# Patient Record
Sex: Male | Born: 1950 | Race: White | Hispanic: No | Marital: Married | Smoking: Never smoker
Health system: Southern US, Community
[De-identification: ages and names within clinical notes are randomized; demographics above are authoritative.]

## PROBLEM LIST (undated history)

## (undated) ENCOUNTER — Ambulatory Visit: Admission: EM | Source: Home / Self Care

## (undated) DIAGNOSIS — Z860100 Personal history of colon polyps, unspecified: Secondary | ICD-10-CM

## (undated) DIAGNOSIS — Z8619 Personal history of other infectious and parasitic diseases: Secondary | ICD-10-CM

## (undated) DIAGNOSIS — Z87442 Personal history of urinary calculi: Secondary | ICD-10-CM

## (undated) DIAGNOSIS — E119 Type 2 diabetes mellitus without complications: Secondary | ICD-10-CM

## (undated) DIAGNOSIS — Q5310 Unspecified undescended testicle, unilateral: Secondary | ICD-10-CM

## (undated) DIAGNOSIS — R892 Abnormal level of other drugs, medicaments and biological substances in specimens from other organs, systems and tissues: Secondary | ICD-10-CM

## (undated) DIAGNOSIS — G629 Polyneuropathy, unspecified: Secondary | ICD-10-CM

## (undated) DIAGNOSIS — M1711 Unilateral primary osteoarthritis, right knee: Secondary | ICD-10-CM

## (undated) DIAGNOSIS — N529 Male erectile dysfunction, unspecified: Secondary | ICD-10-CM

## (undated) DIAGNOSIS — E785 Hyperlipidemia, unspecified: Secondary | ICD-10-CM

## (undated) DIAGNOSIS — E039 Hypothyroidism, unspecified: Secondary | ICD-10-CM

## (undated) DIAGNOSIS — I444 Left anterior fascicular block: Secondary | ICD-10-CM

## (undated) DIAGNOSIS — K219 Gastro-esophageal reflux disease without esophagitis: Secondary | ICD-10-CM

## (undated) HISTORY — DX: Personal history of other infectious and parasitic diseases: Z86.19

## (undated) HISTORY — DX: Abnormal level of other drugs, medicaments and biological substances in specimens from other organs, systems and tissues: R89.2

## (undated) HISTORY — DX: Hyperlipidemia, unspecified: E78.5

## (undated) HISTORY — PX: MENISCUS REPAIR: SHX5179

## (undated) HISTORY — DX: Type 2 diabetes mellitus without complications: E11.9

## (undated) HISTORY — DX: Hypothyroidism, unspecified: E03.9

## (undated) HISTORY — DX: Personal history of colon polyps, unspecified: Z86.0100

## (undated) HISTORY — DX: Polyneuropathy, unspecified: G62.9

## (undated) HISTORY — DX: Unspecified undescended testicle, unilateral: Q53.10

## (undated) HISTORY — DX: Personal history of colonic polyps: Z86.010

## (undated) HISTORY — PX: COLONOSCOPY: SHX174

---
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
## 2000-09-15 HISTORY — PX: MENISCUS REPAIR: SHX5179

## 2005-05-02 ENCOUNTER — Ambulatory Visit: Payer: Self-pay | Admitting: Orthopaedic Surgery

## 2005-06-20 ENCOUNTER — Other Ambulatory Visit: Payer: Self-pay

## 2005-06-27 ENCOUNTER — Ambulatory Visit: Payer: Self-pay | Admitting: Orthopaedic Surgery

## 2006-05-06 ENCOUNTER — Emergency Department: Payer: Self-pay | Admitting: Emergency Medicine

## 2006-05-10 ENCOUNTER — Emergency Department: Payer: Self-pay | Admitting: Emergency Medicine

## 2006-09-15 HISTORY — PX: MENISCUS REPAIR: SHX5179

## 2008-07-10 ENCOUNTER — Ambulatory Visit: Payer: Self-pay | Admitting: Gastroenterology

## 2008-12-29 ENCOUNTER — Ambulatory Visit: Payer: Self-pay | Admitting: Specialist

## 2009-01-04 ENCOUNTER — Ambulatory Visit: Payer: Self-pay | Admitting: Specialist

## 2013-10-11 LAB — FOLATE

## 2013-10-11 LAB — VITAMIN B12: VITAMIN B12 DEFICIENCY PANEL: 572

## 2013-10-16 LAB — HM DIABETES EYE EXAM

## 2013-10-26 ENCOUNTER — Ambulatory Visit (INDEPENDENT_AMBULATORY_CARE_PROVIDER_SITE_OTHER): Payer: Medicare HMO | Admitting: Endocrinology

## 2013-10-26 ENCOUNTER — Encounter: Payer: Self-pay | Admitting: Endocrinology

## 2013-10-26 VITALS — BP 116/70 | HR 48 | Temp 98.3°F | Ht 64.5 in | Wt 178.0 lb

## 2013-10-26 DIAGNOSIS — E1169 Type 2 diabetes mellitus with other specified complication: Secondary | ICD-10-CM | POA: Insufficient documentation

## 2013-10-26 DIAGNOSIS — M25569 Pain in unspecified knee: Secondary | ICD-10-CM

## 2013-10-26 DIAGNOSIS — E1165 Type 2 diabetes mellitus with hyperglycemia: Secondary | ICD-10-CM

## 2013-10-26 DIAGNOSIS — E119 Type 2 diabetes mellitus without complications: Secondary | ICD-10-CM

## 2013-10-26 DIAGNOSIS — E039 Hypothyroidism, unspecified: Secondary | ICD-10-CM

## 2013-10-26 LAB — BASIC METABOLIC PANEL
BUN: 17 mg/dL (ref 6–23)
CO2: 29 mEq/L (ref 19–32)
CREATININE: 0.76 mg/dL (ref 0.50–1.35)
Calcium: 9.8 mg/dL (ref 8.4–10.5)
Chloride: 104 mEq/L (ref 96–112)
GLUCOSE: 124 mg/dL — AB (ref 70–99)
POTASSIUM: 4.7 meq/L (ref 3.5–5.3)
Sodium: 140 mEq/L (ref 135–145)

## 2013-10-26 LAB — HEMOGLOBIN A1C
Hgb A1c MFr Bld: 6.5 % — ABNORMAL HIGH (ref <5.7)
Mean Plasma Glucose: 140 mg/dL — ABNORMAL HIGH (ref <117)

## 2013-10-26 NOTE — Patient Instructions (Addendum)
good diet and exercise habits significanly improve the control of your diabetes.  please let me know if you wish to be referred to a dietician.  high blood sugar is very risky to your health.  you should see an eye doctor every year.  You are at higher than average risk for pneumonia and hepatitis-B.  You should be vaccinated against both.   controlling your blood pressure and cholesterol drastically reduces the damage diabetes does to your body.  this also applies to quitting smoking.  please discuss these with your doctor.  check your blood sugar twice a day.  vary the time of day when you check, between before the 3 meals, and at bedtime.  also check if you have symptoms of your blood sugar being too high or too low.  please keep a record of the readings and bring it to your next appointment here.  You can write it on any piece of paper.  please call us sooner if your blood sugar goes below 70, or if you have a lot of readings over 200. blood tests are being requested for you today.  We'll contact you with results.  Please come back for a follow-up appointment in 3 months.

## 2013-10-26 NOTE — Progress Notes (Signed)
   Subjective:    Patient ID: Kevin Ottaviano., male    DOB: September 06, 1951, 63 y.o.   MRN: [id number]  HPI pt states DM was dx'ed in 2001; he has moderate neuropathy of the lower extremities; he is unaware of any associated chronic complications.  he has never been on insulin.  pt says his diet and exercise are good.  He took metformin for a few years, but he stopped it a few years ago, due to improved a1c.  he brings a record of his cbg's which i have reviewed today.  It varies from 100-150. He was noted a few weeks ago to have elevated TSH, and was rx'ed synthroid.   No past medical history on file.  No past surgical history on file.  History   Social History  . Marital Status: Married    Spouse Name: N/A    Number of Children: N/A  . Years of Education: N/A   Occupational History  . Not on file.   Social History Main Topics  . Smoking status: Never Smoker   . Smokeless tobacco: Not on file  . Alcohol Use: Yes  . Drug Use: Not on file  . Sexual Activity: Not on file   Other Topics Concern  . Not on file   Social History Narrative  . No narrative on file    No current outpatient prescriptions on file prior to visit.   No current facility-administered medications on file prior to visit.    No Known Allergies  No family history on file. DM: both parents and sister Hypothyroidism: sister BP 116/70  Pulse 48  Temp(Src) 98.3 F (36.8 C) (Oral)  Ht 5' 4.5" (1.638 m)  Wt 178 lb (80.74 kg)  BMI 30.09 kg/m2  SpO2 98%  Review of Systems He has pain at the lower legs, but no numbness.  denies weight loss, blurry vision, headache, chest pain, sob, n/v, urinary frequency, excessive diaphoresis, memory loss, depression, rhinorrhea, and easy bruising.  He has cold intolerance.      Objective:   Physical Exam VS: see vs page GEN: no distress HEAD: head: no deformity eyes: no periorbital swelling, no proptosis external nose and ears are normal mouth: no lesion seen NECK:  supple, thyroid is not enlarged CHEST WALL: no deformity LUNGS: clear to auscultation BREASTS:  No gynecomastia CV: reg rate and rhythm, no murmur ABD: abdomen is soft, nontender.  no hepatosplenomegaly.  not distended.  no hernia. MUSCULOSKELETAL: muscle bulk and strength are grossly normal.  no obvious joint swelling.  gait is normal and steady PULSES: no carotid bruit NEURO:  cn 2-12 grossly intact.   readily moves all 4's.  SKIN:  Normal texture and temperature.  No rash or suspicious lesion is visible.   NODES:  None palpable at the neck PSYCH: alert, well-oriented.  Does not appear anxious nor depressed.  Lab Results  Component Value Date   HGBA1C 6.5* 10/26/2013   Lab Results  Component Value Date   TSH 1.874 10/26/2013      Assessment & Plan:  Type 2 DM: well-controlled. Hypothyroidism, on rx.  He'll need TSH rechecked at next ov, as he has only been on synthroid for a few weeks. Leg pain, ? Neuropathic.  i ordered PNCV's

## 2013-10-27 LAB — TSH: TSH: 1.874 u[IU]/mL (ref 0.350–4.500)

## 2013-12-07 ENCOUNTER — Ambulatory Visit (INDEPENDENT_AMBULATORY_CARE_PROVIDER_SITE_OTHER): Payer: Medicare HMO | Admitting: Neurology

## 2013-12-07 ENCOUNTER — Encounter: Payer: Self-pay | Admitting: Neurology

## 2013-12-07 VITALS — BP 112/78 | HR 56 | Ht 64.57 in | Wt 178.2 lb

## 2013-12-07 DIAGNOSIS — E1142 Type 2 diabetes mellitus with diabetic polyneuropathy: Secondary | ICD-10-CM

## 2013-12-07 DIAGNOSIS — G629 Polyneuropathy, unspecified: Secondary | ICD-10-CM | POA: Insufficient documentation

## 2013-12-07 DIAGNOSIS — G609 Hereditary and idiopathic neuropathy, unspecified: Secondary | ICD-10-CM

## 2013-12-07 DIAGNOSIS — E1149 Type 2 diabetes mellitus with other diabetic neurological complication: Secondary | ICD-10-CM

## 2013-12-07 DIAGNOSIS — E1140 Type 2 diabetes mellitus with diabetic neuropathy, unspecified: Secondary | ICD-10-CM

## 2013-12-07 HISTORY — DX: Polyneuropathy, unspecified: G62.9

## 2013-12-07 MED ORDER — GABAPENTIN 300 MG PO CAPS: 300.0000 mg | ORAL_CAPSULE | Freq: Every day | ORAL | Status: DC

## 2013-12-07 NOTE — Progress Notes (Signed)
[facility] Clinic Note - Initial Visit   Date: 12/07/2013    Kevin Sheppard. MRN: [id number] DOB: 03/29/1951   Dear Dr Rutherford Nail:  Thank you for your kind referral of Kevin Sheppard. for consultation of paresthesias of his feet. Although his history is well known to you, please allow [place] to reiterate it for the purpose of our medical record. The patient was accompanied to the clinic by self.    History of Present Illness: Kevin Pantaleo. is a 63 y.o. right-handed Hispanic male with history of diabetes mellitus (diagnosed 2001, HbA1c 6.5) and hypothyroidism (diagnosed Jan 2015) presenting for evaluation of abnormal sensation of the skin.    Starting in December 2014, he developed prickly sensation over the lower leg and went to his primary care provider in January who started him on Lyrica 50mg  BID for possible diabetic neuropathy.  He tried it for 2-3 weeks, but developed a rash over his chest so stopped it.  The prickly sensation is intermittent and worse when he is resting and improved by activity such as running.  He is physically very active and enjoys swimming, running, and hiking.  Denies back pain, dry mouth, dry eyes, problems with sweating, or erectile dysfunction.  He was also diagnosed with hypothyroidism this year and feels that after starting synthroid the discomfort has improved.    Out-side paper records, electronic medical record, and images have been reviewed where available and summarized as:  Lab Results  Component Value Date   HGBA1C 6.5* 10/26/2013   Lab Results  Component Value Date   TSH 1.874 10/26/2013     Past Medical History  Diagnosis Date  . Diabetes   . Hypothyroid   . Diabetic neuropathy     Past Surgical History  Procedure Laterality Date  . Meniscus repair       Medications:  Current Outpatient Prescriptions on File Prior to Visit  Medication Sig Dispense Refill  . ibuprofen (ADVIL,MOTRIN) 800 MG tablet         . levothyroxine (SYNTHROID, LEVOTHROID) 100 MCG tablet       . traMADol (ULTRAM) 50 MG tablet       . zolpidem (AMBIEN CR) 12.5 MG CR tablet        No current facility-administered medications on file prior to visit.    Allergies: No Known Allergies  Family History: Family History  Problem Relation Age of Onset  . Stroke Father   . Other Mother     blood disease    Social History: History   Social History  . Marital Status: Married    Spouse Name: N/A    Number of Children: 4  . Years of Education: N/A   Occupational History  . line adjuster    Social History Main Topics  . Smoking status: Never Smoker   . Smokeless tobacco: Not on file  . Alcohol Use: Yes  . Drug Use: Not on file  . Sexual Activity: Not on file   Other Topics Concern  . Not on file   Social History Narrative  . No narrative on file    Review of Systems:  CONSTITUTIONAL: No fevers, chills, night sweats, or weight loss.   EYES: No visual changes or eye pain ENT: No hearing changes.  No history of nose bleeds.   RESPIRATORY: No cough, wheezing and shortness of breath.   CARDIOVASCULAR: Negative for chest pain, and palpitations.   GI: Negative for abdominal discomfort, blood in stools or  black stools.  No recent change in bowel habits.   GU:  No history of incontinence.   MUSCLOSKELETAL: No history of joint pain or swelling.  No myalgias.   SKIN: Negative for lesions, rash, and itching.   HEMATOLOGY/ONCOLOGY: Negative for prolonged bleeding, bruising easily, and swollen nodes.    ENDOCRINE: Negative for cold or heat intolerance, polydipsia or goiter.   PSYCH: No  depression or anxiety symptoms.   NEURO: As Above.   Vital Signs:  BP 112/78  Pulse 56  Ht 5' 4.57" (1.64 m)  Wt 178 lb 3 oz (80.825 kg)  BMI 30.05 kg/m2  SpO2 96% Pain Scale: 3 on a scale of 0-10   General Medical Exam:   General:  Well appearing, comfortable.   Eyes/ENT: see cranial nerve examination.   Neck: No masses  appreciated.  Full range of motion without tenderness.  No carotid bruits. Respiratory:  Clear to auscultation, good air entry bilaterally.   Cardiac:  Regular rate and rhythm, no murmur.   Back:  No pain to palpation of spinous processes.   Extremities:  No deformities, edema, or skin discoloration. Good capillary refill.   Skin:  Few areas of erythematous skin lesions on the chest wall  Neurological Exam: MENTAL STATUS including orientation to time, place, person, recent and remote memory, attention span and concentration, language, and fund of knowledge is normal.  Speech is not dysarthric.  CRANIAL NERVES: II:  No visual field defects.  Unremarkable fundi.   III-IV-VI: Pupils equal round and reactive to light.  Normal conjugate, extra-ocular eye movements in all directions of gaze.  No nystagmus.  No ptosis.   V:  Normal facial sensation.   VII:  Normal facial symmetry and movements.   VIII:  Normal hearing and vestibular function.   IX-X:  Normal palatal movement.   XI:  Normal shoulder shrug and head rotation.   XII:  Normal tongue strength and range of motion, no deviation or fasciculation.  MOTOR:  No atrophy, fasciculations or abnormal movements.  No pronator drift.  Tone is normal.    Right Upper Extremity:    Left Upper Extremity:    Deltoid  5/5   Deltoid  5/5   Biceps  5/5   Biceps  5/5   Triceps  5/5   Triceps  5/5   Wrist extensors  5/5   Wrist extensors  5/5   Wrist flexors  5/5   Wrist flexors  5/5   Finger extensors  5/5   Finger extensors  5/5   Finger flexors  5/5   Finger flexors  5/5   Dorsal interossei  5/5   Dorsal interossei  5/5   Abductor pollicis  5/5   Abductor pollicis  5/5   Tone (Ashworth scale)  0  Tone (Ashworth scale)  0   Right Lower Extremity:    Left Lower Extremity:    Hip flexors  5/5   Hip flexors  5/5   Hip extensors  5/5   Hip extensors  5/5   Knee flexors  5/5   Knee flexors  5/5   Knee extensors  5/5   Knee extensors  5/5     Dorsiflexors  5/5   Dorsiflexors  5/5   Plantarflexors  5/5   Plantarflexors  5/5   Toe extensors  5/5   Toe extensors  5/5   Toe flexors  5/5   Toe flexors  5/5   Tone (Ashworth scale)  0  Tone (Ashworth scale)  0   MSRs:  Right                                                                 Left brachioradialis 2+  brachioradialis 2+  biceps 2+  biceps 2+  triceps 2+  triceps 2+  patellar 2+  patellar 2+  ankle jerk 2+  ankle jerk 2+  Hoffman no  Hoffman no  plantar response down  plantar response down   SENSORY:  Subtle reduction in vibration at the great toe bilaterally, otherwise normal and symmetric perception of light touch, pinprick, vibration, and proprioception.  Romberg's sign absent.   COORDINATION/GAIT: Normal finger-to- nose-finger and heel-to-shin.  Intact rapid alternating movements bilaterally.  Able to rise from a chair without using arms.  Gait narrow based and stable. Tandem and stressed gait intact.    IMPRESSION/PLAN: Kevin Sheppard is a 63 year-old gentleman presenting for evaluation of bilateral leg parethesias.  His neurological examination is largely non-focal, except a slightly reduction in vibration at the great toe bilaterally.  His history is slightly atypical such that symptoms do not involve his feet, but and exam is suggestive of early signs of neuropathy. In addition to having well-controlled diabetes, he was recently diagnosed with hypothyroidism which can also contribute to neuropathy.  Since symptoms have slightly improved after being started on synthroid, I will hold off on checking for other potential causes of neuropathy and see how his symptoms evolve over time.  For symptomatic control, I will start him on gabapentin 300mg  qhs.  If going forward his symptoms are worsening, EMG and additional lab work can be considered.  I will see him back in 57-months.  The duration of this appointment visit was 35 minutes of face-to-face time with the patient.  Greater  than 50% of this time was spent in counseling, explanation of diagnosis, planning of further management, and coordination of care.   Thank you for allowing me to participate in patient's care.  If I can answer any additional questions, I would be pleased to do so.    Sincerely,    Donika K. Posey Pronto, DO

## 2013-12-07 NOTE — Progress Notes (Signed)
Done

## 2013-12-07 NOTE — Patient Instructions (Signed)
1.  Start gabapentin 300mg  at bedtime 2.  Return to clinic in 43-months

## 2014-01-30 ENCOUNTER — Ambulatory Visit (INDEPENDENT_AMBULATORY_CARE_PROVIDER_SITE_OTHER): Payer: Medicare HMO | Admitting: Endocrinology

## 2014-01-30 ENCOUNTER — Encounter: Payer: Self-pay | Admitting: Endocrinology

## 2014-01-30 VITALS — BP 120/76 | HR 46 | Temp 97.9°F | Ht 64.5 in | Wt 178.0 lb

## 2014-01-30 DIAGNOSIS — E039 Hypothyroidism, unspecified: Secondary | ICD-10-CM

## 2014-01-30 DIAGNOSIS — E119 Type 2 diabetes mellitus without complications: Secondary | ICD-10-CM

## 2014-01-30 LAB — TSH: TSH: 1.05 u[IU]/mL (ref 0.35–4.50)

## 2014-01-30 LAB — HEMOGLOBIN A1C: HEMOGLOBIN A1C: 6.6 % — AB (ref 4.6–6.5)

## 2014-01-30 NOTE — Progress Notes (Signed)
Subjective:    Patient ID: Kevin Sheppard., male    DOB: 1951-08-19, 63 y.o.   MRN: [id number]  HPI pt returns for f/u of type 2 DM (dx'ed 2001; he has moderate neuropathy of the lower extremities; he is unaware of any associated chronic complications; he has never been on insulin).  no cbg record, but states cbg's are well-controlled. He was noted in early 2015 to have elevated TSH, and was rx'ed synthroid.  pt states he feels well in general.   Past Medical History  Diagnosis Date  . Diabetes   . Hypothyroid   . Diabetic neuropathy     Past Surgical History  Procedure Laterality Date  . Meniscus repair      History   Social History  . Marital Status: Married    Spouse Name: N/A    Number of Children: 4  . Years of Education: N/A   Occupational History  . line adjuster    Social History Main Topics  . Smoking status: Never Smoker   . Smokeless tobacco: Not on file  . Alcohol Use: Yes     Comment: Socially  . Drug Use: No  . Sexual Activity: Not on file   Other Topics Concern  . Not on file   Social History Narrative   He works on cigarette machines.   He lives with wife.  They have 4 children.          Current Outpatient Prescriptions on File Prior to Visit  Medication Sig Dispense Refill  . gabapentin (NEURONTIN) 300 MG capsule Take 1 capsule (300 mg total) by mouth at bedtime.  30 capsule  3  . ibuprofen (ADVIL,MOTRIN) 800 MG tablet       . levothyroxine (SYNTHROID, LEVOTHROID) 100 MCG tablet       . metFORMIN (GLUCOPHAGE) 500 MG tablet Take by mouth 2 (two) times daily with a meal.      . traMADol (ULTRAM) 50 MG tablet       . zolpidem (AMBIEN CR) 12.5 MG CR tablet        No current facility-administered medications on file prior to visit.    No Known Allergies  Family History  Problem Relation Age of Onset  . Stroke Father   . Other Mother     blood disease, died 19 (?porphyria)  . Diabetes type II Brother   . Diabetes type II Maternal Aunt       BP 120/76  Pulse 46  Temp(Src) 97.9 F (36.6 C) (Oral)  Ht 5' 4.5" (1.638 m)  Wt 178 lb (80.74 kg)  BMI 30.09 kg/m2  SpO2 96%      Review of Systems He denies weight change and hypoglycemia    Objective:   Physical Exam VITAL SIGNS:  See vs page GENERAL: no distress Pulses: dorsalis pedis intact bilat.   Feet: no deformity. normal color and temp.  no edema Skin:  no ulcer on the feet.   Neuro: sensation is intact to touch on the feet   Lab Results  Component Value Date   HGBA1C 6.6* 01/30/2014   Lab Results  Component Value Date   TSH 1.05 01/30/2014       Assessment & Plan:  DM: well-controlled Hypothyroidism: well-controlled    Patient Instructions  check your blood sugar once a day.  vary the time of day when you check, between before the 3 meals, and at bedtime.  also check if you have symptoms of your blood sugar being  too high or too low.  please keep a record of the readings and bring it to your next appointment here.  You can write it on any piece of paper.  please call us sooner if your blood sugar goes below 70, or if you have a lot of readings over 200. Diabetes and thyroid blood tests are being requested for you today.  We'll contact you with results.  Please come back for a follow-up appointment in 6 months.

## 2014-01-30 NOTE — Patient Instructions (Addendum)
check your blood sugar once a day.  vary the time of day when you check, between before the 3 meals, and at bedtime.  also check if you have symptoms of your blood sugar being too high or too low.  please keep a record of the readings and bring it to your next appointment here.  You can write it on any piece of paper.  please call us sooner if your blood sugar goes below 70, or if you have a lot of readings over 200. Diabetes and thyroid blood tests are being requested for you today.  We'll contact you with results.  Please come back for a follow-up appointment in 6 months.

## 2014-04-05 ENCOUNTER — Other Ambulatory Visit: Payer: Self-pay | Admitting: Neurology

## 2014-04-05 NOTE — Telephone Encounter (Signed)
Rx sent 

## 2014-04-10 ENCOUNTER — Ambulatory Visit (INDEPENDENT_AMBULATORY_CARE_PROVIDER_SITE_OTHER): Payer: Medicare HMO | Admitting: Neurology

## 2014-04-10 ENCOUNTER — Encounter: Payer: Self-pay | Admitting: Neurology

## 2014-04-10 VITALS — BP 96/58 | HR 64 | Resp 14 | Ht 65.0 in | Wt 179.0 lb

## 2014-04-10 DIAGNOSIS — E079 Disorder of thyroid, unspecified: Secondary | ICD-10-CM

## 2014-04-10 DIAGNOSIS — G63 Polyneuropathy in diseases classified elsewhere: Principal | ICD-10-CM

## 2014-04-10 DIAGNOSIS — E349 Endocrine disorder, unspecified: Secondary | ICD-10-CM

## 2014-04-10 DIAGNOSIS — G589 Mononeuropathy, unspecified: Secondary | ICD-10-CM

## 2014-04-10 NOTE — Progress Notes (Signed)
Follow-up Visit   Date: 04/10/2014   Kevin Sheppard. MRN: [id number] DOB: 09-11-1951   Interim History: Kevin Sheppard. is a 63 y.o. right-handed Hispanic male with history of diabetes mellitus (diagnosed 2001, HbA1c 6.5) and hypothyroidism (diagnosed Jan 2015) returning to the clinic for follow-up of diabetic neuropathy.  The patient was accompanied to the clinic by self. He was last seen in the clinic on 12/07/2013.  History of present illness: Starting in December 2014, he developed prickly sensation over the lower leg and went to his primary care provider in January who started him on Lyrica 50mg  BID for possible diabetic neuropathy. He tried it for 2-3 weeks, but developed a rash over his chest so stopped it. The prickly sensation is intermittent and worse when he is resting and improved by activity such as running. He is physically very active and enjoys swimming, running, and hiking. Denies back pain, dry mouth, dry eyes, problems with sweating, or erectile dysfunction. He was also diagnosed with hypothyroidism this year and feels that after starting synthroid the discomfort has improved.   UPDATE 04/10/2014:  At his last visit, he was started on gabapentin 300mg  at bedtime which helped his symptoms, but eventually symptoms starting improving so he self-tapered.  He attritbutes most of his improvement to starting thyroid medication.  He has a brief spell about a month ago, but nothing since then.   Medications:  Current Outpatient Prescriptions on File Prior to Visit  Medication Sig Dispense Refill  . ibuprofen (ADVIL,MOTRIN) 800 MG tablet       . levothyroxine (SYNTHROID, LEVOTHROID) 100 MCG tablet       . metFORMIN (GLUCOPHAGE) 500 MG tablet Take by mouth 2 (two) times daily with a meal.      . traMADol (ULTRAM) 50 MG tablet       . zolpidem (AMBIEN CR) 12.5 MG CR tablet        No current facility-administered medications on file prior to visit.    Allergies: No Known  Allergies   Review of Systems:  CONSTITUTIONAL: No fevers, chills, night sweats, or weight loss.   EYES: No visual changes or eye pain ENT: No hearing changes.  No history of nose bleeds.   RESPIRATORY: No cough, wheezing and shortness of breath.   CARDIOVASCULAR: Negative for chest pain, and palpitations.   GI: Negative for abdominal discomfort, blood in stools or black stools.  No recent change in bowel habits.   GU:  No history of incontinence.   MUSCLOSKELETAL: No history of joint pain or swelling.  No myalgias.   SKIN: Negative for lesions, rash, and itching.   ENDOCRINE: Negative for cold or heat intolerance, polydipsia or goiter.   PSYCH:  No depression or anxiety symptoms.   NEURO: As Above.   Vital Signs:  BP 96/58  Pulse 64  Resp 14  Ht 5\' 5"  (1.651 m)  Wt 179 lb (81.194 kg)  BMI 29.79 kg/m2  Neurological Exam: MENTAL STATUS including orientation to time, place, person, recent and remote memory, attention span and concentration, language, and fund of knowledge is normal.  Speech is not dysarthric.  CRANIAL NERVES:  Pupils equal round and reactive to light.  Normal conjugate, extra-ocular eye movements in all directions of gaze. . Face is symmetric.   MOTOR:  Motor strength is 5/5 in all extremities.    MSRs:  Reflexes are 2+/4 throughout.  SENSORY:  Intact to intact to pin prick and temperature throughout.  COORDINATION/GAIT:  Gait narrow  based and stable.   Data: Lab Results  Component Value Date   TSH 1.05 01/30/2014   Lab Results  Component Value Date   HGBA1C 6.6* 01/30/2014     IMPRESSION/PLAN: 1.  Very early peripheral neuropathy, likely due to hypothyroidism.   - Clinically improved after starting synthroid.  Exam is normal, sensation preserved throughout.  - Other risk factors include controlled diabetes.     - His symptoms are now infrequent for daily dosing of gabapentin, ok to use gabapentin 300mg  as needed for severe pain.  - Encouraged tight  glycemic control to prevent progression of neuropathy  2.  His blood pressure is slightly low today, but he is asymptomatic.  Encouraged him to stay well-hydrated.   3.  Return to clinic as needed.   The duration of this appointment visit was 18 minutes of face-to-face time with the patient.  Greater than 50% of this time was spent in counseling, explanation of diagnosis, planning of further management, and coordination of care.   Thank you for allowing me to participate in patient's care.  If I can answer any additional questions, I would be pleased to do so.    Sincerely,    Donika K. Posey Pronto, DO

## 2014-04-19 ENCOUNTER — Telehealth: Payer: Self-pay

## 2014-04-19 NOTE — Telephone Encounter (Signed)
Diabetic Bundle. On 08/04/2014 with Dr. Loanne Drilling.

## 2014-05-15 ENCOUNTER — Telehealth: Payer: Self-pay | Admitting: Endocrinology

## 2014-05-15 NOTE — Telephone Encounter (Signed)
Pt has questions regarding changing diabetes med

## 2014-05-15 NOTE — Telephone Encounter (Signed)
Requested call back to discuss.  

## 2014-05-15 NOTE — Telephone Encounter (Signed)
You could double the metformin, or add another med.  Your choice.  Please let me know. For a new dr, go to [email or web address], and choose a location near you.  There are male drs at all sites.

## 2014-05-15 NOTE — Telephone Encounter (Signed)
Pt called back. Pt wanted to discuss adding diabetic medication. Pt states that at last ov on 01/30/2014 after he had his lab work done it was sugested that he should consider adding another dm medication. At the time the pt declined new medication. Now pt is wanting to try the medication. Pt states the reason is because he has decreased libido. Pt has noticed this for a few months now. Also, pt wanted to know if you could recommend any male PCP's. Pt is going to have to change providers due to current PCP retiring and would like to stay within our system.

## 2014-05-16 NOTE — Telephone Encounter (Signed)
Requested call back to discuss.  

## 2014-05-16 NOTE — Telephone Encounter (Signed)
Called pt and he states that he would like to come off the metformin altogether and try something else because of the effects the medication is having on his Libido.  Please advise, Thanks!

## 2014-05-17 MED ORDER — SITAGLIPTIN PHOSPHATE 100 MG PO TABS: 100.0000 mg | ORAL_TABLET | Freq: Every day | ORAL | Status: DC

## 2014-05-17 NOTE — Telephone Encounter (Signed)
Requested call back to discuss.  

## 2014-05-17 NOTE — Telephone Encounter (Signed)
Ok, i have sent a prescription to your pharmacy for a different medication. i'll see you next time.

## 2014-05-17 NOTE — Telephone Encounter (Signed)
Pt advised of new medication.

## 2014-05-17 NOTE — Addendum Note (Signed)
Addended by: Renato Shin on: 05/17/2014 07:33 AM   Modules accepted: Orders

## 2014-06-06 LAB — COMPREHENSIVE METABOLIC PANEL
ALT: 31
AST: 25 U/L
Albumin: 4.8
Alkaline Phosphatase: 90 U/L
BILIRUBIN TOTAL: 0.6 mg/dL
BUN: 16 mg/dL (ref 4–21)
Calcium: 9.7 mg/dL
Creat: 0.85
GLUCOSE: 140 mg/dL
POTASSIUM: 4.6 mmol/L
Sodium: 146 mmol/L (ref 137–147)
Total Protein: 7.1 g/dL

## 2014-06-06 LAB — LIPID PANEL
Cholesterol, Total: 240
HDL: 52 mg/dL (ref 35–70)
LDL (calc): 166
Triglycerides: 108

## 2014-06-06 LAB — CBC
HGB: 16 g/dL
WBC: 5.2
platelet count: 206

## 2014-06-06 LAB — PSA: PSA: 0.6

## 2014-06-06 LAB — TSH: TSH: 1.36

## 2014-08-04 ENCOUNTER — Encounter: Payer: Self-pay | Admitting: Endocrinology

## 2014-08-04 ENCOUNTER — Ambulatory Visit (INDEPENDENT_AMBULATORY_CARE_PROVIDER_SITE_OTHER): Payer: Medicare HMO | Admitting: Endocrinology

## 2014-08-04 VITALS — BP 126/82 | HR 59 | Temp 98.2°F | Ht 65.0 in | Wt 178.0 lb

## 2014-08-04 DIAGNOSIS — E119 Type 2 diabetes mellitus without complications: Secondary | ICD-10-CM

## 2014-08-04 DIAGNOSIS — E039 Hypothyroidism, unspecified: Secondary | ICD-10-CM

## 2014-08-04 LAB — LIPID PANEL
Cholesterol: 177 mg/dL (ref 0–200)
HDL: 40.4 mg/dL (ref >39.00)
LDL Cholesterol: 121 mg/dL — ABNORMAL HIGH (ref 0–99)
NonHDL: 136.6
Total CHOL/HDL Ratio: 4
Triglycerides: 78 mg/dL (ref 0.0–149.0)
VLDL: 15.6 mg/dL (ref 0.0–40.0)

## 2014-08-04 LAB — BASIC METABOLIC PANEL
BUN: 23 mg/dL (ref 6–23)
CHLORIDE: 106 meq/L (ref 96–112)
CO2: 23 mEq/L (ref 19–32)
CREATININE: 0.8 mg/dL (ref 0.4–1.5)
Calcium: 9.4 mg/dL (ref 8.4–10.5)
GFR: 98.35 mL/min (ref >60.00)
GLUCOSE: 128 mg/dL — AB (ref 70–99)
POTASSIUM: 3.9 meq/L (ref 3.5–5.1)
Sodium: 139 mEq/L (ref 135–145)

## 2014-08-04 LAB — TSH: TSH: 1.48 u[IU]/mL (ref 0.35–4.50)

## 2014-08-04 LAB — MICROALBUMIN / CREATININE URINE RATIO
Creatinine,U: 99.8 mg/dL
MICROALB UR: 0.6 mg/dL (ref 0.0–1.9)
Microalb Creat Ratio: 0.6 mg/g (ref 0.0–30.0)

## 2014-08-04 LAB — HEMOGLOBIN A1C: Hgb A1c MFr Bld: 6.6 % — ABNORMAL HIGH (ref 4.6–6.5)

## 2014-08-04 NOTE — Patient Instructions (Addendum)
check your blood sugar once a day.  vary the time of day when you check, between before the 3 meals, and at bedtime.  also check if you have symptoms of your blood sugar being too high or too low.  please keep a record of the readings and bring it to your next appointment here.  You can write it on any piece of paper.  please call us sooner if your blood sugar goes below 70, or if you have a lot of readings over 200. blood tests are being requested for you today.  We'll contact you with results.  Please see Dr Danise Mina as scheduled. I would be happy to see you back here whenever you want.

## 2014-08-04 NOTE — Progress Notes (Signed)
Subjective:    Patient ID: Kevin Sheppard., male    DOB: [birth year], 63 y.o.   MRN: [id number]  HPI  Pt returns for f/u of diabetes mellitus: DM type: 2 Dx'ed: 1751 Complications: none Therapy: [place] DKA: never Severe hypoglycemia: never Pancreatitis: never Other: pt says metformin caused ED sxs; he has never been on insulin Interval history: pt states he feels well in general.  no cbg record, but states cbg's are well-controlled.  Past Medical History  Diagnosis Date  . Diabetes   . Hypothyroid   . Diabetic neuropathy     Past Surgical History  Procedure Laterality Date  . Meniscus repair      History   Social History  . Marital Status: Married    Spouse Name: N/A    Number of Children: 4  . Years of Education: N/A   Occupational History  . line adjuster    Social History Main Topics  . Smoking status: Never Smoker   . Smokeless tobacco: Not on file  . Alcohol Use: Yes     Comment: Socially  . Drug Use: No  . Sexual Activity: Not on file   Other Topics Concern  . Not on file   Social History Narrative   He works on cigarette machines.   He lives with wife.  They have 4 children.          Current Outpatient Prescriptions on File Prior to Visit  Medication Sig Dispense Refill  . ibuprofen (ADVIL,MOTRIN) 800 MG tablet     . levothyroxine (SYNTHROID, LEVOTHROID) 100 MCG tablet     . sitaGLIPtin (JANUVIA) 100 MG tablet Take 1 tablet (100 mg total) by mouth daily. 30 tablet 11  . traMADol (ULTRAM) 50 MG tablet     . zolpidem (AMBIEN CR) 12.5 MG CR tablet      No current facility-administered medications on file prior to visit.    No Known Allergies  Family History  Problem Relation Age of Onset  . Stroke Father   . Other Mother     blood disease, died 46 (?porphyria)  . Diabetes type II Brother   . Diabetes type II Maternal Aunt     BP 126/82 mmHg  Pulse 59  Temp(Src) 98.2 F (36.8 C) (Oral)  Ht 5\' 5"  (1.651 m)  Wt 178 lb (80.74 kg)   BMI 29.62 kg/m2  SpO2 93%    Review of Systems Denies weight change and chest pain.      Objective:   Physical Exam VITAL SIGNS:  See vs page GENERAL: no distress Pulses: dorsalis pedis intact bilat.   Feet: no deformity.  no edema Skin:  no ulcer on the feet.  normal color and temp. Neuro: sensation is intact to touch on the feet   Lab Results  Component Value Date   HGBA1C 6.6* 08/04/2014      Assessment & Plan:  DM: well-controlled  Patient is advised the following: Patient Instructions  check your blood sugar once a day.  vary the time of day when you check, between before the 3 meals, and at bedtime.  also check if you have symptoms of your blood sugar being too high or too low.  please keep a record of the readings and bring it to your next appointment here.  You can write it on any piece of paper.  please call us sooner if your blood sugar goes below 70, or if you have a lot of readings over 200. blood tests  are being requested for you today.  We'll contact you with results.  Please see Dr Danise Mina as scheduled. I would be happy to see you back here whenever you want.

## 2014-08-07 ENCOUNTER — Telehealth: Payer: Self-pay | Admitting: Endocrinology

## 2014-08-07 NOTE — Telephone Encounter (Signed)
Patient is returning your call.  

## 2014-08-16 ENCOUNTER — Ambulatory Visit (INDEPENDENT_AMBULATORY_CARE_PROVIDER_SITE_OTHER): Payer: 59 | Admitting: Family Medicine

## 2014-08-16 ENCOUNTER — Encounter: Payer: Self-pay | Admitting: Family Medicine

## 2014-08-16 VITALS — BP 110/70 | HR 60 | Temp 97.9°F | Ht 64.5 in | Wt 180.5 lb

## 2014-08-16 DIAGNOSIS — E119 Type 2 diabetes mellitus without complications: Secondary | ICD-10-CM

## 2014-08-16 DIAGNOSIS — E039 Hypothyroidism, unspecified: Secondary | ICD-10-CM

## 2014-08-16 DIAGNOSIS — Z86010 Personal history of colonic polyps: Secondary | ICD-10-CM

## 2014-08-16 DIAGNOSIS — E785 Hyperlipidemia, unspecified: Secondary | ICD-10-CM

## 2014-08-16 DIAGNOSIS — E1169 Type 2 diabetes mellitus with other specified complication: Secondary | ICD-10-CM | POA: Insufficient documentation

## 2014-08-16 DIAGNOSIS — G4726 Circadian rhythm sleep disorder, shift work type: Secondary | ICD-10-CM | POA: Insufficient documentation

## 2014-08-16 DIAGNOSIS — G629 Polyneuropathy, unspecified: Secondary | ICD-10-CM

## 2014-08-16 DIAGNOSIS — G4700 Insomnia, unspecified: Secondary | ICD-10-CM

## 2014-08-16 NOTE — Assessment & Plan Note (Signed)
Reviewed dietary changes to improve LDL. Recheck in 6 months. Pt hesitant for statin.

## 2014-08-16 NOTE — Assessment & Plan Note (Signed)
Will request records of latest colonoscopy.

## 2014-08-16 NOTE — Assessment & Plan Note (Signed)
Will continue f/u here. Continue levothyroxine.

## 2014-08-16 NOTE — Assessment & Plan Note (Signed)
Stable, well controlled, recent foot exam.  UTD eye exam. Recent labs normal as well. RTC 6 mo f/u visit/CPE. Pt agrees with plan.

## 2014-08-16 NOTE — Progress Notes (Signed)
Pre visit review using our clinic review tool, if applicable. No additional management support is needed unless otherwise documented below in the visit note. 

## 2014-08-16 NOTE — Assessment & Plan Note (Signed)
Shift work insomnia - continue [employer]

## 2014-08-16 NOTE — Patient Instructions (Addendum)
Good to meet you today, return as needed or in summer for physical. Return prior for fasting labs.  Continue working on diet to improve LDL - more whole grains, more legumes (nuts,beans,lentils). We will recheck cholesterol levels next lab. Continue januvia and meds as up to now.

## 2014-08-16 NOTE — Assessment & Plan Note (Signed)
Thought thyroid disease related as this resolved with levothyroxine treatment. Has gabapentin to use prn, doesn't use.

## 2014-08-16 NOTE — Progress Notes (Signed)
BP 110/70 mmHg  Pulse 60  Temp(Src) 97.9 F (36.6 C) (Oral)  Ht 5' 4.5" (1.638 m)  Wt 180 lb 8 oz (81.874 kg)  BMI 30.52 kg/m2   CC: new pt to establish care  Subjective:    Patient ID: Kevin Diodato., male    DOB: 03/02/1951, 63 y.o.   MRN: [id number]  HPI: Kevin Newsham. is a 63 y.o. male presenting on 08/16/2014 for Establish Care   Prior saw Dr. Rutherford Nail. Has seen [place] [facility] for h/o diabetes and hypothyroidism with neuropathy from thyroid disease which has resolved since starting thyroid medication. Wife is Therapist, sports.  Neuropathy - improved with thyroid treatment.  DM - followed by Dr Loanne Drilling. DM 2001 dx. januvia 100mg  daily. Lab Results  Component Value Date   HGBA1C 6.6* 08/04/2014     Hypothyroidism - controlled on levothryoxine 129mcg daily. This was started 10/2012. Sister with thyroid disease. 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 Bedtime is 8am. Wilburn Cornelia 12.5mg  CR daily.  HLD - increasing fish oil and decreasing red meat, wants to avoid statins. Lab Results  Component Value Date   LDLCALC 121* 08/04/2014    Preventative: Last CPE 05/2014 - ok for one per calendar year. Colonoscopy - benign polyps 2004, no polyps 2009 last by Dr Allen Norris in [place] requested. Tetanus recently Flu shot done Pneumovax done  He lives with wife.  2 dogs. They have grown 4 children and 3 grandchildren.  He works on cigarette machines, 3rd shift Activity: goes to gym regularly Diet: good water, fruits/vegetables daily  Relevant past medical, surgical, family and social history reviewed and updated as indicated. Interim medical history since our last visit reviewed. Allergies and medications reviewed and updated.  Current Outpatient Prescriptions on File Prior to Visit  Medication Sig  . ibuprofen (ADVIL,MOTRIN) 800 MG tablet   . levothyroxine (SYNTHROID, LEVOTHROID) 100 MCG tablet     . sitaGLIPtin (JANUVIA) 100 MG tablet Take 1 tablet (100 mg total) by mouth daily.  Marland Kitchen zolpidem (AMBIEN CR) 12.5 MG CR tablet    No current facility-administered medications on file prior to visit.    Review of Systems Per HPI unless specifically indicated above     Objective:    BP 110/70 mmHg  Pulse 60  Temp(Src) 97.9 F (36.6 C) (Oral)  Ht 5' 4.5" (1.638 m)  Wt 180 lb 8 oz (81.874 kg)  BMI 30.52 kg/m2  Wt Readings from Last 3 Encounters:  08/16/14 180 lb 8 oz (81.874 kg)  08/04/14 178 lb (80.74 kg)  04/10/14 179 lb (81.194 kg)    Physical Exam  Constitutional: He appears well-developed and well-nourished. No distress.  HENT:  Mouth/Throat: Oropharynx is clear and moist. No oropharyngeal exudate.  Eyes: Conjunctivae and EOM are normal. Pupils are equal, round, and reactive to light.  Neck: Normal range of motion. Neck supple. No thyromegaly present.  Cardiovascular: Normal rate, regular rhythm, normal heart sounds and intact distal pulses.   No murmur heard. Pulmonary/Chest: Effort normal and breath sounds normal. No respiratory distress. He has no wheezes. He has no rales.  Musculoskeletal: He exhibits no edema.  Lymphadenopathy:    He has no cervical adenopathy.  Skin: Skin is warm and dry. No rash noted.  Psychiatric: He has a normal mood and affect.  Nursing note and vitals reviewed.  Results for orders placed or performed in visit on 08/16/14  HM DIABETES EYE EXAM  Result Value Ref Range   HM Diabetic Eye Exam No Retinopathy No Retinopathy      Assessment & Plan:   Problem List Items Addressed This Visit    Peripheral neuropathy    Thought thyroid disease related as this resolved with levothyroxine treatment. Has gabapentin to use prn, doesn't use.    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 Continue levothyroxine.    HLD  (hyperlipidemia)    Reviewed dietary changes to improve LDL. Recheck in 6 months. Pt hesitant for statin.    History of colon polyps    Will request records of latest colonoscopy.    Diabetes type 2, controlled - Primary    Stable, well controlled, recent foot exam.  UTD eye exam. Recent labs normal as well. RTC 6 mo f/u visit/CPE. Pt agrees with plan.        Follow up plan: Return in about 6 months (around 02/15/2015), or as needed, for annual exam, prior fasting for blood work.

## 2014-09-02 ENCOUNTER — Encounter: Payer: Self-pay | Admitting: Family Medicine

## 2014-09-02 DIAGNOSIS — R740 Nonspecific elevation of levels of transaminase and lactic acid dehydrogenase [LDH]: Secondary | ICD-10-CM

## 2014-09-02 DIAGNOSIS — R7401 Elevation of levels of liver transaminase levels: Secondary | ICD-10-CM | POA: Insufficient documentation

## 2014-09-05 ENCOUNTER — Encounter: Payer: Self-pay | Admitting: *Deleted

## 2014-09-11 ENCOUNTER — Encounter: Payer: Self-pay | Admitting: Family Medicine

## 2014-10-16 ENCOUNTER — Encounter: Payer: Self-pay | Admitting: Family Medicine

## 2014-10-30 ENCOUNTER — Ambulatory Visit (INDEPENDENT_AMBULATORY_CARE_PROVIDER_SITE_OTHER): Payer: 59 | Admitting: Family Medicine

## 2014-10-30 ENCOUNTER — Encounter: Payer: Self-pay | Admitting: Family Medicine

## 2014-10-30 VITALS — BP 110/60 | HR 64 | Temp 98.4°F | Wt 183.0 lb

## 2014-10-30 DIAGNOSIS — L989 Disorder of the skin and subcutaneous tissue, unspecified: Secondary | ICD-10-CM | POA: Insufficient documentation

## 2014-10-30 DIAGNOSIS — N529 Male erectile dysfunction, unspecified: Secondary | ICD-10-CM | POA: Insufficient documentation

## 2014-10-30 LAB — HM DIABETES EYE EXAM

## 2014-10-30 MED ORDER — SILDENAFIL CITRATE 100 MG PO TABS: 50.0000 mg | ORAL_TABLET | Freq: Every day | ORAL | Status: DC | PRN

## 2014-10-30 NOTE — Progress Notes (Addendum)
   BP 110/60 mmHg  Pulse 64  Temp(Src) 98.4 F (36.9 C) (Oral)  Wt 183 lb (83.008 kg)   CC: check nose  Subjective:    Patient ID: Kevin Sheppard., male    DOB: [birth year], 64 y.o.   MRN: [id number]  HPI: Kevin Sheppard. is a 64 y.o. male presenting on 10/30/2014 for Skin check   Blemish on nose present for last 4 months. Seems to be getting darker and possibly larger. Denies inciting trauma/injury, pimple. No pain or skin texture changes.  No h/o skin troubles in the past. No fmhx skin cancer. Has gotten a lot of sun. Doesn't use a lot of sunscreen.   Also endorses worsening ED - trouble obtaining erection. Cialis samples didn't help. No chest pain or tightness or dyspnea.   Lab Results  Component Value Date   HGBA1C 6.6* 08/04/2014     Relevant past medical, surgical, family and social history reviewed and updated as indicated. Interim medical history since our last visit reviewed. Allergies and medications reviewed and updated. Current Outpatient Prescriptions on File Prior to Visit  Medication Sig  . gabapentin (NEURONTIN) 300 MG capsule Take 300 mg by mouth daily as needed.  Marland Kitchen ibuprofen (ADVIL,MOTRIN) 800 MG tablet   . levothyroxine (SYNTHROID, LEVOTHROID) 100 MCG tablet   . Multiple Vitamins-Minerals (MULTIVITAMIN ADULTS 50+ PO) Take 1 tablet by mouth daily.  . Omega-3 Fatty Acids (FISH OIL) 1000 MG CAPS Take 1 capsule by mouth daily.  . sitaGLIPtin (JANUVIA) 100 MG tablet Take 1 tablet (100 mg total) by mouth daily.  Marland Kitchen zolpidem (AMBIEN CR) 12.5 MG CR tablet    No current facility-administered medications on file prior to visit.    Review of Systems Per HPI unless specifically indicated above     Objective:    BP 110/60 mmHg  Pulse 64  Temp(Src) 98.4 F (36.9 C) (Oral)  Wt 183 lb (83.008 kg)  Wt Readings from Last 3 Encounters:  10/30/14 183 lb (83.008 kg)  08/16/14 180 lb 8 oz (81.874 kg)  08/04/14 178 lb (80.74 kg)    Physical Exam  Constitutional: He  appears well-developed and well-nourished. No distress.  Skin: Skin is warm and dry. No rash noted.  Small hyperpigmented macules on bridge of nose, mild surrounding erythema  Nursing note and vitals reviewed.     Assessment & Plan:   Problem List Items Addressed This Visit    Erectile dysfunction    I do not see med related cause for erectile dysfunction - will trial viagra 50-100mg  daily prn. copon provided. cialis ineffective in past. Will address chol levels at next visit.      Benign skin lesion of nose - Primary    Significant sun exposure in past. Anticipate solar lentigo. Not consistent with precancerous lesion. Given mild erythema and darkening/enlarging per patient, will refer to derm for second opinion. Pt agrees with plan.      Relevant Orders   Ambulatory referral to Dermatology       Follow up plan: Return if symptoms worsen or fail to improve.

## 2014-10-30 NOTE — Patient Instructions (Signed)
Spot on nose looks ok - I think it's from sun damage. As enlarging and getting darker, let's see what dermatologist says - pass by [facility]'s office for referral. Good to see you today

## 2014-10-30 NOTE — Progress Notes (Signed)
Pre visit review using our clinic review tool, if applicable. No additional management support is needed unless otherwise documented below in the visit note. 

## 2014-10-30 NOTE — Assessment & Plan Note (Signed)
Significant sun exposure in past. Anticipate solar lentigo. Not consistent with precancerous lesion. Given mild erythema and darkening/enlarging per patient, will refer to derm for second opinion. Pt agrees with plan.

## 2014-10-30 NOTE — Assessment & Plan Note (Addendum)
I do not see med related cause for erectile dysfunction - will trial viagra 50-100mg  daily prn. copon provided. cialis ineffective in past. Will address chol levels at next visit.

## 2014-11-04 ENCOUNTER — Encounter: Payer: Self-pay | Admitting: Family Medicine

## 2014-11-15 ENCOUNTER — Telehealth: Payer: Self-pay | Admitting: Family Medicine

## 2014-11-15 NOTE — Telephone Encounter (Signed)
Noted. Thanks. As pt refused ER ok to offer pt appt in office later this week.

## 2014-11-15 NOTE — Telephone Encounter (Signed)
Late entry- Appt scheduled for 11/17/14. Then see below.

## 2014-11-15 NOTE — Telephone Encounter (Signed)
Pt called back to cancel appointment He stated he didn't need appointment he thinks he was taking his meds wrong Appointment was for ? Altered LOC 2 days ago-refused ER

## 2014-11-15 NOTE — Telephone Encounter (Signed)
Dr Danise Mina advised due to pts earlier altered state pt needs urgent evaluation at ED now. Spoke with Kevin Sheppard and she advised pt refuses to go to ED; pt does not remember episode of slurred speech, confusion or staggering.Kevin Sheppard asked me to speak with pt; Advised pt with earlier episode of slurred speech, confusion and staggering pt needs to be evaluated urgently even though pt is not experiencing symptoms now. Advised pt this could be warning signs of stroke or could have had TIA or stroke earlier. Pt said there is nothing wrong with him except he is tired; Pt refuses to go to ED for evaluation. Pt said he does not need to go anywhere because he did not have problem. Advised pt that his wife and daughter saw pt earlier with slurred speech, confusion and staggering when tried to walk. Pt said it did not happen and he is not going anywhere. Kevin Sheppard would send note to Dr Danise Mina advising pt refused advice and Kevin Sheppard who is a nurse said if pt condition changes or worsens will call 911. Call Kevin Sheppard back with further advise.

## 2014-11-15 NOTE — Telephone Encounter (Signed)
Nurse Assessment Nurse: Kevin Girt, RN, Kevin Sheppard Date/Time Kevin Sheppard Time): 11/15/2014 10:20:57 AM Confirm and document reason for call. If symptomatic, describe symptoms. ---Caller states her husband all of sudden started slurring his words, displayed "strange behavior" and started staggering. She helped him to bed, he has laid down and is feeling better now. Caller does not know if he was tired or maybe had a stroke. BP is 97/53, which is normal for him. He had worked all night, but is not sure if he took his [place] or not. BS was 126. O2 sat was 93% and he was in a "confused state". Here dtr came over, they woke him up and is acting "normal" Has the patient traveled out of the country within the last 30 days? ---Not Applicable Does the patient require triage? ---Yes Related visit to physician within the last 2 weeks? ---No Does the PT have any chronic conditions? (i.e. diabetes, asthma, etc.) ---Yes List chronic conditions. ---Type 2 diabetic, runner/hypotensive, hypothyroid Guidelines Guideline Title Affirmed Question Affirmed Notes Neurologic Deficit [1] Loss of speech or garbled speech AND [2] sudden onset AND [3] transient (i.e., completely resolved) Final Disposition User Go to ED Now (or PCP triage) Kevin Girt, RN, Kevin Sheppard refuses to let his family take him to the ED, and wife is requesting an appt. Thursday or Friday. Advised that I would notify the office of his sx and outcome and they will call her back. Verbalized understanding

## 2014-11-15 NOTE — Telephone Encounter (Signed)
Kevin Sheppard requests callback from [name].  She would not divulge reason for call.

## 2014-11-16 NOTE — Telephone Encounter (Signed)
Spoke with patient's wife. She said that patient was taking all of his meds at 7:30 AM including sleeping pill and sugar pill. She believes that his sugar dropped since he was taking his sugar med and not eating-just going to bed. He is now going to start taking it when he wakes up in the afternoon since that is technically his morning.

## 2014-11-16 NOTE — Telephone Encounter (Signed)
Sounds good thanks

## 2014-11-17 ENCOUNTER — Ambulatory Visit: Payer: 59 | Admitting: Family Medicine

## 2014-12-07 ENCOUNTER — Other Ambulatory Visit: Payer: Self-pay | Admitting: Family Medicine

## 2015-02-05 ENCOUNTER — Other Ambulatory Visit: Payer: Self-pay | Admitting: Family Medicine

## 2015-02-05 MED ORDER — ZOLPIDEM TARTRATE ER 12.5 MG PO TBCR: 12.5000 mg | EXTENDED_RELEASE_TABLET | Freq: Every evening | ORAL | Status: DC | PRN

## 2015-02-05 NOTE — Telephone Encounter (Signed)
plz phone in and notify pt. 

## 2015-02-05 NOTE — Telephone Encounter (Signed)
Prescription called to [facility] [address] in [place].  Patient notified via notified identifiable voice mail.

## 2015-02-05 NOTE — Telephone Encounter (Signed)
Pt called and is out of his previously prescribed medication zolpidem (AMBIEN CR) 12.5 MG CR tablet [[id number]] from his previous PCP.  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  Best number to call pt is [phone].

## 2015-03-05 ENCOUNTER — Other Ambulatory Visit: Payer: Self-pay | Admitting: Family Medicine

## 2015-03-05 ENCOUNTER — Other Ambulatory Visit (INDEPENDENT_AMBULATORY_CARE_PROVIDER_SITE_OTHER): Payer: Managed Care, Other (non HMO)

## 2015-03-05 DIAGNOSIS — Z125 Encounter for screening for malignant neoplasm of prostate: Secondary | ICD-10-CM | POA: Diagnosis not present

## 2015-03-05 DIAGNOSIS — E039 Hypothyroidism, unspecified: Secondary | ICD-10-CM | POA: Diagnosis not present

## 2015-03-05 DIAGNOSIS — E119 Type 2 diabetes mellitus without complications: Secondary | ICD-10-CM

## 2015-03-05 DIAGNOSIS — R740 Nonspecific elevation of levels of transaminase and lactic acid dehydrogenase [LDH]: Secondary | ICD-10-CM | POA: Diagnosis not present

## 2015-03-05 DIAGNOSIS — R7401 Elevation of levels of liver transaminase levels: Secondary | ICD-10-CM

## 2015-03-05 DIAGNOSIS — E785 Hyperlipidemia, unspecified: Secondary | ICD-10-CM

## 2015-03-05 LAB — BASIC METABOLIC PANEL
BUN: 14 mg/dL (ref 6–23)
CO2: 26 meq/L (ref 19–32)
Calcium: 9.6 mg/dL (ref 8.4–10.5)
Chloride: 105 mEq/L (ref 96–112)
Creatinine, Ser: 0.71 mg/dL (ref 0.40–1.50)
GFR: 119.18 mL/min (ref >60.00)
GLUCOSE: 137 mg/dL — AB (ref 70–99)
Potassium: 3.9 mEq/L (ref 3.5–5.1)
SODIUM: 138 meq/L (ref 135–145)

## 2015-03-05 LAB — HEPATIC FUNCTION PANEL
ALT: 22 U/L (ref 0–53)
AST: 24 U/L (ref 0–37)
Albumin: 4.4 g/dL (ref 3.5–5.2)
Alkaline Phosphatase: 89 U/L (ref 39–117)
BILIRUBIN TOTAL: 0.8 mg/dL (ref 0.2–1.2)
Bilirubin, Direct: 0.1 mg/dL (ref 0.0–0.3)
Total Protein: 6.6 g/dL (ref 6.0–8.3)

## 2015-03-05 LAB — HEMOGLOBIN A1C: Hgb A1c MFr Bld: 6.5 % (ref 4.6–6.5)

## 2015-03-05 LAB — LIPID PANEL
Cholesterol: 195 mg/dL (ref 0–200)
HDL: 50.2 mg/dL (ref >39.00)
LDL CALC: 125 mg/dL — AB (ref 0–99)
NonHDL: 144.8
TRIGLYCERIDES: 99 mg/dL (ref 0.0–149.0)
Total CHOL/HDL Ratio: 4
VLDL: 19.8 mg/dL (ref 0.0–40.0)

## 2015-03-05 LAB — MICROALBUMIN / CREATININE URINE RATIO
Creatinine,U: 137.8 mg/dL
MICROALB/CREAT RATIO: 0.6 mg/g (ref 0.0–30.0)
Microalb, Ur: 0.8 mg/dL (ref 0.0–1.9)

## 2015-03-05 LAB — PSA: PSA: 0.4 ng/mL (ref 0.10–4.00)

## 2015-03-05 LAB — TSH: TSH: 1.74 u[IU]/mL (ref 0.35–4.50)

## 2015-03-07 ENCOUNTER — Encounter: Payer: 59 | Admitting: Family Medicine

## 2015-03-08 ENCOUNTER — Encounter: Payer: 59 | Admitting: Family Medicine

## 2015-03-13 ENCOUNTER — Ambulatory Visit (INDEPENDENT_AMBULATORY_CARE_PROVIDER_SITE_OTHER): Payer: Managed Care, Other (non HMO) | Admitting: Family Medicine

## 2015-03-13 ENCOUNTER — Encounter: Payer: Self-pay | Admitting: Family Medicine

## 2015-03-13 VITALS — BP 108/60 | HR 60 | Temp 98.1°F | Ht 64.5 in | Wt 179.2 lb

## 2015-03-13 DIAGNOSIS — R740 Nonspecific elevation of levels of transaminase and lactic acid dehydrogenase [LDH]: Secondary | ICD-10-CM

## 2015-03-13 DIAGNOSIS — Z0000 Encounter for general adult medical examination without abnormal findings: Secondary | ICD-10-CM | POA: Insufficient documentation

## 2015-03-13 DIAGNOSIS — G629 Polyneuropathy, unspecified: Secondary | ICD-10-CM

## 2015-03-13 DIAGNOSIS — N529 Male erectile dysfunction, unspecified: Secondary | ICD-10-CM

## 2015-03-13 DIAGNOSIS — Z86010 Personal history of colonic polyps: Secondary | ICD-10-CM

## 2015-03-13 DIAGNOSIS — R7401 Elevation of levels of liver transaminase levels: Secondary | ICD-10-CM

## 2015-03-13 DIAGNOSIS — Z1211 Encounter for screening for malignant neoplasm of colon: Secondary | ICD-10-CM

## 2015-03-13 DIAGNOSIS — E785 Hyperlipidemia, unspecified: Secondary | ICD-10-CM

## 2015-03-13 DIAGNOSIS — Q5310 Unspecified undescended testicle, unilateral: Secondary | ICD-10-CM | POA: Insufficient documentation

## 2015-03-13 DIAGNOSIS — E039 Hypothyroidism, unspecified: Secondary | ICD-10-CM

## 2015-03-13 DIAGNOSIS — E119 Type 2 diabetes mellitus without complications: Secondary | ICD-10-CM

## 2015-03-13 MED ORDER — SITAGLIPTIN PHOSPHATE 100 MG PO TABS: 100.0000 mg | ORAL_TABLET | Freq: Every day | ORAL | Status: DC

## 2015-03-13 MED ORDER — SYNTHROID 100 MCG PO TABS: 100.0000 ug | ORAL_TABLET | Freq: Every day | ORAL | Status: DC

## 2015-03-13 NOTE — Assessment & Plan Note (Addendum)
Discussed with patient. No evident trouble in last 54 years. Will continue to monitor.

## 2015-03-13 NOTE — Assessment & Plan Note (Signed)
Preventative protocols reviewed and updated unless pt declined. Discussed healthy diet and lifestyle.  

## 2015-03-13 NOTE — Assessment & Plan Note (Signed)
Resolved

## 2015-03-13 NOTE — Assessment & Plan Note (Addendum)
Discussed rec colonoscopy, pt feels comfortable waiting, but requests ifob today.

## 2015-03-13 NOTE — Patient Instructions (Addendum)
Try brand Synthroid 176mcg daily sent to pharmacy. Call your insurance about the shingles shot to see if it is covered or how much it would cost and where is cheaper (here or pharmacy).  If you want to receive here, call for nurse visit.  Return in 2 months for follow up.  Good to see you today, call us with questions.

## 2015-03-13 NOTE — Assessment & Plan Note (Addendum)
Pt attributes to levothyroxine - will trial brand synthroid. See below. viagra not very effective and too expensive. viagra coupon provided today.

## 2015-03-13 NOTE — Assessment & Plan Note (Signed)
Chronic, stable. Continue current regimen. 

## 2015-03-13 NOTE — Progress Notes (Signed)
BP 108/60 mmHg  Pulse 60  Temp(Src) 98.1 F (36.7 C) (Oral)  Ht 5' 4.5" (1.638 m)  Wt 179 lb 4 oz (81.307 kg)  BMI 30.30 kg/m2   CC: CPE  Subjective:    Patient ID: Kevin Thielke., male    DOB: 23-Dec-1950, 64 y.o.   MRN: [id number]  HPI: Kevin Kunz. is a 64 y.o. male presenting on 03/13/2015 for Annual Exam   Compliant with levothyroxine 126mg daily - feels affecting ED. viagra was not too effective. Has tried natural remedies which aren't too effective. Interested in brand synthroid.  Preventative: Last CPE 05/2014 - ok for one per calendar year. Colonoscopy - benign polyps 2004, no polyps 2009 [place]rs [place]. Requests stool kit today. Prostate cancer screening - discussed. Would like to continue. Flu shot receives yearly Tetanus pt thinks recently done prevnar 05/2014 zostavax - discussed. Will check with insurance.  Seat belt use discussed No changing moles.   He lives with wife. 2 dogs. They have grown 4 children and 3 grandchildren  He works on cigarette machines, 3rd shift  Activity: goes to gym regularly  Diet: good water, fruits/vegetables daily   Relevant past medical, surgical, family and social history reviewed and updated as indicated. Interim medical history since our last visit reviewed. Allergies and medications reviewed and updated. Current Outpatient Prescriptions on File Prior to Visit  Medication Sig  . gabapentin (NEURONTIN) 300 MG capsule Take 300 mg by mouth daily as needed.  .Marland Kitchenibuprofen (ADVIL,MOTRIN) 800 MG tablet   . Multiple Vitamins-Minerals (MULTIVITAMIN ADULTS 50+ PO) Take 1 tablet by mouth daily.  . Omega-3 Fatty Acids (FISH OIL) 1000 MG CAPS Take 1 capsule by mouth daily.  .Marland KitchenVIAGRA 100 MG tablet TAKE 1/2 TO 1 TABLET BY MOUT DAILY AS NEEDED FOR ERECTILE DYSFUNCTION  . zolpidem (AMBIEN CR) 12.5 MG CR tablet Take 1 tablet (12.5 mg total) by mouth at bedtime as needed for sleep.   No current facility-administered  medications on file prior to visit.    Review of Systems  Constitutional: Negative for fever, chills, activity change, appetite change, fatigue and unexpected weight change.  HENT: Negative for hearing loss.   Eyes: Negative for visual disturbance.  Respiratory: Negative for cough, chest tightness, shortness of breath and wheezing.   Cardiovascular: Negative for chest pain, palpitations and leg swelling.  Gastrointestinal: Positive for diarrhea. Negative for nausea, vomiting, abdominal pain, constipation, blood in stool and abdominal distention.  Genitourinary: Negative for hematuria and difficulty urinating.  Musculoskeletal: Negative for myalgias, arthralgias and neck pain.  Skin: Negative for rash.  Neurological: Negative for dizziness, seizures, syncope and headaches.  Hematological: Negative for adenopathy. Does not bruise/bleed easily.  Psychiatric/Behavioral: Negative for dysphoric mood. The patient is not nervous/anxious.    Per HPI unless specifically indicated above     Objective:    BP 108/60 mmHg  Pulse 60  Temp(Src) 98.1 F (36.7 C) (Oral)  Ht 5' 4.5" (1.638 m)  Wt 179 lb 4 oz (81.307 kg)  BMI 30.30 kg/m2  Wt Readings from Last 3 Encounters:  03/13/15 179 lb 4 oz (81.307 kg)  10/30/14 183 lb (83.008 kg)  08/16/14 180 lb 8 oz (81.874 kg)    Physical Exam  Constitutional: He is oriented to person, place, and time. He appears well-developed and well-nourished. No distress.  HENT:  Head: Normocephalic and atraumatic.  Right Ear: Hearing, tympanic membrane, external ear and ear canal normal.  Left Ear: Hearing, tympanic  membrane, external ear and ear canal normal.  Nose: Nose normal.  Mouth/Throat: Uvula is midline, oropharynx is clear and moist and mucous membranes are normal. No oropharyngeal exudate, posterior oropharyngeal edema or posterior oropharyngeal erythema.  Eyes: Conjunctivae and EOM are normal. Pupils are equal, round, and reactive to light. No scleral  icterus.  Neck: Normal range of motion. Neck supple. No thyromegaly present.  Cardiovascular: Normal rate, regular rhythm, normal heart sounds and intact distal pulses.   No murmur heard. Pulses:      Radial pulses are 2+ on the right side, and 2+ on the left side.  Pulmonary/Chest: Effort normal and breath sounds normal. No respiratory distress. He has no wheezes. He has no rales.  Abdominal: Soft. Bowel sounds are normal. He exhibits no distension and no mass. There is no tenderness. There is no rebound and no guarding. Hernia confirmed negative in the right inguinal area and confirmed negative in the left inguinal area.  Genitourinary: Rectum normal, prostate normal and penis normal. Rectal exam shows no external hemorrhoid, no fissure, no mass, no tenderness and anal tone normal. Prostate is not enlarged and not tender. Right testis shows no mass, no swelling and no tenderness. Right testis is descended. Uncircumcised.  L testis absent  Musculoskeletal: Normal range of motion. He exhibits no edema.  Lymphadenopathy:    He has no cervical adenopathy.  Neurological: He is alert and oriented to person, place, and time.  CN grossly intact, station and gait intact  Skin: Skin is warm and dry. No rash noted.  Psychiatric: He has a normal mood and affect. His behavior is normal. Judgment and thought content normal.  Nursing note and vitals reviewed.  Results for orders placed or performed in visit on 03/05/15  Lipid panel  Result Value Ref Range   Cholesterol 195 0 - 200 mg/dL   Triglycerides 99.0 0.0 - 149.0 mg/dL   HDL 50.20 >39.00 mg/dL   VLDL 19.8 0.0 - 40.0 mg/dL   LDL Cholesterol 125 (H) 0 - 99 mg/dL   Total CHOL/HDL Ratio 4    NonHDL 765.46   Basic metabolic panel  Result Value Ref Range   Sodium 138 135 - 145 mEq/L   Potassium 3.9 3.5 - 5.1 mEq/L   Chloride 105 96 - 112 mEq/L   CO2 26 19 - 32 mEq/L   Glucose, Bld 137 (H) 70 - 99 mg/dL   BUN 14 6 - 23 mg/dL   Creatinine, Ser  0.71 0.40 - 1.50 mg/dL   Calcium 9.6 8.4 - 10.5 mg/dL   GFR 119.18 >60.00 mL/min  Hemoglobin A1c  Result Value Ref Range   Hgb A1c MFr Bld 6.5 4.6 - 6.5 %  TSH  Result Value Ref Range   TSH 1.74 0.35 - 4.50 uIU/mL  PSA  Result Value Ref Range   PSA 0.40 0.10 - 4.00 ng/mL  Microalbumin / creatinine urine ratio  Result Value Ref Range   Microalb, Ur 0.8 0.0 - 1.9 mg/dL   Creatinine,U 137.8 mg/dL   Microalb Creat Ratio 0.6 0.0 - 30.0 mg/g  Hepatic Function Panel  Result Value Ref Range   Total Bilirubin 0.8 0.2 - 1.2 mg/dL   Bilirubin, Direct 0.1 0.0 - 0.3 mg/dL   Alkaline Phosphatase 89 39 - 117 U/L   AST 24 0 - 37 U/L   ALT 22 0 - 53 U/L   Total Protein 6.6 6.0 - 8.3 g/dL   Albumin 4.4 3.5 - 5.2 g/dL  Assessment & Plan:   Problem List Items Addressed This Visit    Diabetes type 2, controlled    Chronic stable. Continue januvia      Relevant Medications   sitaGLIPtin (JANUVIA) 100 MG tablet   Erectile dysfunction    Pt attributes to levothyroxine - will trial brand synthroid. See below. viagra not very effective and too expensive. viagra coupon provided today.      Health maintenance examination - Primary    Preventative protocols reviewed and updated unless pt declined. Discussed healthy diet and lifestyle.       History of colon polyps    Discussed rec colonoscopy, pt feels comfortable waiting, but requests ifob today.       HLD (hyperlipidemia)    Chronic, stable. Continue current regimen.      Hypothyroidism    Remains concerned levothyroxine is causing erectile dysfunction - will trial brand synthroid. Pt aware this will be higher cost.      Relevant Medications   SYNTHROID 100 MCG tablet   Peripheral neuropathy    Resolved after starting levothyroxine. Prn gabapentin use.      RESOLVED: Transaminitis    Resolved.      Undescended left testicle    Discussed with patient. No evident trouble in last 54 years. Will continue to monitor.         Other Visit Diagnoses    Special screening for malignant neoplasms, colon        Relevant Orders    Fecal occult blood, imunochemical        Follow up plan: Return in about 2 months (around 05/13/2015), or as needed, for follow up visit.

## 2015-03-13 NOTE — Assessment & Plan Note (Signed)
Remains concerned levothyroxine is causing erectile dysfunction - will trial brand synthroid. Pt aware this will be higher cost.

## 2015-03-13 NOTE — Assessment & Plan Note (Signed)
Resolved after starting levothyroxine. Prn gabapentin use.

## 2015-03-13 NOTE — Progress Notes (Signed)
Pre visit review using our clinic review tool, if applicable. No additional management support is needed unless otherwise documented below in the visit note. 

## 2015-03-13 NOTE — Assessment & Plan Note (Signed)
Chronic stable. Continue [place]

## 2015-04-03 ENCOUNTER — Other Ambulatory Visit: Payer: Self-pay | Admitting: Family Medicine

## 2015-04-04 NOTE — Telephone Encounter (Signed)
Rx called in to pharmacy. 

## 2015-04-04 NOTE — Telephone Encounter (Signed)
Dr Darnell Level pt--last filled 02/05/15--please advise

## 2015-04-09 ENCOUNTER — Other Ambulatory Visit: Payer: Self-pay | Admitting: Endocrinology

## 2015-04-12 ENCOUNTER — Telehealth: Payer: Self-pay | Admitting: Family Medicine

## 2015-04-12 NOTE — Telephone Encounter (Signed)
Pt called wanting to know why his rx for januvia 100mg  was denied Dr Langston Masker prescribed this for him.   He need refill on either januvia or metform  Please advise

## 2015-04-12 NOTE — Telephone Encounter (Signed)
Spoke with patient. Advised Januvia wasn't denied and in fact was sent in 03/13/15 for one year. He said he would check with the pharmacy.

## 2015-05-06 ENCOUNTER — Other Ambulatory Visit: Payer: Self-pay | Admitting: Family Medicine

## 2015-05-06 DIAGNOSIS — E039 Hypothyroidism, unspecified: Secondary | ICD-10-CM

## 2015-05-07 NOTE — Telephone Encounter (Signed)
Rx called to pharmacy

## 2015-05-07 NOTE — Telephone Encounter (Signed)
plz phone in. 

## 2015-05-08 ENCOUNTER — Other Ambulatory Visit (INDEPENDENT_AMBULATORY_CARE_PROVIDER_SITE_OTHER): Payer: Managed Care, Other (non HMO)

## 2015-05-08 DIAGNOSIS — E039 Hypothyroidism, unspecified: Secondary | ICD-10-CM | POA: Diagnosis not present

## 2015-05-08 LAB — TSH: TSH: 1.35 u[IU]/mL (ref 0.35–4.50)

## 2015-05-14 ENCOUNTER — Other Ambulatory Visit: Payer: Managed Care, Other (non HMO)

## 2015-05-14 DIAGNOSIS — Z1211 Encounter for screening for malignant neoplasm of colon: Secondary | ICD-10-CM

## 2015-05-14 LAB — FECAL OCCULT BLOOD, GUAIAC: Fecal Occult Blood: NEGATIVE

## 2015-05-14 LAB — FECAL OCCULT BLOOD, IMMUNOCHEMICAL: FECAL OCCULT BLD: NEGATIVE

## 2015-05-15 ENCOUNTER — Ambulatory Visit (INDEPENDENT_AMBULATORY_CARE_PROVIDER_SITE_OTHER): Payer: Managed Care, Other (non HMO) | Admitting: Family Medicine

## 2015-05-15 ENCOUNTER — Encounter: Payer: Self-pay | Admitting: *Deleted

## 2015-05-15 ENCOUNTER — Encounter: Payer: Self-pay | Admitting: Family Medicine

## 2015-05-15 ENCOUNTER — Encounter: Payer: Self-pay | Admitting: Radiology

## 2015-05-15 VITALS — BP 100/60 | HR 52 | Temp 98.0°F | Wt 184.8 lb

## 2015-05-15 DIAGNOSIS — E039 Hypothyroidism, unspecified: Secondary | ICD-10-CM | POA: Diagnosis not present

## 2015-05-15 DIAGNOSIS — G4726 Circadian rhythm sleep disorder, shift work type: Secondary | ICD-10-CM | POA: Diagnosis not present

## 2015-05-15 MED ORDER — SITAGLIPTIN PHOSPHATE 100 MG PO TABS: 100.0000 mg | ORAL_TABLET | Freq: Every day | ORAL | Status: DC

## 2015-05-15 MED ORDER — SYNTHROID 100 MCG PO TABS: 100.0000 ug | ORAL_TABLET | Freq: Every day | ORAL | Status: DC

## 2015-05-15 NOTE — Patient Instructions (Addendum)
You are on correct dose of thyroid medicine.  We will continue brand synthroid. Glucose meter provided today - check with insurance to see which brand they prefer. Controlled substance agreement today. Remind [place] to fill 3 month supply of ambien at next refill. Good to see you today, return in January for diabetes check.

## 2015-05-15 NOTE — Assessment & Plan Note (Signed)
Improved sxs (less ED) on brand synthroid - continue.  Pt agrees with plan.

## 2015-05-15 NOTE — Assessment & Plan Note (Signed)
Longterm on ambien CR 12.5mg  daily. Continue. Pt requests 90d supply. Discussed controlled substance use, including addiction/abuse potential and dependence/tolerance. Discussed pros/cons of ambien use. Discussed expectations for our office to fill med. Pt will fill out controlled substance agreement today.  Will do 90d supply next refill.

## 2015-05-15 NOTE — Progress Notes (Signed)
Pre visit review using our clinic review tool, if applicable. No additional management support is needed unless otherwise documented below in the visit note. 

## 2015-05-15 NOTE — Progress Notes (Signed)
BP 100/60 mmHg  Pulse 52  Temp(Src) 98 F (36.7 C) (Oral)  Wt 184 lb 12 oz (83.802 kg)   CC: 2 month f/u visit  Subjective:    Patient ID: Kevin Lizotte., male    DOB: 09-11-1951, 64 y.o.   MRN: [id number]  HPI: Kevin Sheppard. is a 64 y.o. male presenting on 05/15/2015 for Follow-up   Last visit we transitioned from generic synthroid to brand 2/2 pt concerns over erectile dysfunction caused by generic. He feels that ED issues have improved.   Lab Results  Component Value Date   TSH 1.35 05/08/2015    Lab Results  Component Value Date   HGBA1C 6.5 03/05/2015  Glucose meter broke - requests new one today. Has been using wife's.   Requests 90 d ambien prescription. Takes ambien CR 12.5mg  daily. Has been on this for about 8 years. Works third shift.   Will fill 90 d supply.   Relevant past medical, surgical, family and social history reviewed and updated as indicated. Interim medical history since our last visit reviewed. Allergies and medications reviewed and updated. Current Outpatient Prescriptions on File Prior to Visit  Medication Sig  . ibuprofen (ADVIL,MOTRIN) 800 MG tablet   . Multiple Vitamins-Minerals (MULTIVITAMIN ADULTS 50+ PO) Take 1 tablet by mouth daily.  . Omega-3 Fatty Acids (FISH OIL) 1000 MG CAPS Take 1 capsule by mouth daily.  Marland Kitchen zolpidem (AMBIEN CR) 12.5 MG CR tablet TAKE 1 TABLET BY MOUTH AT BEDTIME AS NEEDED  . gabapentin (NEURONTIN) 300 MG capsule Take 300 mg by mouth daily as needed.  Marland Kitchen VIAGRA 100 MG tablet TAKE 1/2 TO 1 TABLET BY MOUT DAILY AS NEEDED FOR ERECTILE DYSFUNCTION (Patient not taking: Reported on 05/15/2015)   No current facility-administered medications on file prior to visit.    Review of Systems Per HPI unless specifically indicated above     Objective:    BP 100/60 mmHg  Pulse 52  Temp(Src) 98 F (36.7 C) (Oral)  Wt 184 lb 12 oz (83.802 kg)  Wt Readings from Last 3 Encounters:  05/15/15 184 lb 12 oz (83.802 kg)  03/13/15  179 lb 4 oz (81.307 kg)  10/30/14 183 lb (83.008 kg)    Physical Exam  Constitutional: He appears well-developed and well-nourished. No distress.  Neck: No thyromegaly present.  Cardiovascular: Normal rate, regular rhythm, normal heart sounds and intact distal pulses.   No murmur heard. Pulmonary/Chest: Effort normal and breath sounds normal. No respiratory distress. He has no wheezes. He has no rales.  Musculoskeletal: He exhibits no edema.  Nursing note and vitals reviewed.  Results for orders placed or performed in visit on 05/14/15  Fecal occult blood, imunochemical  Result Value Ref Range   Fecal Occult Bld Negative Negative      Assessment & Plan:   Problem List Items Addressed This Visit    Hypothyroidism    Improved sxs (less ED) on brand synthroid - continue.  Pt agrees with plan.      Relevant Medications   SYNTHROID 100 MCG tablet   Shift work sleep disorder - Primary    Longterm on ambien CR 12.5mg  daily. Continue. Pt requests 90d supply. Discussed controlled substance use, including addiction/abuse potential and dependence/tolerance. Discussed pros/cons of ambien use. Discussed expectations for our office to fill med. Pt will fill out controlled substance agreement today.  Will do 90d supply next refill.          Follow up plan: Return in about  4 months (around 09/14/2015), or as needed, for follow up visit.

## 2015-06-07 ENCOUNTER — Telehealth: Payer: Self-pay

## 2015-06-07 MED ORDER — ZOLPIDEM TARTRATE ER 12.5 MG PO TBCR: 12.5000 mg | EXTENDED_RELEASE_TABLET | Freq: Every evening | ORAL | Status: DC | PRN

## 2015-06-07 NOTE — Telephone Encounter (Signed)
Phoned in. Spoke with patient about recent + UDS. Pt states one time thing on camping trip, does not recurrently use. Will recheck in 3 mo.  Also thinks will want to change from [place] to metformin - checking on januvia cost for the upcoming year and will let us know.

## 2015-06-07 NOTE — Telephone Encounter (Signed)
Pt request 90 day refill amb[facility] cr to [facility] [address] Pt was seen 05/15/15 and discussed with Dr Darnell Level about refilling 90 day rx. ambien cr last refilled # 30 on 05/07/15. Last seen 05/15/2015.

## 2015-06-08 MED ORDER — METFORMIN HCL 500 MG PO TABS: 500.0000 mg | ORAL_TABLET | Freq: Two times a day (BID) | ORAL | Status: DC

## 2015-06-08 NOTE — Telephone Encounter (Signed)
Sent new med to pharmacy.

## 2015-06-08 NOTE — Telephone Encounter (Signed)
Pt left v/m; pt cannot get another [place] coupon. Pt request metformin sent to [facility]. Pt has one week of Januvia left.

## 2015-06-08 NOTE — Addendum Note (Signed)
Addended by: Ria Bush on: 06/08/2015 01:53 PM   Modules accepted: Orders, Medications

## 2015-09-12 ENCOUNTER — Other Ambulatory Visit: Payer: Self-pay | Admitting: Family Medicine

## 2015-09-13 NOTE — Telephone Encounter (Signed)
plz phone in. 

## 2015-09-13 NOTE — Telephone Encounter (Signed)
Ok to refill 

## 2015-09-13 NOTE — Telephone Encounter (Signed)
Rx called in as directed.   

## 2015-10-22 ENCOUNTER — Telehealth: Payer: Self-pay

## 2015-10-22 ENCOUNTER — Ambulatory Visit: Payer: Managed Care, Other (non HMO) | Admitting: Internal Medicine

## 2015-10-22 ENCOUNTER — Other Ambulatory Visit: Payer: Self-pay | Admitting: Family Medicine

## 2015-10-22 NOTE — Telephone Encounter (Signed)
pts wife (DPR signed) walked in; wants pt to be tapered off zolpidem but wants refill of zolpidem because pt is out of med; for 6 - 8 months pt has confusion which has recently worsened; pt left stove on and smoked up house, pt went to H/T at 3 AM on Sat after taking zolpidem at hs, repeated at 1 AM and after returned from H/T took another zolpidem. Pt did not remember going to H/T.  Pt works 3rd shift. Dr said OK to refill the # 30 that is already available for refill and schedule 1st available with Dr Darnell Level. Mrs Kearney request 10/23/15 at 3:45 and Dr Oswaldo Conroy. Mrs Pierpont will keep med and give to pt and Mrs Weinstein will come to 10/23/15 appt. Mrs Somero said she would monitor pt until seen.

## 2015-10-23 ENCOUNTER — Ambulatory Visit: Payer: Managed Care, Other (non HMO) | Admitting: Family Medicine

## 2015-10-23 ENCOUNTER — Encounter: Payer: Self-pay | Admitting: Family Medicine

## 2015-10-23 ENCOUNTER — Ambulatory Visit (INDEPENDENT_AMBULATORY_CARE_PROVIDER_SITE_OTHER): Payer: Managed Care, Other (non HMO) | Admitting: Family Medicine

## 2015-10-23 VITALS — BP 124/76 | HR 60 | Temp 97.8°F | Wt 184.0 lb

## 2015-10-23 DIAGNOSIS — N529 Male erectile dysfunction, unspecified: Secondary | ICD-10-CM

## 2015-10-23 DIAGNOSIS — E039 Hypothyroidism, unspecified: Secondary | ICD-10-CM

## 2015-10-23 DIAGNOSIS — G4726 Circadian rhythm sleep disorder, shift work type: Secondary | ICD-10-CM

## 2015-10-23 DIAGNOSIS — E785 Hyperlipidemia, unspecified: Secondary | ICD-10-CM

## 2015-10-23 DIAGNOSIS — E119 Type 2 diabetes mellitus without complications: Secondary | ICD-10-CM | POA: Diagnosis not present

## 2015-10-23 MED ORDER — MELATONIN 3 MG PO CAPS: 1.0000 | ORAL_CAPSULE | Freq: Every evening | ORAL | Status: DC | PRN

## 2015-10-23 MED ORDER — ZOLPIDEM TARTRATE 5 MG PO TABS: 5.0000 mg | ORAL_TABLET | Freq: Every evening | ORAL | Status: DC | PRN

## 2015-10-23 MED ORDER — DIPHENHYDRAMINE HCL 25 MG PO CAPS: 25.0000 mg | ORAL_CAPSULE | Freq: Every evening | ORAL | Status: DC | PRN

## 2015-10-23 NOTE — Assessment & Plan Note (Signed)
Change in schedule/shifts unacceptable. 8+ yrs on ambien CR 12.5mg  daily. Reviewed concerns wife brings up. Anticipate pt has developed tolerance and dependence of ambien CR. Will slowly taper off ambien - I have asked patient to bring me pill bottle of ambien CR and will provide #30 ambien IR 5mg  to use over next month - hopeful will be able to slowly taper off med. Reviewed sleep hygiene Reviewed other sleep assistance medications - will try benadryl 25mg  and melatonin 3mg  daily

## 2015-10-23 NOTE — Assessment & Plan Note (Addendum)
Reviewed LDL goal <100 given diabetes. Check FLP today

## 2015-10-23 NOTE — Progress Notes (Signed)
Pre visit review using our clinic review tool, if applicable. No additional management support is needed unless otherwise documented below in the visit note. 

## 2015-10-23 NOTE — Progress Notes (Signed)
BP 124/76 mmHg  Pulse 60  Temp(Src) 97.8 F (36.6 C) (Oral)  Wt 184 lb (83.462 kg)   CC: discuss ambien concerns  Subjective:    Patient ID: Kevin Sheppard., male    DOB: 07/09/1951, 65 y.o.   MRN: [id number]  HPI: Kevin Sheppard. is a 65 y.o. male presenting on 10/23/2015 for Medication Refill and Altered Mental Status   On ambien CR 12.5mg  daily for last 8+ yrs for shift work sleep disorder. See prior note for details. Last visit 04/2015 we had filled 90d supply but pharmacy did not fill this.   Presents with wife today who was concerned about misuse of medication. Wife worried medication too strong. He has left stove on several times. Progressively worsening over last 6 months. Memory worsening when on medication. Over 24 hr period took 3 pills because he didn't remember taking them. Concern for sleep walking.   3rd shift - 11pm-7am.  Wife endorses he has ADHD. Pt states he was diagnosed in his early 62s, saw pyschologist.   Benadryl caused paradoxical reaction of hyperactivity.   Relevant past medical, surgical, family and social history reviewed and updated as indicated. Interim medical history since our last visit reviewed. Allergies and medications reviewed and updated. Current Outpatient Prescriptions on File Prior to Visit  Medication Sig  . Multiple Vitamins-Minerals (MULTIVITAMIN ADULTS 50+ PO) Take 1 tablet by mouth daily.  . Omega-3 Fatty Acids (FISH OIL) 1000 MG CAPS Take 1 capsule by mouth daily.  Marland Kitchen SYNTHROID 100 MCG tablet Take 1 tablet (100 mcg total) by mouth daily before breakfast.  . ibuprofen (ADVIL,MOTRIN) 800 MG tablet Reported on 10/23/2015   No current facility-administered medications on file prior to visit.    Review of Systems Per HPI unless specifically indicated in ROS section     Objective:    BP 124/76 mmHg  Pulse 60  Temp(Src) 97.8 F (36.6 C) (Oral)  Wt 184 lb (83.462 kg)  Wt Readings from Last 3 Encounters:  10/23/15 184 lb (83.462 kg)    05/15/15 184 lb 12 oz (83.802 kg)  03/13/15 179 lb 4 oz (81.307 kg)    Physical Exam  Constitutional: He appears well-developed and well-nourished. No distress.  HENT:  Head: Normocephalic and atraumatic.  Mouth/Throat: Oropharynx is clear and moist. No oropharyngeal exudate.  Cardiovascular: Normal rate, regular rhythm, normal heart sounds and intact distal pulses.   No murmur heard. Pulmonary/Chest: Effort normal and breath sounds normal. No respiratory distress. He has no wheezes. He has no rales.  Musculoskeletal: He exhibits no edema.  Skin: Skin is warm and dry. No rash noted.  Psychiatric: He has a normal mood and affect.  Nursing note and vitals reviewed.     Assessment & Plan:   Problem List Items Addressed This Visit    Shift work sleep disorder - Primary    Change in schedule/shifts unacceptable. 8+ yrs on ambien CR 12.5mg  daily. Reviewed concerns wife brings up. Anticipate pt has developed tolerance and dependence of ambien CR. Will slowly taper off ambien - I have asked patient to bring me pill bottle of ambien CR and will provide #30 ambien IR 5mg  to use over next month - hopeful will be able to slowly taper off med. Reviewed sleep hygiene Reviewed other sleep assistance medications - will try benadryl 25mg  and melatonin 3mg  daily      Hypothyroidism    Check TSH today      Relevant Orders   TSH   HLD (  hyperlipidemia)    Reviewed LDL goal <100 given diabetes. Check FLP today       Erectile dysfunction    viagra ineffective. Discussed other treatment options with patient. Monitor for now.      Diabetes type 2, controlled ([place])    Overdue for DM f/u. On januvia 100mg  not metformin. Check A1c today.      Relevant Medications   sitaGLIPtin (JANUVIA) 100 MG tablet   Other Relevant Orders   Lipid panel   Hemoglobin 123456   Basic metabolic panel       Follow up plan: Return in about 5 months (around 03/21/2016).

## 2015-10-23 NOTE — Assessment & Plan Note (Signed)
viagra ineffective. Discussed other treatment options with patient. Monitor for now.

## 2015-10-23 NOTE — Assessment & Plan Note (Signed)
Overdue for DM f/u. On januvia 100mg  not metformin. Check A1c today.

## 2015-10-23 NOTE — Assessment & Plan Note (Signed)
Check TSH today

## 2015-10-23 NOTE — Patient Instructions (Addendum)
Bring in left over ambien 12.5mg  CR. Let's transition to [employer] IR 5mg  daily as needed for sleep. Ok to use benadryl as needed 25mg  for sleep, look into melatonin 3mg  for sleep.  Labwork today. Return after 03/12/2016 for physical.

## 2015-10-24 ENCOUNTER — Telehealth: Payer: Self-pay | Admitting: Family Medicine

## 2015-10-24 LAB — LIPID PANEL
CHOL/HDL RATIO: 4
Cholesterol: 201 mg/dL — ABNORMAL HIGH (ref 0–200)
HDL: 51.4 mg/dL (ref >39.00)
LDL CALC: 131 mg/dL — AB (ref 0–99)
NonHDL: 149.14
TRIGLYCERIDES: 92 mg/dL (ref 0.0–149.0)
VLDL: 18.4 mg/dL (ref 0.0–40.0)

## 2015-10-24 LAB — BASIC METABOLIC PANEL
BUN: 13 mg/dL (ref 6–23)
CALCIUM: 10 mg/dL (ref 8.4–10.5)
CO2: 24 meq/L (ref 19–32)
CREATININE: 0.83 mg/dL (ref 0.40–1.50)
Chloride: 106 mEq/L (ref 96–112)
GFR: 99.33 mL/min (ref >60.00)
GLUCOSE: 153 mg/dL — AB (ref 70–99)
Potassium: 4.4 mEq/L (ref 3.5–5.1)
Sodium: 140 mEq/L (ref 135–145)

## 2015-10-24 LAB — TSH: TSH: 2.16 u[IU]/mL (ref 0.35–4.50)

## 2015-10-24 LAB — HEMOGLOBIN A1C: Hgb A1c MFr Bld: 6.7 % — ABNORMAL HIGH (ref 4.6–6.5)

## 2015-10-24 NOTE — Telephone Encounter (Signed)
Pt returning call regarding labs  cb number is [phone] Thanks

## 2015-10-25 ENCOUNTER — Other Ambulatory Visit: Payer: Self-pay | Admitting: Family Medicine

## 2015-10-25 MED ORDER — RED YEAST RICE 600 MG PO CAPS: 1.0000 | ORAL_CAPSULE | Freq: Two times a day (BID) | ORAL | Status: DC

## 2015-10-25 NOTE — Telephone Encounter (Signed)
Kevin Sheppard still had the bottle I got it from her and showed Dr. Darnell Level before I placed the pills in the pill container

## 2015-10-25 NOTE — Telephone Encounter (Signed)
I did not receive ambien bottle in my in box. plz ensure was disposed of. Thanks.

## 2015-10-25 NOTE — Telephone Encounter (Signed)
Pt's spouse dropped off Ambien bottle for PCP, placed in PCP in box.  Retrieved new script from [facility] desk, gave to spouse with copy of labs.

## 2015-10-30 LAB — HM DIABETES EYE EXAM

## 2015-11-10 ENCOUNTER — Encounter: Payer: Self-pay | Admitting: Family Medicine

## 2015-11-22 ENCOUNTER — Other Ambulatory Visit: Payer: Self-pay | Admitting: Family Medicine

## 2015-11-23 NOTE — Telephone Encounter (Signed)
phoned in #30 RF0. Continued taper off med.

## 2015-11-23 NOTE — Telephone Encounter (Signed)
Ok to refill 

## 2016-02-25 ENCOUNTER — Telehealth: Payer: Self-pay

## 2016-02-25 NOTE — Telephone Encounter (Signed)
Pt left v/m that sounded like he was requesting generic insulin. I do not find generic insulin on current or hx med list; left v/m requesting pt to [facility]. [facility].

## 2016-02-26 NOTE — Telephone Encounter (Signed)
Patient returned Rena's call. Patient was asking for generic thyroid medication.  Patient was taking the generic before, but started taking Synthroid when he had some issues.  Patient said the issues haven't gotten better and would rather go back to the generic.  The generic is only $5.  [facility]. Patient only has enough medication until Thursday.

## 2016-02-26 NOTE — Telephone Encounter (Signed)
Ok to change back to generic?

## 2016-02-27 MED ORDER — LEVOTHYROXINE SODIUM 100 MCG PO TABS: 100.0000 ug | ORAL_TABLET | Freq: Every day | ORAL | Status: DC

## 2016-02-27 NOTE — Telephone Encounter (Signed)
plz notify generic levothyroxine sent to pharmacy 75mo supply. Recheck levels in 2 months.  Lab Results  Component Value Date   TSH 2.16 10/23/2015

## 2016-02-27 NOTE — Telephone Encounter (Signed)
Message left advising patient and advised to call and schedule lab appt for a day in August that was convenient for him.

## 2016-02-27 NOTE — Telephone Encounter (Signed)
Lab appt scheduled.

## 2016-02-28 ENCOUNTER — Other Ambulatory Visit: Payer: Self-pay | Admitting: Family Medicine

## 2016-02-28 NOTE — Telephone Encounter (Signed)
Ok to refill 

## 2016-02-29 NOTE — Telephone Encounter (Signed)
Last visit our goal was to taper off ambien and instead use melatonin/benadryl. How is he doing with this plan?  Ok to phone in.

## 2016-02-29 NOTE — Telephone Encounter (Signed)
Rx called in as directed. Message left for patient to return my call and advise about taper.

## 2016-04-07 ENCOUNTER — Telehealth: Payer: Self-pay | Admitting: Family Medicine

## 2016-04-07 MED ORDER — IBUPROFEN 800 MG PO TABS: 800.0000 mg | ORAL_TABLET | Freq: Two times a day (BID) | ORAL | 1 refills | Status: DC | PRN

## 2016-04-07 NOTE — Telephone Encounter (Signed)
Sent in

## 2016-04-07 NOTE — Telephone Encounter (Signed)
Pt is requesting an 800 mg prescription for Ibuprofen.  States he used to get this from previous provider for pain in his knees and shoulder.  Best number to call is [phone]

## 2016-04-21 ENCOUNTER — Other Ambulatory Visit: Payer: Self-pay | Admitting: Family Medicine

## 2016-04-21 NOTE — Telephone Encounter (Signed)
plz phone in. 

## 2016-04-21 NOTE — Telephone Encounter (Signed)
Ok to refill? Last filled 02/29/16 #30 0RF. Patient has not started to taper off med yet. He has appt with you at the end of the month and will discuss it with you at that time. He said he still isn't sleeping well.

## 2016-04-22 NOTE — Telephone Encounter (Signed)
Rx called to pharmacy as instructed. 

## 2016-04-27 ENCOUNTER — Other Ambulatory Visit: Payer: Self-pay | Admitting: Family Medicine

## 2016-04-27 DIAGNOSIS — E039 Hypothyroidism, unspecified: Secondary | ICD-10-CM

## 2016-04-27 DIAGNOSIS — E119 Type 2 diabetes mellitus without complications: Secondary | ICD-10-CM

## 2016-04-27 DIAGNOSIS — E785 Hyperlipidemia, unspecified: Secondary | ICD-10-CM

## 2016-04-27 DIAGNOSIS — Z1159 Encounter for screening for other viral diseases: Secondary | ICD-10-CM

## 2016-04-27 DIAGNOSIS — Z125 Encounter for screening for malignant neoplasm of prostate: Secondary | ICD-10-CM

## 2016-04-29 ENCOUNTER — Other Ambulatory Visit (INDEPENDENT_AMBULATORY_CARE_PROVIDER_SITE_OTHER): Payer: Managed Care, Other (non HMO)

## 2016-04-29 DIAGNOSIS — E785 Hyperlipidemia, unspecified: Secondary | ICD-10-CM

## 2016-04-29 DIAGNOSIS — E119 Type 2 diabetes mellitus without complications: Secondary | ICD-10-CM | POA: Diagnosis not present

## 2016-04-29 DIAGNOSIS — Z125 Encounter for screening for malignant neoplasm of prostate: Secondary | ICD-10-CM | POA: Diagnosis not present

## 2016-04-29 DIAGNOSIS — Z1159 Encounter for screening for other viral diseases: Secondary | ICD-10-CM

## 2016-04-29 DIAGNOSIS — E039 Hypothyroidism, unspecified: Secondary | ICD-10-CM | POA: Diagnosis not present

## 2016-04-29 LAB — BASIC METABOLIC PANEL
BUN: 15 mg/dL (ref 6–23)
CHLORIDE: 106 meq/L (ref 96–112)
CO2: 24 meq/L (ref 19–32)
Calcium: 9.4 mg/dL (ref 8.4–10.5)
Creatinine, Ser: 0.69 mg/dL (ref 0.40–1.50)
GFR: 122.73 mL/min (ref >60.00)
Glucose, Bld: 125 mg/dL — ABNORMAL HIGH (ref 70–99)
POTASSIUM: 4 meq/L (ref 3.5–5.1)
SODIUM: 139 meq/L (ref 135–145)

## 2016-04-29 LAB — LIPID PANEL
CHOLESTEROL: 180 mg/dL (ref 0–200)
HDL: 55.5 mg/dL (ref >39.00)
LDL Cholesterol: 106 mg/dL — ABNORMAL HIGH (ref 0–99)
NonHDL: 124.41
TRIGLYCERIDES: 93 mg/dL (ref 0.0–149.0)
Total CHOL/HDL Ratio: 3
VLDL: 18.6 mg/dL (ref 0.0–40.0)

## 2016-04-29 LAB — PSA: PSA: 0.57 ng/mL (ref 0.10–4.00)

## 2016-04-29 LAB — HEMOGLOBIN A1C: Hgb A1c MFr Bld: 6.1 % (ref 4.6–6.5)

## 2016-04-29 LAB — TSH: TSH: 1.77 u[IU]/mL (ref 0.35–4.50)

## 2016-04-29 LAB — MICROALBUMIN / CREATININE URINE RATIO
CREATININE, U: 157.9 mg/dL
MICROALB UR: 0.7 mg/dL (ref 0.0–1.9)
MICROALB/CREAT RATIO: 0.4 mg/g (ref 0.0–30.0)

## 2016-04-30 LAB — HEPATITIS C ANTIBODY: HCV Ab: NEGATIVE

## 2016-05-02 ENCOUNTER — Other Ambulatory Visit: Payer: Self-pay | Admitting: Family Medicine

## 2016-05-02 NOTE — Telephone Encounter (Signed)
Ok to refill? Last filled 04/07/16 #30 1RF

## 2016-05-06 ENCOUNTER — Ambulatory Visit (INDEPENDENT_AMBULATORY_CARE_PROVIDER_SITE_OTHER): Payer: Managed Care, Other (non HMO) | Admitting: Family Medicine

## 2016-05-06 ENCOUNTER — Encounter: Payer: Self-pay | Admitting: Family Medicine

## 2016-05-06 ENCOUNTER — Encounter: Payer: Self-pay | Admitting: *Deleted

## 2016-05-06 VITALS — BP 100/70 | HR 50 | Ht 64.25 in | Wt 165.8 lb

## 2016-05-06 DIAGNOSIS — E785 Hyperlipidemia, unspecified: Secondary | ICD-10-CM

## 2016-05-06 DIAGNOSIS — E119 Type 2 diabetes mellitus without complications: Secondary | ICD-10-CM

## 2016-05-06 DIAGNOSIS — M7521 Bicipital tendinitis, right shoulder: Secondary | ICD-10-CM | POA: Insufficient documentation

## 2016-05-06 DIAGNOSIS — Z0000 Encounter for general adult medical examination without abnormal findings: Secondary | ICD-10-CM

## 2016-05-06 DIAGNOSIS — E039 Hypothyroidism, unspecified: Secondary | ICD-10-CM

## 2016-05-06 DIAGNOSIS — G4726 Circadian rhythm sleep disorder, shift work type: Secondary | ICD-10-CM

## 2016-05-06 MED ORDER — SITAGLIPTIN PHOSPHATE 50 MG PO TABS: 50.0000 mg | ORAL_TABLET | Freq: Every day | ORAL | 11 refills | Status: DC

## 2016-05-06 MED ORDER — SILDENAFIL CITRATE 100 MG PO TABS: 50.0000 mg | ORAL_TABLET | Freq: Every day | ORAL | 6 refills | Status: DC | PRN

## 2016-05-06 NOTE — Assessment & Plan Note (Signed)
Chronic, great control off meds with healthy diet changes.

## 2016-05-06 NOTE — Assessment & Plan Note (Signed)
Preventative protocols reviewed and updated unless pt declined. Discussed healthy diet and lifestyle.  

## 2016-05-06 NOTE — Patient Instructions (Addendum)
Pass by lab to pick up stool kit.  Consider tetanus shot next year.  Decrease janvuia to 1m daily - new dose at pharmacy.  Pass by lab for updated controlled substance agreement. [place]  Health Maintenance, Male A healthy lifestyle and preventative care can promote health and wellness.  Maintain regular health, dental, and eye exams.  Eat a healthy diet. Foods like vegetables, fruits, whole grains, low-fat dairy products, and lean protein foods contain the nutrients you need and are low in calories. Decrease your intake of foods high in solid fats, added sugars, and salt. Get information about a proper diet from your health care provider, if necessary.  Regular physical exercise is one of the most important things you can do for your health. Most adults should get at least 150 minutes of moderate-intensity exercise (any activity that increases your heart rate and causes you to sweat) each week. In addition, most adults need muscle-strengthening exercises on 2 or more days a week.   Maintain a healthy weight. The body mass index (BMI) is a screening tool to identify possible weight problems. It provides an estimate of body fat based on height and weight. Your health care provider can find your BMI and can help you achieve or maintain a healthy weight. For males 20 years and older:  A BMI below 18.5 is considered underweight.  A BMI of 18.5 to 24.9 is normal.  A BMI of 25 to 29.9 is considered overweight.  A BMI of 30 and above is considered obese.  Maintain normal blood lipids and cholesterol by exercising and minimizing your intake of saturated fat. Eat a balanced diet with plenty of fruits and vegetables. Blood tests for lipids and cholesterol should begin at age 4666and be repeated every 5 years. If your lipid or cholesterol levels are high, you are over age 64 or you are at high risk for heart disease, you may need your cholesterol levels checked more  frequently.Ongoing high lipid and cholesterol levels should be treated with medicines if diet and exercise are not working.  If you smoke, find out from your health care provider how to quit. If you do not use tobacco, do not start.  Lung cancer screening is recommended for adults aged 540-80years who are at high risk for developing lung cancer because of a history of smoking. A yearly low-dose CT scan of the lungs is recommended for people who have at least a 30-pack-year history of smoking and are current smokers or have quit within the past 15 years. A pack year of smoking is smoking an average of 1 pack of cigarettes a day for 1 year (for example, a 30-pack-year history of smoking could mean smoking 1 pack a day for 30 years or 2 packs a day for 15 years). Yearly screening should continue until the smoker has stopped smoking for at least 15 years. Yearly screening should be stopped for people who develop a health problem that would prevent them from having lung cancer treatment.  If you choose to drink alcohol, do not have more than 2 drinks per day. One drink is considered to be 12 oz (360 mL) of beer, 5 oz (150 mL) of wine, or 1.5 oz (45 mL) of liquor.  Avoid the use of street drugs. Do not share needles with anyone. Ask for help if you need support or instructions about stopping the use of drugs.  High blood pressure causes heart disease and increases the risk of  stroke. High blood pressure is more likely to develop in:  People who have blood pressure in the end of the normal range (100-139/85-89 mm Hg).  People who are overweight or obese.  People who are African American.  If you are 28-19 years of age, have your blood pressure checked every 3-5 years. If you are 37 years of age or older, have your blood pressure checked every year. You should have your blood pressure measured twice--once when you are at a hospital or clinic, and once when you are not at a hospital or clinic. Record the  average of the two measurements. To check your blood pressure when you are not at a hospital or clinic, you can use:  An automated blood pressure machine at a pharmacy.  A home blood pressure monitor.  If you are 66-53 years old, ask your health care provider if you should take aspirin to prevent heart disease.  Diabetes screening involves taking a blood sample to check your fasting blood sugar level. This should be done once every 3 years after age 44 if you are at a normal weight and without risk factors for diabetes. Testing should be considered at a younger age or be carried out more frequently if you are overweight and have at least 1 risk factor for diabetes.  Colorectal cancer can be detected and often prevented. Most routine colorectal cancer screening begins at the age of 83 and continues through age 68. However, your health care provider may recommend screening at an earlier age if you have risk factors for colon cancer. On a yearly basis, your health care provider may provide home test kits to check for hidden blood in the stool. A small camera at the end of a tube may be used to directly examine the colon (sigmoidoscopy or colonoscopy) to detect the earliest forms of colorectal cancer. Talk to your health care provider about this at age 78 when routine screening begins. A direct exam of the colon should be repeated every 5-10 years through age 75, unless early forms of precancerous polyps or small growths are found.  People who are at an increased risk for hepatitis B should be screened for this virus. You are considered at high risk for hepatitis B if:  You were born in a country where hepatitis B occurs often. Talk with your health care provider about which countries are considered high risk.  Your parents were born in a high-risk country and you have not received a shot to protect against hepatitis B (hepatitis B vaccine).  You have HIV or AIDS.  You use needles to inject street  drugs.  You live with, or have sex with, someone who has hepatitis B.  You are a man who has sex with other men (MSM).  You get hemodialysis treatment.  You take certain medicines for conditions like cancer, organ transplantation, and autoimmune conditions.  Hepatitis C blood testing is recommended for all people born from [birth year] through 1964 and any individual with known risk factors for hepatitis C.  Healthy men should no longer receive prostate-specific antigen (PSA) blood tests as part of routine cancer screening. Talk to your health care provider about prostate cancer screening.  Testicular cancer screening is not recommended for adolescents or adult males who have no symptoms. Screening includes self-exam, a health care provider exam, and other screening tests. Consult with your health care provider about any symptoms you have or any concerns you have about testicular cancer.  Practice safe sex. Use  condoms and avoid high-risk sexual practices to reduce the spread of sexually transmitted infections (STIs).  You should be screened for STIs, including gonorrhea and chlamydia if:  You are sexually active and are younger than 24 years.  You are older than 24 years, and your health care provider tells you that you are at risk for this type of infection.  Your sexual activity has changed since you were last screened, and you are at an increased risk for chlamydia or gonorrhea. Ask your health care provider if you are at risk.  If you are at risk of being infected with HIV, it is recommended that you take a prescription medicine daily to prevent HIV infection. This is called pre-exposure prophylaxis (PrEP). You are considered at risk if:  You are a man who has sex with other men (MSM).  You are a heterosexual man who is sexually active with multiple partners.  You take drugs by injection.  You are sexually active with a partner who has HIV.  Talk with your health care provider about  whether you are at high risk of being infected with HIV. If you choose to begin PrEP, you should first be tested for HIV. You should then be tested every 3 months for as long as you are taking PrEP.  Use sunscreen. Apply sunscreen liberally and repeatedly throughout the day. You should seek shade when your shadow is shorter than you. Protect yourself by wearing long sleeves, pants, a wide-brimmed hat, and sunglasses year round whenever you are outdoors.  Tell your health care provider of new moles or changes in moles, especially if there is a change in shape or color. Also, tell your health care provider if a mole is larger than the size of a pencil eraser.  A one-time screening for abdominal aortic aneurysm (AAA) and surgical repair of large AAAs by ultrasound is recommended for men aged 2-75 years who are current or former smokers.  Stay current with your vaccines (immunizations).   This information is not intended to replace advice given to you by your health care provider. Make sure you discuss any questions you have with your health care provider.   Document Released: 02/28/2008 Document Revised: 09/22/2014 Document Reviewed: 01/27/2011 Elsevier Interactive Patient Education [employer].

## 2016-05-06 NOTE — Assessment & Plan Note (Signed)
Exam today consistent with biceps tendonitis with positive speed test - SM pt advisor exercises provided today.

## 2016-05-06 NOTE — Progress Notes (Signed)
BP 100/70   Pulse (!) 50   Ht 5' 4.25" (1.632 m)   Wt 165 lb 12.8 oz (75.2 kg)   SpO2 97%   BMI 28.24 kg/m    CC: CPE Subjective:    Patient ID: Kevin Nall., male    DOB: 11-01-1950, 65 y.o.   MRN: [id number]  HPI: Kevin Schappell. is a 65 y.o. 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 See prior note for details. Concern for dependence on medication. Last visit we transitioned to ambien IR 556mwith goal to taper off. Pt tried benadryl and melatonin. Endorses sleeping only 4 hours/day.   R>L shoulder pain. Worse pain at night time when laying on shoulder. Over last 2 weeks painting house - and noticing worsening L shoulder pain as well. Ibuprofen helps. Heating pad helps too. Has been seeing chiropractor for this as well. Has bought new bed   Preventative: Colonoscopy - benign polyps 2004, no polyps 2009 rpt 5 yrs (Dr [place] Requests stool kit today. Prostate cancer screening - discussed. Would like to continue.  Flu shot receives yearly Tetanus pt thinks recently done Prevnar 05/2014. zostavax - discussed. Will check with insurance.  Seat belt use discussed Sunscreen use discussed. No changing moles.   He lives with wife. 2 dogs. They have grown 4 children and 3 grandchildren  He works on cigarette machines, 3rd shift  Activity: goes to gym regularly - staying active with cardio Diet: good water, fruits/vegetables daily   Relevant past medical, surgical, family and social history reviewed and updated as indicated. Interim medical history since our last visit reviewed. Allergies and medications reviewed and updated. Current Outpatient Prescriptions on File Prior to Visit  Medication Sig  . ibuprofen (ADVIL,MOTRIN) 800 MG tablet TAKE 1 TABLET (800 MG TOTAL) BY MOUTH 2 (TWO) TIMES DAILY AS NEEDED. REPORTED ON 10/23/2015  . levothyroxine (SYNTHROID) 100 MCG tablet Take  1 tablet (100 mcg total) by mouth daily before breakfast.  . Melatonin 3 MG CAPS Take 1 capsule (3 mg total) by mouth at bedtime as needed (sleep).  . Multiple Vitamins-Minerals (MULTIVITAMIN ADULTS 50+ PO) Take 1 tablet by mouth daily.  . Omega-3 Fatty Acids (FISH OIL) 1000 MG CAPS Take 1 capsule by mouth daily.  . Marland Kitchenolpidem (AMBIEN) 5 MG tablet TAKE 1 TABLET BY MOUTH AT BEDTIME AS NEEDED   No current facility-administered medications on file prior to visit.     Review of Systems  Constitutional: Negative for activity change, appetite change, chills, fatigue, fever and unexpected weight change.  HENT: Negative for hearing loss.   Eyes: Negative for visual disturbance.  Respiratory: Negative for cough, chest tightness, shortness of breath and wheezing.   Cardiovascular: Negative for chest pain, palpitations and leg swelling.  Gastrointestinal: Negative for abdominal distention, abdominal pain, blood in stool, constipation, diarrhea, nausea and vomiting.  Genitourinary: Negative for difficulty urinating and hematuria.  Musculoskeletal: Negative for arthralgias, myalgias and neck pain.  Skin: Negative for rash.  Neurological: Negative for dizziness, seizures, syncope and headaches.  Hematological: Negative for adenopathy. Does not bruise/bleed easily.  Psychiatric/Behavioral: Negative for dysphoric mood. The patient is not nervous/anxious.    Per HPI unless specifically indicated in ROS section     Objective:    BP 100/70   Pulse (!) 50   Ht 5' 4.25" (1.632 m)   Wt 165 lb 12.8 oz (75.2  kg)   SpO2 97%   BMI 28.24 kg/m   Wt Readings from Last 3 Encounters:  05/06/16 165 lb 12.8 oz (75.2 kg)  10/23/15 184 lb (83.5 kg)  05/15/15 184 lb 12 oz (83.8 kg)    Physical Exam  Constitutional: He is oriented to person, place, and time. He appears well-developed and well-nourished. No distress.  HENT:  Head: Normocephalic and atraumatic.  Right Ear: Hearing, tympanic membrane, external ear  and ear canal normal.  Left Ear: Hearing, tympanic membrane, external ear and ear canal normal.  Nose: Nose normal.  Mouth/Throat: Uvula is midline, oropharynx is clear and moist and mucous membranes are normal. No oropharyngeal exudate, posterior oropharyngeal edema or posterior oropharyngeal erythema.  Eyes: Conjunctivae and EOM are normal. Pupils are equal, round, and reactive to light. No scleral icterus.  Neck: Normal range of motion. Neck supple. No thyromegaly present.  Cardiovascular: Normal rate, regular rhythm, normal heart sounds and intact distal pulses.   No murmur heard. Pulses:      Radial pulses are 2+ on the right side, and 2+ on the left side.  Pulmonary/Chest: Effort normal and breath sounds normal. No respiratory distress. He has no wheezes. He has no rales.  Abdominal: Soft. Bowel sounds are normal. He exhibits no distension and no mass. There is no tenderness. There is no rebound and no guarding.  Genitourinary: Rectum normal and prostate normal. Rectal exam shows no external hemorrhoid, no internal hemorrhoid, no fissure, no mass, no tenderness and anal tone normal. Prostate is not enlarged and not tender.  Musculoskeletal: Normal range of motion. He exhibits no edema.  Lymphadenopathy:    He has no cervical adenopathy.  Neurological: He is alert and oriented to person, place, and time.  CN grossly intact, station and gait intact  Skin: Skin is warm and dry. No rash noted.  Psychiatric: He has a normal mood and affect. His behavior is normal. Judgment and thought content normal.  Nursing note and vitals reviewed.  Results for orders placed or performed in visit on 04/29/16  Lipid panel  Result Value Ref Range   Cholesterol 180 0 - 200 mg/dL   Triglycerides 93.0 0.0 - 149.0 mg/dL   HDL 55.50 >39.00 mg/dL   VLDL 18.6 0.0 - 40.0 mg/dL   LDL Cholesterol 106 (H) 0 - 99 mg/dL   Total CHOL/HDL Ratio 3    NonHDL 124.41   Hemoglobin A1c  Result Value Ref Range   Hgb  A1c MFr Bld 6.1 4.6 - 6.5 %  TSH  Result Value Ref Range   TSH 1.77 0.35 - 4.50 uIU/mL  PSA  Result Value Ref Range   PSA 0.57 0.10 - 4.00 ng/mL  Basic metabolic panel  Result Value Ref Range   Sodium 139 135 - 145 mEq/L   Potassium 4.0 3.5 - 5.1 mEq/L   Chloride 106 96 - 112 mEq/L   CO2 24 19 - 32 mEq/L   Glucose, Bld 125 (H) 70 - 99 mg/dL   BUN 15 6 - 23 mg/dL   Creatinine, Ser 0.69 0.40 - 1.50 mg/dL   Calcium 9.4 8.4 - 10.5 mg/dL   GFR 122.73 >60.00 mL/min  Hepatitis C antibody  Result Value Ref Range   HCV Ab NEGATIVE NEGATIVE  Microalbumin / creatinine urine ratio  Result Value Ref Range   Microalb, Ur 0.7 0.0 - 1.9 mg/dL   Creatinine,U 157.9 mg/dL   Microalb Creat Ratio 0.4 0.0 - 30.0 mg/g      Assessment &  Plan:   Problem List Items Addressed This Visit    Biceps tendonitis on right    Exam today consistent with biceps tendonitis with positive speed test - SM pt advisor exercises provided today.      Diabetes type 2, controlled (HCC)    Chronic, stable. Great control of A1c - will decrease januvia to 6m daily.      Relevant Medications   sitaGLIPtin (JANUVIA) 50 MG tablet   Health maintenance examination - Primary    Preventative protocols reviewed and updated unless pt declined. Discussed healthy diet and lifestyle.       HLD (hyperlipidemia)    Chronic, great control off meds with healthy diet changes.       Hypothyroidism    Chronic, stable. Continue current thyroid dose.      Shift work sleep disorder    Continue ambien 58mIR. Modest effect.        Other Visit Diagnoses   None.      Follow up plan: Return in about 6 months (around 11/06/2016) for follow up visit.  JaRia BushMD

## 2016-05-06 NOTE — Assessment & Plan Note (Signed)
Continue ambien 5mg  IR. Modest effect.

## 2016-05-06 NOTE — Assessment & Plan Note (Signed)
Chronic, stable. Continue current thyroid dose.

## 2016-05-06 NOTE — Addendum Note (Signed)
Addended by: Ria Bush on: 05/06/2016 04:16 PM   Modules accepted: Orders

## 2016-05-06 NOTE — Assessment & Plan Note (Signed)
Chronic, stable. Great control of A1c - will decrease januvia to 50mg  daily.

## 2016-05-06 NOTE — Progress Notes (Signed)
Pre visit review using our clinic review tool, if applicable. No additional management support is needed unless otherwise documented below in the visit note. 

## 2016-05-15 ENCOUNTER — Other Ambulatory Visit: Payer: Self-pay | Admitting: Family Medicine

## 2016-05-18 ENCOUNTER — Other Ambulatory Visit: Payer: Self-pay | Admitting: Family Medicine

## 2016-05-23 ENCOUNTER — Telehealth: Payer: Self-pay

## 2016-05-23 DIAGNOSIS — E119 Type 2 diabetes mellitus without complications: Secondary | ICD-10-CM

## 2016-05-23 MED ORDER — METFORMIN HCL 500 MG PO TABS: 500.0000 mg | ORAL_TABLET | Freq: Two times a day (BID) | ORAL | 3 refills | Status: DC

## 2016-05-23 NOTE — Telephone Encounter (Signed)
Patient notified and verbalized understanding. 

## 2016-05-23 NOTE — Telephone Encounter (Signed)
plz notify metformin 500mg  sent in. Start 1 tablet daily for 1 wk then increase to twice daily, take with food.

## 2016-05-23 NOTE — Telephone Encounter (Signed)
Pt left v/m Januvia is more expensive than pt wants to pay; pt wants changed to metformin 50 mg due to cost. Pt request cb. [place]

## 2016-06-08 ENCOUNTER — Other Ambulatory Visit: Payer: Self-pay | Admitting: Family Medicine

## 2016-06-09 NOTE — Telephone Encounter (Signed)
Ok to refill? Last filled 05/18/16 #[id number]

## 2016-07-15 ENCOUNTER — Other Ambulatory Visit: Payer: Self-pay | Admitting: Family Medicine

## 2016-07-25 ENCOUNTER — Encounter: Payer: Self-pay | Admitting: Emergency Medicine

## 2016-07-25 ENCOUNTER — Emergency Department
Admission: EM | Admit: 2016-07-25 | Discharge: 2016-07-25 | Disposition: A | Discharge status: Home / Self Care | Payer: Managed Care, Other (non HMO) | Attending: Emergency Medicine | Admitting: Emergency Medicine

## 2016-07-25 DIAGNOSIS — E119 Type 2 diabetes mellitus without complications: Secondary | ICD-10-CM | POA: Diagnosis not present

## 2016-07-25 DIAGNOSIS — Z79899 Other long term (current) drug therapy: Secondary | ICD-10-CM | POA: Diagnosis not present

## 2016-07-25 DIAGNOSIS — Z7984 Long term (current) use of oral hypoglycemic drugs: Secondary | ICD-10-CM | POA: Insufficient documentation

## 2016-07-25 DIAGNOSIS — W01198A Fall on same level from slipping, tripping and stumbling with subsequent striking against other object, initial encounter: Secondary | ICD-10-CM | POA: Insufficient documentation

## 2016-07-25 DIAGNOSIS — Y9301 Activity, walking, marching and hiking: Secondary | ICD-10-CM | POA: Insufficient documentation

## 2016-07-25 DIAGNOSIS — Y92009 Unspecified place in unspecified non-institutional (private) residence as the place of occurrence of the external cause: Secondary | ICD-10-CM | POA: Diagnosis not present

## 2016-07-25 DIAGNOSIS — S0101XA Laceration without foreign body of scalp, initial encounter: Secondary | ICD-10-CM | POA: Diagnosis not present

## 2016-07-25 DIAGNOSIS — S0990XA Unspecified injury of head, initial encounter: Secondary | ICD-10-CM | POA: Diagnosis present

## 2016-07-25 DIAGNOSIS — E039 Hypothyroidism, unspecified: Secondary | ICD-10-CM | POA: Insufficient documentation

## 2016-07-25 DIAGNOSIS — Y999 Unspecified external cause status: Secondary | ICD-10-CM | POA: Insufficient documentation

## 2016-07-25 MED ORDER — LIDOCAINE-EPINEPHRINE (PF) 1 %-1:200000 IJ SOLN
30.0000 mL | Freq: Once | INTRAMUSCULAR | Status: AC
  Administered 2016-07-25: 30 mL

## 2016-07-25 MED ORDER — LIDOCAINE-EPINEPHRINE 1 %-1:100000 IJ SOLN
10.0000 mL | Freq: Once | INTRAMUSCULAR | Status: DC
  Filled 2016-07-25: qty 10

## 2016-07-25 MED ORDER — LIDOCAINE-EPINEPHRINE (PF) 1 %-1:200000 IJ SOLN
INTRAMUSCULAR | Status: AC
  Administered 2016-07-25: 30 mL
  Filled 2016-07-25: qty 30

## 2016-07-25 NOTE — ED Triage Notes (Signed)
Pt to ed with c/o abrasion to top of head.  Pt states he tripped and fell scraping the top of his head on a cement wall.

## 2016-07-25 NOTE — Discharge Instructions (Signed)
We offered you a ct scan and tetanus shot but you would prefer not to have one at this time.  If you have symptoms of concussion as we discussed or you feel worse in any way, return to the emergency department. If you have signs of infection, fever, redness, heat, pain, or you have any other concerns return to the emergency department.  See your doctor in 7 - 10 days for removal.

## 2016-07-25 NOTE — ED Provider Notes (Signed)
[facility] Emergency Department Provider Note  ____________________________________________   I have reviewed the triage vital signs and the nursing notes.   HISTORY  Chief Complaint Abrasion    HPI Kevin Sheppard. is a 65 y.o. male who is not on any blood thinners, he states he tripped while walking and he scuffed his head against a retaining wall that he has at home. He states that he is not on any blood thinners or Coumadin or aspirin. He did not pass out. He states "I barely bumped it." The patient states that he has no headache or lightheadedness. I discussed CT scan with him given his age and he refuses. He says "I don't need any of that". Does not have any concussion symptoms. This is a non-syncopal fall. He tripped on a garden implement     Past Medical History:  Diagnosis Date  . Abnormal drug screen 8/2-05/2015   +MJ pt states one time thing (04/2015)  . Diabetes type 2, controlled ([place])   . History of chicken pox   . History of colon polyps    benign  . HLD (hyperlipidemia)   . Hypothyroid   . 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 Biceps tendonitis on right 05/06/2016  . Health maintenance examination 03/13/2015  . Undescended left testicle   . Benign skin lesion of nose 10/30/2014  . Erectile dysfunction 10/30/2014  . 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 HLD (hyperlipidemia)   . Peripheral neuropathy ([place]) 12/07/2013  . Hypothyroidism 10/26/2013  . Diabetes type 2, controlled ([place]) 10/26/2013    Past Surgical History:  Procedure Laterality Date  . COLONOSCOPY  2004;2009   first with b9 polyps, then diverticulosis no polyps, rpt 42yrs ([place])  . MENISCUS REPAIR Bilateral 2002;2008    Prior to Admission medications   Medication Sig Start Date End Date Taking? Authorizing Provider   ibuprofen (ADVIL,MOTRIN) 800 MG tablet TAKE 1 TABLET (800 MG TOTAL) BY MOUTH 2 (TWO) TIMES DAILY AS NEEDED. REPORTED ON 10/23/2015 07/15/16   Ria Bush, MD  levothyroxine (SYNTHROID) 100 MCG tablet Take 1 tablet (100 mcg total) by mouth daily before breakfast. 02/27/16   Ria Bush, MD  Melatonin 3 MG CAPS Take 1 capsule (3 mg total) by mouth at bedtime as needed (sleep). 10/23/15   Ria Bush, MD  metFORMIN (GLUCOPHAGE) 500 MG tablet Take 1 tablet (500 mg total) by mouth 2 (two) times daily with a meal. First week take one daily 05/23/16   Ria Bush, MD  Multiple Vitamins-Minerals (MULTIVITAMIN ADULTS 50+ PO) Take 1 tablet by mouth daily.    Historical Provider, MD  Omega-3 Fatty Acids (FISH OIL) 1000 MG CAPS Take 1 capsule by mouth daily.    Historical Provider, MD  sildenafil (VIAGRA) 100 MG tablet Take 0.5-1 tablets (50-100 mg total) by mouth daily as needed for erectile dysfunction. 05/06/16   Ria Bush, MD  sitaGLIPtin (JANUVIA) 50 MG tablet Take 1 tablet (50 mg total) by mouth daily. 05/18/16   Ria Bush, MD  zolpidem (AMBIEN) 5 MG tablet TAKE 1 TABLET BY MOUTH AT BEDTIME AS NEEDED 04/21/16   Ria Bush, MD    Allergies Lyrica [pregabalin]  Family History  Problem Relation Age of Onset  . Stroke Father   . Diabetes Father   . Other Mother     blood  disease, died 73 (?porphyria)  . Diabetes type II Brother   . Diabetes type II Maternal Aunt   . Hypertension Sister   . Stroke Sister 41  . Thyroid disease Sister   . Cancer Neg Hx   . CAD Neg Hx     Social History Social History  Substance Use Topics  . Smoking status: Never Smoker  . Smokeless tobacco: Never Used  . Alcohol use 0.0 oz/week     Comment: 12pack every weekend.     Review of Systems Constitutional: No fever/chills Eyes: No visual changes. ENT: No sore throat. No stiff neck no neck pain Cardiovascular: Denies chest pain. Respiratory: Denies shortness of  breath. Gastrointestinal:   no vomiting.  No diarrhea.  No constipation. Genitourinary: Negative for dysuria. Musculoskeletal: Negative lower extremity swelling Skin: Negative for rash. Neurological: Negative for severe headaches, focal weakness or numbness. 10-point ROS otherwise negative.  ____________________________________________   PHYSICAL EXAM:  VITAL SIGNS: ED Triage Vitals  Enc Vitals Group     BP 07/25/16 1506 116/65     Pulse Rate 07/25/16 1506 69     Resp 07/25/16 1506 16     Temp 07/25/16 1506 98.1 F (36.7 C)     Temp Source 07/25/16 1506 Oral     SpO2 07/25/16 1506 96 %     Weight 07/25/16 1506 165 lb (74.8 kg)     Height 07/25/16 1506 5\' 5"  (1.651 m)     Head Circumference --      Peak Flow --      Pain Score 07/25/16 1509 1     Pain Loc --      Pain Edu? --      Excl. in [place]? --     Constitutional: Alert and oriented. Well appearing and in no acute distress. Eyes: Conjunctivae are normal. PERRL. EOMI. Head: There is a abrasion to the scalp above the hairline in the front towards the right, there is a laceration there as well approximately 3.2 cm. Bleeding is controlled there is a surrounding abrasion. There is no evidence of galea rupture. No skull fracture palpated. Nose: No congestion/rhinnorhea. Mouth/Throat: Mucous membranes are moist.  Oropharynx non-erythematous. Neck: No stridor.   Nontender with no meningismus Cardiovascular: Normal rate, regular rhythm. Grossly normal heart sounds.  Good peripheral circulation. Respiratory: Normal respiratory effort.  No retractions. Lungs CTAB. Abdominal: Soft and nontender. No distention. No guarding no rebound Back:  There is no focal tenderness or step off.  there is no midline tenderness there are no lesions noted. there is no CVA tenderness Musculoskeletal: No lower extremity tenderness, no upper extremity tenderness. No joint effusions, no DVT signs strong distal pulses no edema Neurologic:  Normal speech  and language. No gross focal neurologic deficits are appreciated.  Skin:  Skin is warm, dry and intact. No rash noted. Psychiatric: Mood and affect are normal. Speech and behavior are normal.  ____________________________________________   LABS (all labs ordered are listed, but only abnormal results are displayed)  Labs Reviewed - No data to display ____________________________________________  EKG  I personally interpreted any EKGs ordered by me or triage  ____________________________________________  RADIOLOGY  I reviewed any imaging ordered by me or triage that were performed during my shift and, if possible, patient and/or family made aware of any abnormal findings. ____________________________________________   PROCEDURES  Procedure(s) performed: LACERATION REPAIR Performed by: Schuyler Amor Authorized by: Schuyler Amor Consent: Verbal consent obtained. Risks and benefits: risks, benefits and alternatives were discussed  Consent given by: patient Patient identity confirmed: provided demographic data Prepped and Draped in normal sterile fashion Wound explored  Laceration Location: Scalp  Laceration Length: 3 cm  No Foreign Bodies seen or palpated  Anesthesia: local infiltration  Local anesthetic: lidocaine 1 % with epinephrine  Anesthetic total: 5 ml  Irrigation method: syringe Amount of cleaning: standard  Skin closure: Staples   Number of staples: 5   Technique: Interrupted   Patient tolerance: Patient tolerated the procedure well with no immediate complications.   Procedures  Critical Care performed: None  ____________________________________________   INITIAL IMPRESSION / ASSESSMENT AND PLAN / ED COURSE  Pertinent labs & imaging results that were available during my care of the patient were reviewed by me and considered in my medical decision making (see chart for details).  Patient had a non-syncopal fall sustaining a laceration to  his head. No evidence of concussion altered mental status or other issues. GCS is 15. He refuses CT scan which I don't think is unreasonable. He understands that CT scan I can't rule out intracranial hemorrhage although again I have very low suspicion. The only real reason to do it would be because of his age. In any event, we will repair his laceration and assess his tetanus status.  ----------------------------------------- 4:02 PM on 07/25/2016 -----------------------------------------  A short refuses tetanus shot states he is up-to-date. Continues to decline offered CT which I think is not unreasonable but again has been offered. Patient remains neurologically intact.  Clinical Course    ____________________________________________   FINAL CLINICAL IMPRESSION(S) / ED DIAGNOSES  Final diagnoses:  None      This chart was dictated using voice recognition software.  Despite best efforts to proofread,  errors can occur which can change meaning.      Schuyler Amor, MD 07/25/16 [phone]

## 2016-08-05 ENCOUNTER — Ambulatory Visit (INDEPENDENT_AMBULATORY_CARE_PROVIDER_SITE_OTHER): Payer: Managed Care, Other (non HMO) | Admitting: Internal Medicine

## 2016-08-05 ENCOUNTER — Encounter: Payer: Self-pay | Admitting: Internal Medicine

## 2016-08-05 ENCOUNTER — Other Ambulatory Visit: Payer: Self-pay

## 2016-08-05 VITALS — BP 106/74 | HR 60 | Temp 98.7°F | Wt 171.0 lb

## 2016-08-05 DIAGNOSIS — Z4802 Encounter for removal of sutures: Secondary | ICD-10-CM

## 2016-08-05 NOTE — Telephone Encounter (Signed)
Last filled 04/21/2016--please advise

## 2016-08-05 NOTE — Progress Notes (Signed)
Subjective:    Patient ID: Kevin Sheppard., male    DOB: July 07, 1951, 65 y.o.   MRN: [id number]  HPI  Pt presents to the clinic today to have staples removed. He has 5 staples placed in his forehead s/p fall on 11/10. He reports he has not had any complications. He denies drainage, redness or warmth around the laceration. He denies headaches, blurred vision or dizziness.  Review of Systems      Past Medical History:  Diagnosis Date  . Abnormal drug screen 8/2-05/2015   +MJ pt states one time thing (04/2015)  . Diabetes type 2, controlled ([place])   . History of chicken pox   . History of colon polyps    benign  . HLD (hyperlipidemia)   . Hypothyroid   . Peripheral neuropathy ([place]) 12/07/2013   Thyroid related neuropathy  . 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 ibuprofen (ADVIL,MOTRIN) 800 MG tablet TAKE 1 TABLET (800 MG TOTAL) BY MOUTH 2 (TWO) TIMES DAILY AS NEEDED. REPORTED ON 10/23/2015 40 tablet 0  . levothyroxine (SYNTHROID) 100 MCG tablet Take 1 tablet (100 mcg total) by mouth daily before breakfast. 90 tablet 3  . Melatonin 3 MG CAPS Take 1 capsule (3 mg total) by mouth at bedtime as needed (sleep).  0  . metFORMIN (GLUCOPHAGE) 500 MG tablet Take 1 tablet (500 mg total) by mouth 2 (two) times daily with a meal. First week take one daily 180 tablet 3  . Multiple Vitamins-Minerals (MULTIVITAMIN ADULTS 50+ PO) Take 1 tablet by mouth daily.    . Omega-3 Fatty Acids (FISH OIL) 1000 MG CAPS Take 1 capsule by mouth daily.    . sildenafil (VIAGRA) 100 MG tablet Take 0.5-1 tablets (50-100 mg total) by mouth daily as needed for erectile dysfunction. 6 tablet 6  . sitaGLIPtin (JANUVIA) 50 MG tablet Take 1 tablet (50 mg total) by mouth daily. 90 tablet 3  . zolpidem (AMBIEN) 5 MG tablet TAKE 1 TABLET BY MOUTH AT BEDTIME AS NEEDED 30 tablet 0   No current facility-administered medications for this  visit.     Allergies  Allergen Reactions  . Lyrica [Pregabalin] Rash    Family History  Problem Relation Age of Onset  . Stroke Father   . Diabetes Father   . Other Mother     blood disease, died 66 (?porphyria)  . Diabetes type II Brother   . Diabetes type II Maternal Aunt   . Hypertension Sister   . Stroke Sister 77  . Thyroid disease Sister   . Cancer Neg Hx   . CAD Neg Hx     Social History   Social History  . Marital status: Married    Spouse name: N/A  . Number of children: 4  . Years of education: N/A   Occupational History  . line adjuster    Social History Main Topics  . Smoking status: Never Smoker  . Smokeless tobacco: Never Used  . Alcohol use 0.0 oz/week     Comment: 12pack every weekend.   . Drug use: No  . Sexual activity: Not on file   Other Topics Concern  . Not on file   Social History Narrative   He lives with wife.  2 dogs. They have grown 4 children and 3 grandchildren.    He works on cigarette machines, 3rd shift   Activity: goes to gym regularly  Diet: good water, fruits/vegetables daily     Constitutional: Denies fever, malaise, fatigue, headache or abrupt weight changes.  Skin: Pt reports laceration of scalp. Denies redness, rashes, lesions or ulcercations.    No other specific complaints in a complete review of systems (except as listed in HPI above).  Objective:   Physical Exam   BP 106/74   Pulse 60   Temp 98.7 F (37.1 C) (Oral)   Wt 171 lb (77.6 kg)   SpO2 98%   BMI 28.46 kg/m  Wt Readings from Last 3 Encounters:  08/05/16 171 lb (77.6 kg)  07/25/16 165 lb (74.8 kg)  05/06/16 165 lb 12.8 oz (75.2 kg)    General: Appears his stated age, well developed, well nourished in NAD. Skin: Warm, dry and intact. Laceration to right scalp, intact, scabbed over.  BMET    Component Value Date/Time   NA 139 04/29/2016 0816   NA 146 06/06/2014   K 4.0 04/29/2016 0816   K 4.6 06/06/2014   CL 106 04/29/2016 0816    CO2 24 04/29/2016 0816   GLUCOSE 125 (H) 04/29/2016 0816   BUN 15 04/29/2016 0816   BUN 16 06/06/2014   CREATININE 0.69 04/29/2016 0816   CREATININE 0.85 06/06/2014   CALCIUM 9.4 04/29/2016 0816   CALCIUM 9.7 06/06/2014    Lipid Panel     Component Value Date/Time   CHOL 180 04/29/2016 0816   CHOL 240 06/06/2014   TRIG 93.0 04/29/2016 0816   TRIG 108 06/06/2014   HDL 55.50 04/29/2016 0816   CHOLHDL 3 04/29/2016 0816   VLDL 18.6 04/29/2016 0816   LDLCALC 106 (H) 04/29/2016 0816   LDLCALC 166 06/06/2014    CBC    Component Value Date/Time   WBC 5.2 06/06/2014   HGB 16.0 06/06/2014    Hgb A1C Lab Results  Component Value Date   HGBA1C 6.1 04/29/2016           Assessment & Plan:   Encounter for stapel removal:  Staples removed by provider Aftercare instructions given  Return precautions discussed. Webb Silversmith, NP

## 2016-08-05 NOTE — Patient Instructions (Signed)
Wound Closure Removal  The staples, stitches, or skin adhesives that were used to close your skin have been removed. You will need to continue the care described here until the wound is completely healed and your health care provider confirms that wound care can be stopped.  How do I care for my wound?  How you care for your wound after the wound closure has been removed depends on the kind of wound closure you had.  Stitches or Staples  · Keep the wound site dry and clean. Do not soak it in water.  · If skin adhesive strips were applied after the staples were removed, they will begin to peel off in a few days. Allow them to remain in place until they fall off on their own.  · If you still have a bandage (dressing), change it at least once a day or as directed by your health care provider. If the dressing sticks, pour warm, sterile water over it until it loosens and can be removed without pulling apart the wound edges. Pat the area dry with a soft, clean towel. Do not rub the wound because that may cause bleeding.  · Apply cream or ointment that stops the growth of bacteria (antibacterial cream or antibacterial ointment) only if your health care provider has directed you to do so.  · Place a nonstick bandage over the wound to prevent the dressing from sticking.  · Cover the nonstick bandage with a new dressing as directed by your health care provider.  · If the bandage becomes wet or dirty or it develops a bad smell, change it as soon as possible.  · Take medicines only as directed by your health care provider.    Adhesive Strips or Glue  · Adhesive strips and glue peel off on their own.  · Leave adhesive strips and glue in place until they fall off.    Are there any bathing restrictions once my wound closure is removed?  Do not take baths, swim, or use a hot tub until your health care provider approves.  How can I decrease the size of my scar?  How your scar heals and the size of your scar depend on many factors,  such as your age, the type of scar you have, and genetic factors. The following may help decrease the size of your scar:  · Sunscreen. Use sunscreen with a sun protection factor (SPF) of at least 15 when out in the sun. Reapply the sunscreen every two hours.  · Friction massage. Once your wound is completely healed, you can gently massage the scarred area. This can decrease scar thickness.    When should I seek help?  Seek help if:  · You have a fever.  · You have chills.  · You have drainage, redness, swelling, or pain at your wound.  · There is a bad smell coming from your wound.  · Your wound edges open up or do not stay closed after the wound closure has been removed.    This information is not intended to replace advice given to you by your health care provider. Make sure you discuss any questions you have with your health care provider.  Document Released: 08/14/2008 Document Revised: 02/13/2016 Document Reviewed: 01/17/2014  Elsevier Interactive Patient Education © 2017 Elsevier Inc.

## 2016-08-06 MED ORDER — ZOLPIDEM TARTRATE 5 MG PO TABS: 5.0000 mg | ORAL_TABLET | Freq: Every evening | ORAL | 0 refills | Status: DC | PRN

## 2016-08-06 NOTE — Telephone Encounter (Signed)
Plz phone in

## 2016-08-06 NOTE — Telephone Encounter (Signed)
Rx called in as directed.   

## 2016-08-27 ENCOUNTER — Other Ambulatory Visit: Payer: Self-pay | Admitting: Family Medicine

## 2016-08-28 NOTE — Telephone Encounter (Signed)
Ok to refill? Last filled 07/15/16 #40 0RF

## 2016-10-16 LAB — HM DIABETES EYE EXAM

## 2016-10-29 ENCOUNTER — Telehealth: Payer: Self-pay

## 2016-10-29 DIAGNOSIS — N529 Male erectile dysfunction, unspecified: Secondary | ICD-10-CM

## 2016-10-29 LAB — HM DIABETES EYE EXAM

## 2016-10-29 NOTE — Telephone Encounter (Addendum)
Pt is requesting urology referral in [place] due to ED. Pt has had a problem for sometime; pt has discussed problem with Dr Darnell Level and Viagra not helping a lot. Last annual 05/06/16.pt request urologist to be in pts network for ins.

## 2016-10-29 NOTE — Telephone Encounter (Signed)
Referral placed.  Failed viagra, cialis.

## 2016-10-31 NOTE — Telephone Encounter (Signed)
Have left several messages for patient regarding Urology consult. Patient has an appt with you this Monday, 11/03/16 please send patient out to see [place] for info about his Urology consult.

## 2016-11-03 ENCOUNTER — Encounter: Payer: Self-pay | Admitting: Family Medicine

## 2016-11-03 ENCOUNTER — Ambulatory Visit (INDEPENDENT_AMBULATORY_CARE_PROVIDER_SITE_OTHER): Payer: Managed Care, Other (non HMO) | Admitting: Family Medicine

## 2016-11-03 VITALS — BP 108/70 | HR 55 | Temp 98.3°F | Wt 180.0 lb

## 2016-11-03 DIAGNOSIS — N529 Male erectile dysfunction, unspecified: Secondary | ICD-10-CM

## 2016-11-03 DIAGNOSIS — E039 Hypothyroidism, unspecified: Secondary | ICD-10-CM

## 2016-11-03 DIAGNOSIS — G4726 Circadian rhythm sleep disorder, shift work type: Secondary | ICD-10-CM | POA: Diagnosis not present

## 2016-11-03 DIAGNOSIS — E119 Type 2 diabetes mellitus without complications: Secondary | ICD-10-CM

## 2016-11-03 LAB — HEMOGLOBIN A1C: Hgb A1c MFr Bld: 6.6 % — ABNORMAL HIGH (ref 4.6–6.5)

## 2016-11-03 LAB — TSH: TSH: 1.15 u[IU]/mL (ref 0.35–4.50)

## 2016-11-03 MED ORDER — SILDENAFIL CITRATE 20 MG PO TABS: 40.0000 mg | ORAL_TABLET | Freq: Every day | ORAL | 0 refills | Status: DC | PRN

## 2016-11-03 NOTE — Assessment & Plan Note (Signed)
Now off ambien. Continue melatonin.

## 2016-11-03 NOTE — Addendum Note (Signed)
Addended by: Agnes Lawrence on: 11/03/2016 10:01 AM   Modules accepted: Orders

## 2016-11-03 NOTE — Assessment & Plan Note (Signed)
Update labs.  

## 2016-11-03 NOTE — Assessment & Plan Note (Addendum)
Chronic, pt stopped medications. Will update A1c and results will determine treatment plan. Discussed diet recommendations to control sugars.

## 2016-11-03 NOTE — Patient Instructions (Addendum)
Labs today.  See Rosaria Ferries on your way out.  Try generic viagra (sidenafil).  Use lotrimin between toes to help up skin maceration.  Good to see you today.

## 2016-11-03 NOTE — Progress Notes (Signed)
BP 108/70 (BP Location: Left Arm, Patient Position: Sitting, Cuff Size: Normal)   Pulse (!) 55   Temp 98.3 F (36.8 C) (Oral)   Wt 180 lb (81.6 kg) Comment: with shoes  BMI 29.95 kg/m    CC: 6 mo f/u visit Subjective:    Patient ID: Kevin Broas., male    DOB: 11/04/1950, 66 y.o.   MRN: [id number]  HPI: Kevin Kusner. is a 66 y.o. male presenting on 11/03/2016 for Follow-up   Fall late last year with 5 staples to skull apex. Healed well from this.   Requests urology referral for unchanged ED. Failed viagra, cialis.   DM - regularly does not check sugars. Stopped metformin "heard bad things about it", not taking [place] "thought either one or the other". Previously well controlled. Denies low sugars or hypoglycemic symptoms.  Denies paresthesias. Last diabetic eye exam 10/2016. Pneumovax: not due yet. Prevnar: 05/2014. Lab Results  Component Value Date   HGBA1C 6.1 04/29/2016   Diabetic Foot Exam - Simple   Simple Foot Form Diabetic Foot exam was performed with the following findings:  Yes 11/03/2016  8:42 AM  Visual Inspection See comments:  Yes Sensation Testing Intact to touch and monofilament testing bilaterally:  Yes Pulse Check Posterior Tibialis and Dorsalis pulse intact bilaterally:  Yes Comments Skin maceration between lateral toes bilaterally L sole with callus with central core      Hypothyroidism - compliant with levothyroxine 156mcg daily.   On ambien CR 12.5mg  daily for 8+ yrs due to shift work sleep disorder. See prior notes for details. Last visit we transitioned to [place] IR 5mg  daily. States this dose was ineffective. He is currently not taking this.   Relevant past medical, surgical, family and social history reviewed and updated as indicated. Interim medical history since our last visit reviewed. Allergies and medications reviewed and updated. Outpatient Medications Prior to Visit  Medication Sig Dispense Refill  . ibuprofen (ADVIL,MOTRIN) 800 MG  tablet TAKE 1 TABLET (800 MG TOTAL) BY MOUTH 2 (TWO) TIMES DAILY AS NEEDED. REPORTED ON 10/23/2015 40 tablet 0  . levothyroxine (SYNTHROID) 100 MCG tablet Take 1 tablet (100 mcg total) by mouth daily before breakfast. 90 tablet 3  . Melatonin 3 MG CAPS Take 1 capsule (3 mg total) by mouth at bedtime as needed (sleep).  0  . Multiple Vitamins-Minerals (MULTIVITAMIN ADULTS 50+ PO) Take 1 tablet by mouth daily.    . Omega-3 Fatty Acids (FISH OIL) 1000 MG CAPS Take 1 capsule by mouth daily.    . sildenafil (VIAGRA) 100 MG tablet Take 0.5-1 tablets (50-100 mg total) by mouth daily as needed for erectile dysfunction. 6 tablet 6  . metFORMIN (GLUCOPHAGE) 500 MG tablet Take 1 tablet (500 mg total) by mouth 2 (two) times daily with a meal. First week take one daily (Patient not taking: Reported on 11/03/2016) 180 tablet 3  . sitaGLIPtin (JANUVIA) 50 MG tablet Take 1 tablet (50 mg total) by mouth daily. (Patient not taking: Reported on 11/03/2016) 90 tablet 3  . zolpidem (AMBIEN) 5 MG tablet Take 1 tablet (5 mg total) by mouth at bedtime as needed. (Patient not taking: Reported on 11/03/2016) 30 tablet 0   No facility-administered medications prior to visit.      Per HPI unless specifically indicated in ROS section below Review of Systems     Objective:    BP 108/70 (BP Location: Left Arm, Patient Position: Sitting, Cuff Size: Normal)   Pulse (!) 55  Temp 98.3 F (36.8 C) (Oral)   Wt 180 lb (81.6 kg) Comment: with shoes  BMI 29.95 kg/m   Wt Readings from Last 3 Encounters:  11/03/16 180 lb (81.6 kg)  08/05/16 171 lb (77.6 kg)  07/25/16 165 lb (74.8 kg)    Physical Exam  Constitutional: He appears well-developed and well-nourished. No distress.  HENT:  Head: Normocephalic and atraumatic.  Right Ear: External ear normal.  Left Ear: External ear normal.  Nose: Nose normal.  Mouth/Throat: Oropharynx is clear and moist. No oropharyngeal exudate.  Eyes: Conjunctivae and EOM are normal. Pupils are  equal, round, and reactive to light. No scleral icterus.  Neck: Normal range of motion. Neck supple. No thyromegaly present.  Cardiovascular: Normal rate, regular rhythm, normal heart sounds and intact distal pulses.   No murmur heard. Pulmonary/Chest: Effort normal and breath sounds normal. No respiratory distress. He has no wheezes. He has no rales.  Musculoskeletal: He exhibits no edema.  See HPI for foot exam if done  Lymphadenopathy:    He has no cervical adenopathy.  Skin: Skin is warm and dry. No rash noted.  Psychiatric: He has a normal mood and affect.  Nursing note and vitals reviewed.  Results for orders placed or performed in visit on 11/03/16  HM DIABETES EYE EXAM  Result Value Ref Range   HM Diabetic Eye Exam No Retinopathy No Retinopathy      Assessment & Plan:   Problem List Items Addressed This Visit    Diabetes type 2, controlled ([place]) - Primary    Chronic, pt stopped medications. Will update A1c and results will determine treatment plan. Discussed diet recommendations to control sugars.       Relevant Orders   Hemoglobin A1c   Erectile dysfunction    Trial generic sildenafil.  Refer to urology per pt request to discuss other options.      Hypothyroidism    Update labs      Relevant Orders   TSH   Shift work sleep disorder    Now off ambien. Continue melatonin.          Follow up plan: Return in about 6 months (around 05/03/2017) for annual exam, prior fasting for blood work.  Ria Bush, MD

## 2016-11-03 NOTE — Assessment & Plan Note (Signed)
Trial generic sildenafil.  Refer to urology per pt request to discuss other options.

## 2016-11-06 ENCOUNTER — Telehealth: Payer: Self-pay | Admitting: *Deleted

## 2016-11-06 ENCOUNTER — Other Ambulatory Visit: Payer: Self-pay | Admitting: Family Medicine

## 2016-11-06 MED ORDER — METFORMIN HCL 500 MG PO TABS: 500.0000 mg | ORAL_TABLET | Freq: Every day | ORAL | 3 refills | Status: DC

## 2016-11-06 NOTE — Telephone Encounter (Signed)
Sildenafil required PA. Completed on Cover My Meds. Awaiting determination.

## 2016-11-13 ENCOUNTER — Ambulatory Visit (INDEPENDENT_AMBULATORY_CARE_PROVIDER_SITE_OTHER): Payer: Managed Care, Other (non HMO) | Admitting: Podiatry

## 2016-11-13 ENCOUNTER — Encounter: Payer: Self-pay | Admitting: Podiatry

## 2016-11-13 DIAGNOSIS — Q828 Other specified congenital malformations of skin: Secondary | ICD-10-CM

## 2016-11-13 DIAGNOSIS — M79672 Pain in left foot: Secondary | ICD-10-CM

## 2016-11-14 NOTE — Progress Notes (Signed)
Subjective:     Patient ID: Kevin Sheppard., male   DOB: 11-14-1950, 66 y.o.   MRN: [id number]  HPI 66 year old male presents the office today for concerns of a painful lesion to the left ball of the foot which is been ongoing for about 2-3 weeks. He states it feels that he is walking on a rock. He is tried over-the-counter corn removal without any relief. He denies stepping on any foreign objects. Denies any swelling or redness or any drainage from the area. Areas painful with pressure and weightbearing. No other complaints today no other treatment.  Review of Systems  All other systems reviewed and are negative.      Objective:   Physical Exam General: AAO x3, NAD  Dermatological: On the left foot some metatarsal threes an annular hyperkeratotic lesion. Upon debridement there is no evidence of form body, puncture wound. There is no pinpoint bleeding or evidence of verruca. There is appears to be a deep porokeratosis. There is no other open lesions or pre-ulcer lesions identified at this time.   Vascular: DP/PT pulses 2/4, CRT less than 3 seconds. There is no pain with calf compression, swelling, warmth, erythema.   Neruologic: Grossly intact via light touch bilateral. Vibratory intact via tuning fork bilateral. Protective threshold with Semmes Wienstein monofilament intact to all pedal sites bilateral.  Musculoskeletal:  tenderness palpation submetatarsal 3 for keratotic lesion. No gross boney pedal deformities bilateral. No pain, crepitus, or limitation noted with foot and ankle range of motion bilateral. Muscular strength 5/5 in all groups tested bilateral.  Gait: Unassisted, Nonantalgic.      Assessment:     Left foot porokeratosis submetatarsal 3     Plan:     -Treatment options discussed including all alternatives, risks, and complications -Etiology of symptoms were discussed -Lesion sharply debrided without complications or bleeding. Area was cleaned and a pad was placed  followed by salicylic acid and a bandage. Post procedure instructions were discussed. Monitor for infection.  -RTC in 4 weeks or sooner if needed.   *If symptoms continue x-ray next appointment with a skin marker over the lesion.   Celesta Gentile, DPM

## 2016-11-18 ENCOUNTER — Ambulatory Visit: Payer: Managed Care, Other (non HMO) | Admitting: Urology

## 2016-11-18 ENCOUNTER — Encounter: Payer: Self-pay | Admitting: Urology

## 2016-11-18 VITALS — BP 128/77 | HR 69 | Ht 65.0 in | Wt 176.1 lb

## 2016-11-18 DIAGNOSIS — N5201 Erectile dysfunction due to arterial insufficiency: Secondary | ICD-10-CM | POA: Diagnosis not present

## 2016-11-18 MED ORDER — SILDENAFIL CITRATE 20 MG PO TABS: 40.0000 mg | ORAL_TABLET | Freq: Every day | ORAL | 5 refills | Status: DC | PRN

## 2016-11-18 NOTE — Progress Notes (Signed)
.   11/18/2016 3:35 PM   Kevin Sheppard. 07/30/1951 [id number]  Referring provider: Ria Bush, MD [id number] [place], [place] [address]  Chief Complaint  Patient presents with  . New Patient (Initial Visit)    erectile dysfunction     HPI: Consultation for erectile dysfunction. His SHIM is 12 (mild-moderate ED). He tried Cialis and Viagra without a lot of results. Associated with DM, peripheral neuropathy and hypothyroid. His PSA was 0.57 Aug 2017. He has a left UDT, right testicle normal. Good libido. He has four kids.    PMH: Past Medical History:  Diagnosis Date  . Abnormal drug screen 8/2-05/2015   +MJ pt states one time thing (04/2015)  . Diabetes type 2, controlled ([place])   . History of chicken pox   . History of colon polyps    benign  . HLD (hyperlipidemia)   . Hypothyroid   . 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 COLONOSCOPY  2004;2009   first with b9 polyps, then diverticulosis no polyps, rpt 22yrs ([facility])  . MENISCUS REPAIR Bilateral 2002;2008    Home Medications:  Allergies as of 11/18/2016      Reactions   Lyrica [pregabalin] Rash      Medication List       Accurate as of 11/18/16  3:35 PM. Always use your most recent med list.          Fish Oil 1000 MG Caps Take 1 capsule by mouth daily.   ibuprofen 800 MG tablet Commonly known as:  ADVIL,MOTRIN TAKE 1 TABLET (800 MG TOTAL) BY MOUTH 2 (TWO) TIMES DAILY AS NEEDED. REPORTED ON 10/23/2015   levothyroxine 100 MCG tablet Commonly known as:  SYNTHROID Take 1 tablet (100 mcg total) by mouth daily before breakfast.   metFORMIN 500 MG tablet Commonly known as:  GLUCOPHAGE Take 1 tablet (500 mg total) by mouth daily with breakfast.   MULTIVITAMIN ADULTS 50+ PO Take 1 tablet by mouth daily.   sildenafil 20 MG  tablet Commonly known as:  REVATIO Take 2-5 tablets (40-100 mg total) by mouth daily as needed (relations).       Allergies:  Allergies  Allergen Reactions  . Lyrica [Pregabalin] Rash    Family History: Family History  Problem Relation Age of Onset  . Stroke Father   . Diabetes Father   . Other Mother     blood disease, died 16 (?porphyria)  . Diabetes type II Brother   . Diabetes type II Maternal Aunt   . Hypertension Sister   . Stroke Sister 36  . Thyroid disease Sister   . Cancer Neg Hx   . CAD Neg Hx     Social History:  reports that he has never smoked. He has never used smokeless tobacco. He reports that he drinks alcohol. He reports that he does not use drugs.  ROS: UROLOGY Frequent Urination?: No Hard to postpone urination?: No Burning/pain with urination?: No Get up at night to urinate?: No Leakage of urine?: No Urine stream starts and stops?: No Trouble starting stream?: No Do you have to strain to urinate?: No Blood in urine?: No Urinary tract infection?: No Sexually transmitted disease?: No Injury to kidneys or bladder?: No Painful intercourse?: No Weak stream?: No Erection problems?: Yes Penile pain?: No  Gastrointestinal Nausea?: No Vomiting?: No Indigestion/heartburn?:  No Diarrhea?: No Constipation?: No  Constitutional Fever: No Night sweats?: No Weight loss?: No Fatigue?: No  Skin Skin rash/lesions?: No Itching?: No  Eyes Blurred vision?: No Double vision?: No  Ears/Nose/Throat Sore throat?: No Sinus problems?: No  Hematologic/Lymphatic Swollen glands?: No Easy bruising?: No  Cardiovascular Leg swelling?: No Chest pain?: No  Respiratory Cough?: No Shortness of breath?: No  Endocrine Excessive thirst?: No  Musculoskeletal Back pain?: No Joint pain?: No  Neurological Headaches?: No Dizziness?: No  Psychologic Depression?: No Anxiety?: No  Physical Exam: BP 128/77   Pulse 69   Ht 5\' 5"  (1.651 m)    Wt 79.9 kg (176 lb 1.6 oz)   BMI 29.30 kg/m   Constitutional:  Alert and oriented, No acute distress. HEENT: [place] AT, moist mucus membranes.  Trachea midline, no masses. Cardiovascular: No clubbing, cyanosis, or edema. Respiratory: Normal respiratory effort, no increased work of breathing. GI: Abdomen is soft, nontender, nondistended, no abdominal masses GU: No CVA tenderness.  Skin: No rashes, bruises or suspicious lesions. Lymph: No cervical or inguinal adenopathy. Neurologic: Grossly intact, no focal deficits, moving all 4 extremities. Psychiatric: Normal mood and affect. GU: penis uncircumcised, normal foreskin, left testicle absent, right testicle descended and palpably normal  Laboratory Data: Lab Results  Component Value Date   WBC 5.2 06/06/2014   HGB 16.0 06/06/2014    Lab Results  Component Value Date   CREATININE 0.69 04/29/2016    Lab Results  Component Value Date   PSA 0.57 04/29/2016   PSA 0.40 03/05/2015   PSA 0.6 06/06/2014    No results found for: TESTOSTERONE  Lab Results  Component Value Date   HGBA1C 6.6 (H) 11/03/2016    Urinalysis No results found for: COLORURINE, APPEARANCEUR, LABSPEC, [place], GLUCOSEU, HGBUR, BILIRUBINUR, KETONESUR, PROTEINUR, UROBILINOGEN, NITRITE, LEUKOCYTESUR  Pertinent Imaging:   Assessment & Plan:    ED - pt with normal exam. Discussed nature r/b/a to VED, injections, Muse, PDE5i. All questions answered. He'll start injections. Discussed priapism and plan to deal with it. Discussed risk of scarring and curvature.   There are no diagnoses linked to this encounter.  No Follow-up on file.  Festus Aloe, [facility] [address], [place], [place] [address] [phone]

## 2016-12-16 ENCOUNTER — Ambulatory Visit: Payer: Managed Care, Other (non HMO) | Admitting: Podiatry

## 2016-12-22 ENCOUNTER — Other Ambulatory Visit: Payer: Self-pay

## 2016-12-22 NOTE — Telephone Encounter (Signed)
Pt left v/m; pt has been exercising and knees are bothering pt and request refill ibuprofen 800 mg to [facility]. Pt last seen 11/03/16 and last refilled # 40 on 08/28/16.

## 2016-12-23 MED ORDER — IBUPROFEN 800 MG PO TABS: 800.0000 mg | ORAL_TABLET | Freq: Two times a day (BID) | ORAL | 1 refills | Status: DC | PRN

## 2017-02-02 ENCOUNTER — Ambulatory Visit (INDEPENDENT_AMBULATORY_CARE_PROVIDER_SITE_OTHER): Payer: Managed Care, Other (non HMO) | Admitting: Nurse Practitioner

## 2017-02-02 ENCOUNTER — Encounter: Payer: Self-pay | Admitting: Nurse Practitioner

## 2017-02-02 VITALS — BP 118/84 | HR 69 | Temp 97.7°F | Ht 65.0 in | Wt 173.0 lb

## 2017-02-02 DIAGNOSIS — J069 Acute upper respiratory infection, unspecified: Secondary | ICD-10-CM | POA: Diagnosis not present

## 2017-02-02 MED ORDER — METHYLPREDNISOLONE ACETATE 40 MG/ML IJ SUSP
40.0000 mg | Freq: Once | INTRAMUSCULAR | Status: AC
  Administered 2017-02-02: 40 mg via INTRAMUSCULAR

## 2017-02-02 MED ORDER — DM-GUAIFENESIN ER 30-600 MG PO TB12: 1.0000 | ORAL_TABLET | Freq: Two times a day (BID) | ORAL | 0 refills | Status: DC | PRN

## 2017-02-02 MED ORDER — IPRATROPIUM BROMIDE 0.03 % NA SOLN: 2.0000 | Freq: Two times a day (BID) | NASAL | 0 refills | Status: DC

## 2017-02-02 MED ORDER — SALINE SPRAY 0.65 % NA SOLN: 1.0000 | NASAL | 0 refills | Status: DC | PRN

## 2017-02-02 NOTE — Progress Notes (Signed)
Subjective:  Patient ID: Kevin Sheppard., male    DOB: 30-Jun-1951  Age: 66 y.o. MRN: [id number]  CC: Sore Throat (sore throat,running nose, 1 day--took mucinex. )   Sore Throat   This is a new problem. The current episode started yesterday. The problem has been unchanged. There has been no fever. Associated symptoms include congestion and coughing. Pertinent negatives include no abdominal pain, diarrhea, drooling, ear discharge, ear pain, headaches, hoarse voice, plugged ear sensation, neck pain, shortness of breath, stridor, swollen glands, trouble swallowing or vomiting. He has had no exposure to strep or mono. He has tried oral narcotic analgesics for the symptoms. The treatment provided no relief.    Outpatient Medications Prior to Visit  Medication Sig Dispense Refill  . ibuprofen (ADVIL,MOTRIN) 800 MG tablet Take 1 tablet (800 mg total) by mouth 2 (two) times daily as needed. Reported on 10/23/2015 40 tablet 1  . levothyroxine (SYNTHROID) 100 MCG tablet Take 1 tablet (100 mcg total) by mouth daily before breakfast. 90 tablet 3  . metFORMIN (GLUCOPHAGE) 500 MG tablet Take 1 tablet (500 mg total) by mouth daily with breakfast. 90 tablet 3  . Multiple Vitamins-Minerals (MULTIVITAMIN ADULTS 50+ PO) Take 1 tablet by mouth daily.    . Omega-3 Fatty Acids (FISH OIL) 1000 MG CAPS Take 1 capsule by mouth daily.    . sildenafil (REVATIO) 20 MG tablet Take 2-5 tablets (40-100 mg total) by mouth daily as needed (relations). 30 tablet 5   No facility-administered medications prior to visit.     ROS See HPI  Objective:  BP 118/84   Pulse 69   Temp 97.7 F (36.5 C)   Ht 5\' 5"  (1.651 m)   Wt 173 lb (78.5 kg)   SpO2 99%   BMI 28.79 kg/m   BP Readings from Last 3 Encounters:  02/02/17 118/84  11/18/16 128/77  11/03/16 108/70    Wt Readings from Last 3 Encounters:  02/02/17 173 lb (78.5 kg)  11/18/16 176 lb 1.6 oz (79.9 kg)  11/03/16 180 lb (81.6 kg)    Physical Exam    Constitutional: He is oriented to person, place, and time. No distress.  HENT:  Right Ear: Tympanic membrane, external ear and ear canal normal.  Left Ear: Tympanic membrane and ear canal normal.  Nose: Mucosal edema and rhinorrhea present. Right sinus exhibits no maxillary sinus tenderness and no frontal sinus tenderness. Left sinus exhibits no maxillary sinus tenderness and no frontal sinus tenderness.  Mouth/Throat: Uvula is midline. Posterior oropharyngeal erythema present. No oropharyngeal exudate.  Eyes: No scleral icterus.  Neck: Normal range of motion. Neck supple.  Cardiovascular: Normal rate and regular rhythm.   Pulmonary/Chest: Effort normal and breath sounds normal.  Lymphadenopathy:    He has no cervical adenopathy.  Neurological: He is alert and oriented to person, place, and time.  Vitals reviewed.   Lab Results  Component Value Date   WBC 5.2 06/06/2014   HGB 16.0 06/06/2014   GLUCOSE 125 (H) 04/29/2016   CHOL 180 04/29/2016   TRIG 93.0 04/29/2016   HDL 55.50 04/29/2016   LDLCALC 106 (H) 04/29/2016   ALT 22 03/05/2015   AST 24 03/05/2015   NA 139 04/29/2016   K 4.0 04/29/2016   CL 106 04/29/2016   CREATININE 0.69 04/29/2016   BUN 15 04/29/2016   CO2 24 04/29/2016   TSH 1.15 11/03/2016   PSA 0.57 04/29/2016   HGBA1C 6.6 (H) 11/03/2016   MICROALBUR 0.7 04/29/2016  No results found.  Assessment & Plan:   Kevin Sheppard was seen today for sore throat.  Diagnoses and all orders for this visit:  URI, acute -     ipratropium (ATROVENT) 0.03 % nasal spray; Place 2 sprays into both nostrils 2 (two) times daily. Do not use for more than 5days. -     dextromethorphan-guaiFENesin (MUCINEX DM) 30-600 MG 12hr tablet; Take 1 tablet by mouth 2 (two) times daily as needed for cough. -     methylPREDNISolone acetate (DEPO-MEDROL) injection 40 mg; Inject 1 mL (40 mg total) into the muscle once. -     sodium chloride (OCEAN) 0.65 % SOLN nasal spray; Place 1 spray into both  nostrils as needed for congestion.   I am having Kevin Sheppard start on ipratropium, dextromethorphan-guaiFENesin, and sodium chloride. I am also having him maintain his Fish Oil, Multiple Vitamins-Minerals (MULTIVITAMIN ADULTS 50+ PO), levothyroxine, metFORMIN, sildenafil, and ibuprofen. We will continue to administer methylPREDNISolone acetate.  Meds ordered this encounter  Medications  . ipratropium (ATROVENT) 0.03 % nasal spray    Sig: Place 2 sprays into both nostrils 2 (two) times daily. Do not use for more than 5days.    Dispense:  30 mL    Refill:  0    Order Specific Question:   Supervising Provider    Answer:   Cassandria Anger [[id number]]  . dextromethorphan-guaiFENesin (MUCINEX DM) 30-600 MG 12hr tablet    Sig: Take 1 tablet by mouth 2 (two) times daily as needed for cough.    Dispense:  14 tablet    Refill:  0    Order Specific Question:   Supervising Provider    Answer:   Cassandria Anger [[id number]]  . methylPREDNISolone acetate (DEPO-MEDROL) injection 40 mg  . sodium chloride (OCEAN) 0.65 % SOLN nasal spray    Sig: Place 1 spray into both nostrils as needed for congestion.    Dispense:  15 mL    Refill:  0    Order Specific Question:   Supervising Provider    Answer:   Cassandria Anger [[id number]]    Follow-up: Return if symptoms worsen or fail to improve.  Wilfred Lacy, NP

## 2017-02-02 NOTE — Patient Instructions (Addendum)
URI Instructions: Encourage adequate oral hydration.  Use over-the-counter  "cold" medicines  such as "Tylenol cold" , "Advil cold",  "Mucinex" or" Mucinex D"  for cough and congestion.  Avoid decongestants if you have high blood pressure. Use" Delsym" or" Robitussin" cough syrup varietis for cough.  You can use plain "Tylenol" or "Advi"l for fever, chills and achyness.   "Common cold" symptoms are usually triggered by a virus.  The antibiotics are usually not necessary. On average, a" viral cold" illness would take 4-7 days to resolve. Please, make an appointment if you are not better or if you're worse.  Call office for oral abx prescription if no improvement in 4-5days.

## 2017-02-06 ENCOUNTER — Telehealth: Payer: Self-pay | Admitting: Nurse Practitioner

## 2017-02-06 DIAGNOSIS — J029 Acute pharyngitis, unspecified: Secondary | ICD-10-CM

## 2017-02-06 MED ORDER — AMOXICILLIN 875 MG PO TABS: 875.0000 mg | ORAL_TABLET | Freq: Two times a day (BID) | ORAL | 0 refills | Status: DC

## 2017-02-06 NOTE — Telephone Encounter (Signed)
Please help call pt and  let pt know that antibiotic sent to [facility]

## 2017-02-06 NOTE — Telephone Encounter (Signed)
Pt states he was seen by Baldo Ash and he was advised to wait a couple days and if his throat does feel any better he could get an antibiotic. He would like that called in .     Kristopher Oppenheim in [place]

## 2017-03-01 ENCOUNTER — Other Ambulatory Visit: Payer: Self-pay | Admitting: Family Medicine

## 2017-03-03 ENCOUNTER — Other Ambulatory Visit: Payer: Self-pay | Admitting: Family Medicine

## 2017-03-07 ENCOUNTER — Other Ambulatory Visit: Payer: Self-pay | Admitting: Family Medicine

## 2017-05-06 ENCOUNTER — Other Ambulatory Visit: Payer: Self-pay | Admitting: Family Medicine

## 2017-05-06 DIAGNOSIS — E039 Hypothyroidism, unspecified: Secondary | ICD-10-CM

## 2017-05-06 DIAGNOSIS — Z125 Encounter for screening for malignant neoplasm of prostate: Secondary | ICD-10-CM

## 2017-05-06 DIAGNOSIS — E119 Type 2 diabetes mellitus without complications: Secondary | ICD-10-CM

## 2017-05-06 DIAGNOSIS — E785 Hyperlipidemia, unspecified: Secondary | ICD-10-CM

## 2017-05-07 ENCOUNTER — Other Ambulatory Visit: Payer: Managed Care, Other (non HMO)

## 2017-05-12 ENCOUNTER — Encounter: Payer: Managed Care, Other (non HMO) | Admitting: Family Medicine

## 2017-05-27 ENCOUNTER — Other Ambulatory Visit (INDEPENDENT_AMBULATORY_CARE_PROVIDER_SITE_OTHER): Payer: Managed Care, Other (non HMO)

## 2017-05-27 DIAGNOSIS — E119 Type 2 diabetes mellitus without complications: Secondary | ICD-10-CM

## 2017-05-27 DIAGNOSIS — E785 Hyperlipidemia, unspecified: Secondary | ICD-10-CM | POA: Diagnosis not present

## 2017-05-27 DIAGNOSIS — E039 Hypothyroidism, unspecified: Secondary | ICD-10-CM

## 2017-05-27 DIAGNOSIS — Z125 Encounter for screening for malignant neoplasm of prostate: Secondary | ICD-10-CM

## 2017-05-28 LAB — LIPID PANEL
CHOL/HDL RATIO: 3
CHOLESTEROL: 202 mg/dL — AB (ref 0–200)
HDL: 62.9 mg/dL (ref >39.00)
LDL CALC: 118 mg/dL — AB (ref 0–99)
NonHDL: 139.02
Triglycerides: 103 mg/dL (ref 0.0–149.0)
VLDL: 20.6 mg/dL (ref 0.0–40.0)

## 2017-05-28 LAB — BASIC METABOLIC PANEL
BUN: 18 mg/dL (ref 6–23)
CO2: 24 mEq/L (ref 19–32)
Calcium: 9.7 mg/dL (ref 8.4–10.5)
Chloride: 105 mEq/L (ref 96–112)
Creatinine, Ser: 0.73 mg/dL (ref 0.40–1.50)
GFR: 114.61 mL/min (ref >60.00)
Glucose, Bld: 167 mg/dL — ABNORMAL HIGH (ref 70–99)
POTASSIUM: 3.9 meq/L (ref 3.5–5.1)
Sodium: 139 mEq/L (ref 135–145)

## 2017-05-28 LAB — TSH: TSH: 2.15 u[IU]/mL (ref 0.35–4.50)

## 2017-05-28 LAB — PSA: PSA: 0.43 ng/mL (ref 0.10–4.00)

## 2017-05-28 LAB — HEMOGLOBIN A1C: Hgb A1c MFr Bld: 7.5 % — ABNORMAL HIGH (ref 4.6–6.5)

## 2017-06-01 ENCOUNTER — Ambulatory Visit (INDEPENDENT_AMBULATORY_CARE_PROVIDER_SITE_OTHER): Payer: Managed Care, Other (non HMO) | Admitting: Family Medicine

## 2017-06-01 ENCOUNTER — Encounter: Payer: Self-pay | Admitting: Family Medicine

## 2017-06-01 VITALS — BP 112/64 | HR 53 | Temp 97.9°F | Ht 64.5 in | Wt 187.8 lb

## 2017-06-01 DIAGNOSIS — E119 Type 2 diabetes mellitus without complications: Secondary | ICD-10-CM

## 2017-06-01 DIAGNOSIS — E039 Hypothyroidism, unspecified: Secondary | ICD-10-CM

## 2017-06-01 DIAGNOSIS — E669 Obesity, unspecified: Secondary | ICD-10-CM | POA: Diagnosis not present

## 2017-06-01 DIAGNOSIS — Z0001 Encounter for general adult medical examination with abnormal findings: Secondary | ICD-10-CM

## 2017-06-01 DIAGNOSIS — Z1211 Encounter for screening for malignant neoplasm of colon: Secondary | ICD-10-CM

## 2017-06-01 DIAGNOSIS — G4726 Circadian rhythm sleep disorder, shift work type: Secondary | ICD-10-CM

## 2017-06-01 DIAGNOSIS — E663 Overweight: Secondary | ICD-10-CM | POA: Insufficient documentation

## 2017-06-01 DIAGNOSIS — E785 Hyperlipidemia, unspecified: Secondary | ICD-10-CM

## 2017-06-01 DIAGNOSIS — Z0000 Encounter for general adult medical examination without abnormal findings: Secondary | ICD-10-CM

## 2017-06-01 DIAGNOSIS — E66811 Obesity, class 1: Secondary | ICD-10-CM | POA: Insufficient documentation

## 2017-06-01 DIAGNOSIS — M25562 Pain in left knee: Secondary | ICD-10-CM | POA: Insufficient documentation

## 2017-06-01 DIAGNOSIS — Z6824 Body mass index (BMI) 24.0-24.9, adult: Secondary | ICD-10-CM | POA: Insufficient documentation

## 2017-06-01 DIAGNOSIS — G8929 Other chronic pain: Secondary | ICD-10-CM | POA: Insufficient documentation

## 2017-06-01 MED ORDER — ONETOUCH ULTRA MINI W/DEVICE KIT: PACK | 0 refills | Status: DC

## 2017-06-01 MED ORDER — GLUCOSE BLOOD VI STRP: ORAL_STRIP | 3 refills | Status: DC

## 2017-06-01 MED ORDER — LEVOTHYROXINE SODIUM 100 MCG PO TABS: 100.0000 ug | ORAL_TABLET | Freq: Every day | ORAL | 3 refills | Status: DC

## 2017-06-01 MED ORDER — METFORMIN HCL 500 MG PO TABS: 500.0000 mg | ORAL_TABLET | Freq: Two times a day (BID) | ORAL | 3 refills | Status: DC

## 2017-06-01 MED ORDER — IBUPROFEN 800 MG PO TABS: 800.0000 mg | ORAL_TABLET | Freq: Two times a day (BID) | ORAL | 3 refills | Status: DC | PRN

## 2017-06-01 NOTE — Progress Notes (Signed)
BP 112/64 (BP Location: Left Arm, Patient Position: Sitting, Cuff Size: Normal)   Pulse (!) 53   Temp 97.9 F (36.6 C) (Oral)   Ht 5' 4.5" (1.638 m)   Wt 187 lb 12 oz (85.2 kg)   SpO2 98%   BMI 31.73 kg/m    CC: CPE Subjective:    Patient ID: Kevin Sheppard., male    DOB: August 18, 1951, 66 y.o.   MRN: [id number]  HPI: Kevin Sheppard. is a 66 y.o. male presenting on 06/01/2017 for Annual Exam (6 month follow-up) and Knee Pain (C/o left knee pain. Slipped 05/31/17 and twisted knee)   Slipped on deck yesterday, twisted L knee. Requests ibuprofen refilled. Takes ibuprofen a few times a week.   Preventative: Colonoscopy - benign polyps 2004, no polyps 2009 rpt 5 yrs (Dr Allen Norris in [place]). Requests stool kit today.  Prostate cancer screening - discussed. Would like to continue.  Flu shot yearly at work Tdap 2011 Prevnar 05/2014. Pneumovax due.  shingrix - discussed  Seat belt use discussed Sunscreen use discussed. No changing moles.  Non smoker - wife smokes indoors. Works on [employer].  Alcohol - 3-4 beers/wkend   He lives with wife. 2 dogs. They have grown 4 children and 3 grandchildren  He works on cigarette machines, 3rd shift  Activity: goes to gym regularly - staying active with cardio Diet: good water, fruits/vegetables daily   Relevant past medical, surgical, family and social history reviewed and updated as indicated. Interim medical history since our last visit reviewed. Allergies and medications reviewed and updated. Outpatient Medications Prior to Visit  Medication Sig Dispense Refill  . ipratropium (ATROVENT) 0.03 % nasal spray Place 2 sprays into both nostrils 2 (two) times daily. Do not use for more than 5days. 30 mL 0  . Multiple Vitamins-Minerals (MULTIVITAMIN ADULTS 50+ PO) Take 1 tablet by mouth daily.    . Omega-3 Fatty Acids (FISH OIL) 1000 MG CAPS Take 1 capsule by mouth daily.    . sildenafil (REVATIO) 20 MG tablet Take 2-5 tablets (40-100 mg  total) by mouth daily as needed (relations). 30 tablet 5  . sodium chloride (OCEAN) 0.65 % SOLN nasal spray Place 1 spray into both nostrils as needed for congestion. 15 mL 0  . amoxicillin (AMOXIL) 875 MG tablet Take 1 tablet (875 mg total) by mouth 2 (two) times daily. 14 tablet 0  . dextromethorphan-guaiFENesin (MUCINEX DM) 30-600 MG 12hr tablet Take 1 tablet by mouth 2 (two) times daily as needed for cough. 14 tablet 0  . IBU 800 MG tablet TAKE ONE TABLET BY MOUTH TWICE A DAY AS NEEDED 40 tablet 0  . IBU 800 MG tablet TAKE ONE TABLET BY MOUTH TWICE A DAY AS NEEDED 40 tablet 0  . levothyroxine (SYNTHROID, LEVOTHROID) 100 MCG tablet TAKE ONE TABLET BY MOUTH DAILY BEFORE BREAKFAST 90 tablet 2  . metFORMIN (GLUCOPHAGE) 500 MG tablet Take 1 tablet (500 mg total) by mouth daily with breakfast. 90 tablet 3   No facility-administered medications prior to visit.      Per HPI unless specifically indicated in ROS section below Review of Systems  Constitutional: Negative for activity change, appetite change, chills, fatigue, fever and unexpected weight change.  HENT: Negative for hearing loss.   Eyes: Negative for visual disturbance.  Respiratory: Negative for cough, chest tightness, shortness of breath and wheezing.   Cardiovascular: Negative for chest pain, palpitations and leg swelling.  Gastrointestinal: Negative for abdominal distention, abdominal pain, blood in  stool, constipation, diarrhea, nausea and vomiting.  Genitourinary: Negative for difficulty urinating and hematuria.  Musculoskeletal: Negative for arthralgias, myalgias and neck pain.  Skin: Negative for rash.  Neurological: Negative for dizziness, seizures, syncope and headaches.  Hematological: Negative for adenopathy. Does not bruise/bleed easily.  Psychiatric/Behavioral: Negative for dysphoric mood. The patient is not nervous/anxious.        Objective:    BP 112/64 (BP Location: Left Arm, Patient Position: Sitting, Cuff Size:  Normal)   Pulse (!) 53   Temp 97.9 F (36.6 C) (Oral)   Ht 5' 4.5" (1.638 m)   Wt 187 lb 12 oz (85.2 kg)   SpO2 98%   BMI 31.73 kg/m   Wt Readings from Last 3 Encounters:  06/01/17 187 lb 12 oz (85.2 kg)  02/02/17 173 lb (78.5 kg)  11/18/16 176 lb 1.6 oz (79.9 kg)    Physical Exam  Constitutional: He is oriented to person, place, and time. He appears well-developed and well-nourished. No distress.  HENT:  Head: Normocephalic and atraumatic.  Right Ear: Hearing, tympanic membrane, external ear and ear canal normal.  Left Ear: Hearing, tympanic membrane, external ear and ear canal normal.  Nose: Nose normal.  Mouth/Throat: Uvula is midline, oropharynx is clear and moist and mucous membranes are normal. No oropharyngeal exudate, posterior oropharyngeal edema or posterior oropharyngeal erythema.  Eyes: Pupils are equal, round, and reactive to light. Conjunctivae and EOM are normal. No scleral icterus.  Neck: Normal range of motion. Neck supple. Carotid bruit is not present. No thyromegaly present.  Cardiovascular: Normal rate, regular rhythm, normal heart sounds and intact distal pulses.   No murmur heard. Pulses:      Radial pulses are 2+ on the right side, and 2+ on the left side.  Pulmonary/Chest: Effort normal and breath sounds normal. No respiratory distress. He has no wheezes. He has no rales.  Abdominal: Soft. Bowel sounds are normal. He exhibits no distension and no mass. There is no tenderness. There is no rebound and no guarding.  Musculoskeletal: Normal range of motion. He exhibits no edema.  R knee sleeve in place, FROM in flexion/extension  Lymphadenopathy:    He has no cervical adenopathy.  Neurological: He is alert and oriented to person, place, and time.  CN grossly intact, station and gait intact  Skin: Skin is warm and dry. No rash noted.  Psychiatric: He has a normal mood and affect. His behavior is normal. Judgment and thought content normal.  Nursing note and  vitals reviewed.  Results for orders placed or performed in visit on 05/27/17  Lipid panel  Result Value Ref Range   Cholesterol 202 (H) 0 - 200 mg/dL   Triglycerides 103.0 0.0 - 149.0 mg/dL   HDL 62.90 >39.00 mg/dL   VLDL 20.6 0.0 - 40.0 mg/dL   LDL Cholesterol 118 (H) 0 - 99 mg/dL   Total CHOL/HDL Ratio 3    NonHDL 161.09   Basic metabolic panel  Result Value Ref Range   Sodium 139 135 - 145 mEq/L   Potassium 3.9 3.5 - 5.1 mEq/L   Chloride 105 96 - 112 mEq/L   CO2 24 19 - 32 mEq/L   Glucose, Bld 167 (H) 70 - 99 mg/dL   BUN 18 6 - 23 mg/dL   Creatinine, Ser 0.73 0.40 - 1.50 mg/dL   Calcium 9.7 8.4 - 10.5 mg/dL   GFR 114.61 >60.00 mL/min  Hemoglobin A1c  Result Value Ref Range   Hgb A1c MFr Bld 7.5 (  H) 4.6 - 6.5 %  PSA  Result Value Ref Range   PSA 0.43 0.10 - 4.00 ng/mL  TSH  Result Value Ref Range   TSH 2.15 0.35 - 4.50 uIU/mL      Assessment & Plan:  Out of stock of pneumovax due to [facility] - will return for this. Problem List Items Addressed This Visit    Diabetes type 2, controlled ([place])    Chronic. Deteriorated. Will increase metformin to 536m bid. RTC 4 mo f/u visit. rec low carb, low sugar diet.       Relevant Medications   metFORMIN (GLUCOPHAGE) 500 MG tablet   Other Relevant Orders   Microalbumin / creatinine urine ratio   Basic metabolic panel   Hemoglobin A1c   Health maintenance examination - Primary    Preventative protocols reviewed and updated unless pt declined. Discussed healthy diet and lifestyle.       HLD (hyperlipidemia)    Chronic, deteriorated. Not on statin. Will need to discuss addition in setting of diabetes The 10-year ASCVD risk score (Mikey BussingDC JBrooke Bonito, et al., 2013) is: 16.6%   Values used to calculate the score:     Age: [age]years     Sex: Male     Is Non-Hispanic African American: No     Diabetic: Yes     Tobacco smoker: No     Systolic Blood Pressure: 1903mmHg     Is BP treated: No     HDL Cholesterol: 62.9 mg/dL      Total Cholesterol: 202 mg/dL       Hypothyroidism    Chronic, stable. Continue current regimen.       Relevant Medications   levothyroxine (SYNTHROID, LEVOTHROID) 100 MCG tablet   Left anterior knee pain    Anticipate knee sprain. Rx ibuprofen. Continue sleeve. Further supportive care reviewed.       Obesity, Class I, BMI 30.0-34.9 (see actual BMI)    Discussed healthy diet and lifestyle changes to affect sustainable weight loss.       Relevant Medications   metFORMIN (GLUCOPHAGE) 500 MG tablet   Shift work sleep disorder    Remains off ambien        Other Visit Diagnoses    Special screening for malignant neoplasms, colon       Relevant Orders   Fecal occult blood, imunochemical       Follow up plan: Return in about 4 months (around 10/01/2017) for follow up visit.  JRia Bush MD

## 2017-06-01 NOTE — Assessment & Plan Note (Addendum)
Anticipate knee sprain. Rx ibuprofen. Continue sleeve. Further supportive care reviewed.

## 2017-06-01 NOTE — Assessment & Plan Note (Signed)
Chronic, deteriorated. Not on statin. Will need to discuss addition in setting of diabetes The 10-year ASCVD risk score Mikey Bussing DC Brooke Bonito., et al., 2013) is: 16.6%   Values used to calculate the score:     Age: 66 years     Sex: Male     Is Non-Hispanic African American: No     Diabetic: Yes     Tobacco smoker: No     Systolic Blood Pressure: 993 mmHg     Is BP treated: No     HDL Cholesterol: 62.9 mg/dL     Total Cholesterol: 202 mg/dL

## 2017-06-01 NOTE — Assessment & Plan Note (Addendum)
Chronic. Deteriorated. Will increase metformin to 500mg  bid. RTC 4 mo f/u visit. rec low carb, low sugar diet.

## 2017-06-01 NOTE — Assessment & Plan Note (Signed)
Preventative protocols reviewed and updated unless pt declined. Discussed healthy diet and lifestyle.  

## 2017-06-01 NOTE — Assessment & Plan Note (Signed)
Chronic, stable. Continue current regimen. 

## 2017-06-01 NOTE — Patient Instructions (Addendum)
Pneumovax due Pass by lab to pick up stool kit.  Increase metformin to '500mg'$  twice daily with meals. Return in 4 months for diabetes follow up Work on healthy diet - low carb, low sugar - to help control diabetes.  Health Maintenance, Male A healthy lifestyle and preventive care is important for your health and wellness. Ask your health care provider about what schedule of regular examinations is right for you. What should I know about weight and diet? Eat a Healthy Diet  Eat plenty of vegetables, fruits, whole grains, low-fat dairy products, and lean protein.  Do not eat a lot of foods high in solid fats, added sugars, or salt.  Maintain a Healthy Weight Regular exercise can help you achieve or maintain a healthy weight. You should:  Do at least 150 minutes of exercise each week. The exercise should increase your heart rate and make you sweat (moderate-intensity exercise).  Do strength-training exercises at least twice a week.  Watch Your Levels of Cholesterol and Blood Lipids  Have your blood tested for lipids and cholesterol every 5 years starting at 65 years of age. If you are at high risk for heart disease, you should start having your blood tested when you are 65 years old. You may need to have your cholesterol levels checked more often if: ? Your lipid or cholesterol levels are high. ? You are older than 65 years of age. ? You are at high risk for heart disease.  What should I know about cancer screening? Many types of cancers can be detected early and may often be prevented. Lung Cancer  You should be screened every year for lung cancer if: ? You are a current smoker who has smoked for at least 30 years. ? You are a former smoker who has quit within the past 15 years.  Talk to your health care provider about your screening options, when you should start screening, and how often you should be screened.  Colorectal Cancer  Routine colorectal cancer screening usually  begins at 65 years of age and should be repeated every 5-10 years until you are 65 years old. You may need to be screened more often if early forms of precancerous polyps or small growths are found. Your health care provider may recommend screening at an earlier age if you have risk factors for colon cancer.  Your health care provider may recommend using home test kits to check for hidden blood in the stool.  A small camera at the end of a tube can be used to examine your colon (sigmoidoscopy or colonoscopy). This checks for the earliest forms of colorectal cancer.  Prostate and Testicular Cancer  Depending on your age and overall health, your health care provider may do certain tests to screen for prostate and testicular cancer.  Talk to your health care provider about any symptoms or concerns you have about testicular or prostate cancer.  Skin Cancer  Check your skin from head to toe regularly.  Tell your health care provider about any new moles or changes in moles, especially if: ? There is a change in a mole's size, shape, or color. ? You have a mole that is larger than a pencil eraser.  Always use sunscreen. Apply sunscreen liberally and repeat throughout the day.  Protect yourself by wearing long sleeves, pants, a wide-brimmed hat, and sunglasses when outside.  What should I know about heart disease, diabetes, and high blood pressure?  If you are 20-1 years of age, have  your blood pressure checked every 3-5 years. If you are 71 years of age or older, have your blood pressure checked every year. You should have your blood pressure measured twice-once when you are at a hospital or clinic, and once when you are not at a hospital or clinic. Record the average of the two measurements. To check your blood pressure when you are not at a hospital or clinic, you can use: ? An automated blood pressure machine at a pharmacy. ? A home blood pressure monitor.  Talk to your health care  provider about your target blood pressure.  If you are between 18-50 years old, ask your health care provider if you should take aspirin to prevent heart disease.  Have regular diabetes screenings by checking your fasting blood sugar level. ? If you are at a normal weight and have a low risk for diabetes, have this test once every three years after the age of 3. ? If you are overweight and have a high risk for diabetes, consider being tested at a younger age or more often.  A one-time screening for abdominal aortic aneurysm (AAA) by ultrasound is recommended for men aged 43-75 years who are current or former smokers. What should I know about preventing infection? Hepatitis B If you have a higher risk for hepatitis B, you should be screened for this virus. Talk with your health care provider to find out if you are at risk for hepatitis B infection. Hepatitis C Blood testing is recommended for:  Everyone born from [birth year] through 1964.  Anyone with known risk factors for hepatitis C.  Sexually Transmitted Diseases (STDs)  You should be screened each year for STDs including gonorrhea and chlamydia if: ? You are sexually active and are younger than 65 years of age. ? You are older than 65 years of age and your health care provider tells you that you are at risk for this type of infection. ? Your sexual activity has changed since you were last screened and you are at an increased risk for chlamydia or gonorrhea. Ask your health care provider if you are at risk.  Talk with your health care provider about whether you are at high risk of being infected with HIV. Your health care provider may recommend a prescription medicine to help prevent HIV infection.  What else can I do?  Schedule regular health, dental, and eye exams.  Stay current with your vaccines (immunizations).  Do not use any tobacco products, such as cigarettes, chewing tobacco, and e-cigarettes. If you need help quitting, ask  your health care provider.  Limit alcohol intake to no more than 2 drinks per day. One drink equals 12 ounces of beer, 5 ounces of wine, or 1 ounces of hard liquor.  Do not use street drugs.  Do not share needles.  Ask your health care provider for help if you need support or information about quitting drugs.  Tell your health care provider if you often feel depressed.  Tell your health care provider if you have ever been abused or do not feel safe at home. This information is not intended to replace advice given to you by your health care provider. Make sure you discuss any questions you have with your health care provider. Document Released: 02/28/2008 Document Revised: 04/30/2016 Document Reviewed: 06/05/2015 Elsevier Interactive Patient Education  [employer].

## 2017-06-01 NOTE — Assessment & Plan Note (Signed)
Discussed healthy diet and lifestyle changes to affect sustainable weight loss  

## 2017-06-01 NOTE — Assessment & Plan Note (Signed)
Remains off [employer]

## 2017-06-09 ENCOUNTER — Other Ambulatory Visit (INDEPENDENT_AMBULATORY_CARE_PROVIDER_SITE_OTHER): Payer: Managed Care, Other (non HMO)

## 2017-06-09 DIAGNOSIS — Z1211 Encounter for screening for malignant neoplasm of colon: Secondary | ICD-10-CM | POA: Diagnosis not present

## 2017-06-09 LAB — FECAL OCCULT BLOOD, GUAIAC: FECAL OCCULT BLD: NEGATIVE

## 2017-06-09 LAB — FECAL OCCULT BLOOD, IMMUNOCHEMICAL: Fecal Occult Bld: NEGATIVE

## 2017-06-11 ENCOUNTER — Encounter: Payer: Self-pay | Admitting: Family Medicine

## 2017-06-23 ENCOUNTER — Telehealth: Payer: Self-pay

## 2017-06-23 NOTE — Telephone Encounter (Signed)
Pt left v/m; pt is concerned that FBS is averaging 145 - 150 with an occasional FBS 125; pt believes should be better because watching what he eats; limiting carbs and taking metformin as prescribed. Pt request cb. [employer]. Last annual 06/01/17.

## 2017-06-24 ENCOUNTER — Encounter: Payer: Self-pay | Admitting: Family Medicine

## 2017-06-24 MED ORDER — METFORMIN HCL 500 MG PO TABS: 500.0000 mg | ORAL_TABLET | Freq: Three times a day (TID) | ORAL | 3 refills | Status: DC

## 2017-06-24 NOTE — Telephone Encounter (Signed)
Will continue titrating metformin up.

## 2017-06-24 NOTE — Telephone Encounter (Signed)
See mychart message  Lab Results  Component Value Date   HGBA1C 7.5 (H) 05/27/2017

## 2017-06-29 ENCOUNTER — Ambulatory Visit: Payer: Managed Care, Other (non HMO) | Admitting: Family Medicine

## 2017-06-29 ENCOUNTER — Encounter: Payer: Self-pay | Admitting: Family Medicine

## 2017-06-29 ENCOUNTER — Telehealth: Payer: Self-pay | Admitting: Family Medicine

## 2017-06-29 ENCOUNTER — Ambulatory Visit (INDEPENDENT_AMBULATORY_CARE_PROVIDER_SITE_OTHER): Payer: Managed Care, Other (non HMO) | Admitting: Family Medicine

## 2017-06-29 VITALS — BP 120/68 | HR 72 | Temp 98.3°F | Wt 180.8 lb

## 2017-06-29 DIAGNOSIS — G4726 Circadian rhythm sleep disorder, shift work type: Secondary | ICD-10-CM | POA: Diagnosis not present

## 2017-06-29 DIAGNOSIS — E119 Type 2 diabetes mellitus without complications: Secondary | ICD-10-CM

## 2017-06-29 MED ORDER — TEMAZEPAM 7.5 MG PO CAPS: 7.5000 mg | ORAL_CAPSULE | Freq: Every evening | ORAL | 0 refills | Status: DC | PRN

## 2017-06-29 NOTE — Patient Instructions (Addendum)
I do recommend eventually transitioning to day shift.  In interim, trial temazepam (restoril) 7.5mg  as needed for sleep. This will be temporary medication to help with sleep, use sparingly.  Increase metformin to three times daily with each meal. Metformin takes about 1-2 months to take full effect. Let me know after 3 weeks on metformin three times daily effect.

## 2017-06-29 NOTE — Progress Notes (Signed)
BP 120/68 (BP Location: Left Arm, Patient Position: Sitting, Cuff Size: Normal)   Pulse 72   Temp 98.3 F (36.8 C) (Oral)   Wt 180 lb 12 oz (82 kg)   SpO2 95%   BMI 30.55 kg/m    CC: DM concerns Subjective:    Patient ID: Kevin Harder., male    DOB: 07/06/1951, 66 y.o.   MRN: [id number]  HPI: Kevin Pelzer. is a 66 y.o. male presenting on 06/29/2017 for Questions about meds (Wants to discuss metformin. Says BS continue to be high even after taking metformin for awhile. Wants to know if there is anything else he can take.)   Pt concerned fasting sugars average 145-150 despite metformin 571m bid. Last night 1am sugar 211, 8am 2 hours post prandial 160. 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 Melatonin was not effective. Prior concern for sleep parasomnias while on ambien as well as worsening memory. Sometimes able to take power naps during 3rd shift.   See recent pt email.   Lab Results  Component Value Date   HGBA1C 7.5 (H) 05/27/2017     Relevant past medical, surgical, family and social history reviewed and updated as indicated. Interim medical history since our last visit reviewed. Allergies and medications reviewed and updated. Outpatient Medications Prior to Visit  Medication Sig Dispense Refill  . Blood Glucose Monitoring Suppl (ONE TOUCH ULTRA MINI) w/Device KIT Use as directed 1 each 0  . glucose blood (ONE TOUCH ULTRA TEST) test strip Use as instructed 100 each 3  . ibuprofen (IBU) 800 MG tablet Take 1 tablet (800 mg total) by mouth 2 (two) times daily as needed. 50 tablet 3  . ipratropium (ATROVENT) 0.03 % nasal spray Place 2 sprays into both nostrils 2 (two) times daily. Do not use for more than 5days. 30 mL 0  . levothyroxine (SYNTHROID, LEVOTHROID) 100 MCG tablet Take 1 tablet (100 mcg total) by mouth daily before breakfast. 90 tablet 3  . metFORMIN (GLUCOPHAGE) 500 MG tablet Take 1 tablet (500 mg  total) by mouth 3 (three) times daily before meals. 270 tablet 3  . Multiple Vitamins-Minerals (MULTIVITAMIN ADULTS 50+ PO) Take 1 tablet by mouth daily.    . Omega-3 Fatty Acids (FISH OIL) 1000 MG CAPS Take 1 capsule by mouth daily.    . sildenafil (REVATIO) 20 MG tablet Take 2-5 tablets (40-100 mg total) by mouth daily as needed (relations). 30 tablet 5  . sodium chloride (OCEAN) 0.65 % SOLN nasal spray Place 1 spray into both nostrils as needed for congestion. 15 mL 0   No facility-administered medications prior to visit.      Per HPI unless specifically indicated in ROS section below Review of Systems     Objective:    BP 120/68 (BP Location: Left Arm, Patient Position: Sitting, Cuff Size: Normal)   Pulse 72   Temp 98.3 F (36.8 C) (Oral)   Wt 180 lb 12 oz (82 kg)   SpO2 95%   BMI 30.55 kg/m   Wt Readings from Last 3 Encounters:  06/29/17 180 lb 12 oz (82 kg)  06/01/17 187 lb 12 oz (85.2 kg)  02/02/17 173 lb (78.5 kg)    Physical Exam  Constitutional: He appears well-developed and well-nourished. No distress.  Psychiatric: He has a normal mood and affect.  Nursing note and vitals reviewed.  Results for orders placed or performed in visit on 06/11/17  Fecal  Occult Blood, Guaiac  Result Value Ref Range   Fecal Occult Blood Negative       Assessment & Plan:   Problem List Items Addressed This Visit    Diabetes type 2, controlled ([place]) - Primary    Chronic. Shift work complicates attainment of control. Pt remains concerned about elevated readings despite metformin use and changing to low carb diet. Discussed long acting nature of metformin, will recommend increasing metformin to TID AC. Pt will update me with readings in 2-3 wks.       Shift work sleep disorder    Discussed how a change to day shift could resolve this issue - he will work towards normalizing schedule in 2019.  Melatonin ineffective.  Avoid ambien given concern for parasomnias and need for high dose.    Will trial restoril 7.52m nightly PRN sleep, reviewed controlled substance use, possible habit forming potential as well as monitoring for dependence/tolerance, advised to avoid alcohol while using this medicine.           Follow up plan: No Follow-up on file.  JRia Bush MD

## 2017-06-29 NOTE — Assessment & Plan Note (Addendum)
Discussed how a change to day shift could resolve this issue - he will work towards normalizing schedule in 2019.  Melatonin ineffective.  Avoid ambien given concern for parasomnias and need for high dose.  Will trial restoril 7.5mg  nightly PRN sleep, reviewed controlled substance use, possible habit forming potential as well as monitoring for dependence/tolerance, advised to avoid alcohol while using this medicine.

## 2017-06-29 NOTE — Assessment & Plan Note (Signed)
Chronic. Shift work complicates attainment of control. Pt remains concerned about elevated readings despite metformin use and changing to low carb diet. Discussed long acting nature of metformin, will recommend increasing metformin to TID AC. Pt will update me with readings in 2-3 wks.

## 2017-06-29 NOTE — Telephone Encounter (Signed)
Best number [phone]   Please call pt he has ? About his meds (metformin)  He has question about diabetes.  Disregard pt made appointment today 10/15 with dr g @ 2:30

## 2017-06-29 NOTE — Telephone Encounter (Signed)
Noted  

## 2017-07-02 ENCOUNTER — Telehealth: Payer: Self-pay

## 2017-07-02 NOTE — Telephone Encounter (Signed)
Kristopher Oppenheim request status of new rx for metformin with increasing to tid c meals. H/T  will transfer rx from [place].

## 2017-07-03 ENCOUNTER — Other Ambulatory Visit: Payer: Self-pay

## 2017-07-03 MED ORDER — GLUCOSE BLOOD VI STRP: ORAL_STRIP | 6 refills | Status: DC

## 2017-07-03 NOTE — Telephone Encounter (Signed)
Kevin Sheppard at [facility] left v/m that needs new rx for test strips to have specific instructions. Pt said test tid. Pt last seen 06/29/17.Please advise.

## 2017-07-03 NOTE — Telephone Encounter (Signed)
Sent in

## 2017-07-15 ENCOUNTER — Ambulatory Visit (INDEPENDENT_AMBULATORY_CARE_PROVIDER_SITE_OTHER): Payer: 59 | Admitting: Endocrinology

## 2017-07-15 ENCOUNTER — Encounter: Payer: Self-pay | Admitting: Endocrinology

## 2017-07-15 VITALS — BP 124/82 | HR 59 | Wt 180.0 lb

## 2017-07-15 DIAGNOSIS — E1165 Type 2 diabetes mellitus with hyperglycemia: Secondary | ICD-10-CM

## 2017-07-15 DIAGNOSIS — IMO0001 Reserved for inherently not codable concepts without codable children: Secondary | ICD-10-CM

## 2017-07-15 DIAGNOSIS — E039 Hypothyroidism, unspecified: Secondary | ICD-10-CM

## 2017-07-15 MED ORDER — LINAGLIPTIN 5 MG PO TABS: 5.0000 mg | ORAL_TABLET | Freq: Every day | ORAL | 11 refills | Status: DC

## 2017-07-15 NOTE — Progress Notes (Signed)
Subjective:    Patient ID: Kevin Sheppard., male    DOB: 14-May-1951, 66 y.o.   MRN: [id number]  HPI Pt returns for f/u of diabetes mellitus: DM type: 2 Dx'ed: 8416 Complications: none Therapy: januvia DKA: never.  Severe hypoglycemia: never Pancreatitis: never Pancreatic imaging: never.  Other: he has never been on insulin Interval history: pt states he feels well in general.  no cbg record, but states cbg's are in the low to mid-100's.  pt states he feels well in general.   Past Medical History:  Diagnosis Date  . Abnormal drug screen 8/2-05/2015   +MJ pt states one time thing (04/2015)  . Diabetes type 2, controlled ([place])   . History of chicken pox   . History of colon polyps    benign  . HLD (hyperlipidemia)   . Hypothyroid   . 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 COLONOSCOPY  2004;2009   first with b9 polyps, then diverticulosis no polyps, rpt 86yr ([place])  . MENISCUS REPAIR Bilateral 2002;2008    Social History   Social History  . Marital status: Married    Spouse name: N/A  . Number of children: 4  . Years of education: N/A   Occupational History  . line adjuster    Social History Main Topics  . Smoking status: Never Smoker  . Smokeless tobacco: Never Used  . Alcohol use 0.0 oz/week     Comment: 12pack every weekend.   . Drug use: No  . Sexual activity: Not on file   Other Topics Concern  . Not on file   Social History Narrative   He lives with wife.  2 dogs. They have grown 4 children and 3 grandchildren.    He works on cigarette machines, 3rd shift   Activity: goes to gym regularly   Diet: good water, fruits/vegetables daily    Current Outpatient Prescriptions on File Prior to Visit  Medication Sig Dispense Refill  . Blood Glucose Monitoring Suppl (ONE TOUCH ULTRA MINI) w/Device KIT Use as directed  1 each 0  . glucose blood (ONE TOUCH ULTRA TEST) test strip Check three times daily and as needed E11.65 100 each 6  . ibuprofen (IBU) 800 MG tablet Take 1 tablet (800 mg total) by mouth 2 (two) times daily as needed. 50 tablet 3  . ipratropium (ATROVENT) 0.03 % nasal spray Place 2 sprays into both nostrils 2 (two) times daily. Do not use for more than 5days. 30 mL 0  . levothyroxine (SYNTHROID, LEVOTHROID) 100 MCG tablet Take 1 tablet (100 mcg total) by mouth daily before breakfast. 90 tablet 3  . metFORMIN (GLUCOPHAGE) 500 MG tablet Take 1 tablet (500 mg total) by mouth 3 (three) times daily before meals. 270 tablet 3  . Multiple Vitamins-Minerals (MULTIVITAMIN ADULTS 50+ PO) Take 1 tablet by mouth daily.    . Omega-3 Fatty Acids (FISH OIL) 1000 MG CAPS Take 1 capsule by mouth daily.    . sildenafil (REVATIO) 20 MG tablet Take 2-5 tablets (40-100 mg total) by mouth daily as needed (relations). 30 tablet 5  . sodium chloride (OCEAN) 0.65 % SOLN nasal spray Place 1 spray into both nostrils as needed for congestion. 15 mL 0  . temazepam (RESTORIL) 7.5 MG capsule Take 1 capsule (7.5 mg total) by mouth at bedtime as needed for sleep. 30 capsule  0   No current facility-administered medications on file prior to visit.     Allergies  Allergen Reactions  . Lyrica [Pregabalin] Rash    Family History  Problem Relation Age of Onset  . Stroke Father   . Diabetes Father   . Other Mother        blood disease, died 88 (?porphyria)  . Diabetes type II Brother   . Diabetes type II Maternal Aunt   . Hypertension Sister   . Stroke Sister 91  . Thyroid disease Sister   . Cancer Neg Hx   . CAD Neg Hx     BP 124/82   Pulse (!) 59   Wt 180 lb (81.6 kg)   SpO2 98%   BMI 30.42 kg/m    Review of Systems He denies hypoglycemia.     Objective:   Physical Exam VITAL SIGNS:  See vs page GENERAL: no distress Pulses: foot pulses are intact bilaterally.   MSK: no deformity of the feet or ankles.    CV: no edema of the legs or ankles Skin:  no ulcer on the feet or ankles.  normal color and temp on the feet and ankles Neuro: sensation is intact to touch on the feet and ankles.     Lab Results  Component Value Date   TSH 2.15 05/27/2017   Lab Results  Component Value Date   HGBA1C 7.5 (H) 05/27/2017       Assessment & Plan:  Type 2 DM: he needs increased rx, if it can be done with a regimen that avoids or minimizes hypoglycemia. Hypothyroidism: well-replaced  Patient Instructions  Please continue the same metformin, and: I have sent a prescription to your pharmacy, to add "tradjenta."  Her is a discount card.   check your blood sugar once a day.  vary the time of day when you check, between before the 3 meals, and at bedtime.  also check if you have symptoms of your blood sugar being too high or too low.  please keep a record of the readings and bring it to your next appointment here (or you can bring the meter itself).  You can write it on any piece of paper.  please call us sooner if your blood sugar goes below 70, or if you have a lot of readings over 200. Please continue the same levothyroxine. I would be happy to see you back here as needed.

## 2017-07-15 NOTE — Patient Instructions (Signed)
Please continue the same metformin, and: I have sent a prescription to your pharmacy, to add "tradjenta."  Her is a discount card.   check your blood sugar once a day.  vary the time of day when you check, between before the 3 meals, and at bedtime.  also check if you have symptoms of your blood sugar being too high or too low.  please keep a record of the readings and bring it to your next appointment here (or you can bring the meter itself).  You can write it on any piece of paper.  please call us sooner if your blood sugar goes below 70, or if you have a lot of readings over 200. Please continue the same levothyroxine. I would be happy to see you back here as needed.

## 2017-08-04 ENCOUNTER — Other Ambulatory Visit: Payer: Self-pay | Admitting: Family Medicine

## 2017-08-04 NOTE — Telephone Encounter (Signed)
Last filled:  06/29/17, #30 Last OV:  06/29/17 Next OV:  10/01/17

## 2017-08-05 NOTE — Telephone Encounter (Signed)
plz phone in. 

## 2017-08-05 NOTE — Telephone Encounter (Signed)
Refill left on vm at pharmacy per Dr. G.   

## 2017-09-28 ENCOUNTER — Other Ambulatory Visit: Payer: Managed Care, Other (non HMO)

## 2017-10-01 ENCOUNTER — Ambulatory Visit: Payer: Managed Care, Other (non HMO) | Admitting: Family Medicine

## 2017-10-02 ENCOUNTER — Other Ambulatory Visit: Payer: Self-pay | Admitting: Family Medicine

## 2017-10-05 NOTE — Telephone Encounter (Signed)
Last filled:  08/05/17, #30 Last OV:  06/29/17 Next OV:  10/19/17

## 2017-10-08 NOTE — Telephone Encounter (Signed)
Sent electronically 

## 2017-10-19 ENCOUNTER — Ambulatory Visit: Payer: 59 | Admitting: Family Medicine

## 2017-11-02 ENCOUNTER — Encounter: Payer: Self-pay | Admitting: Family Medicine

## 2017-11-02 ENCOUNTER — Ambulatory Visit: Payer: 59 | Admitting: Family Medicine

## 2017-11-02 VITALS — BP 122/68 | HR 55 | Temp 98.6°F | Wt 184.0 lb

## 2017-11-02 DIAGNOSIS — IMO0001 Reserved for inherently not codable concepts without codable children: Secondary | ICD-10-CM

## 2017-11-02 DIAGNOSIS — G4726 Circadian rhythm sleep disorder, shift work type: Secondary | ICD-10-CM

## 2017-11-02 DIAGNOSIS — E1165 Type 2 diabetes mellitus with hyperglycemia: Secondary | ICD-10-CM | POA: Diagnosis not present

## 2017-11-02 LAB — HEMOGLOBIN A1C: HEMOGLOBIN A1C: 7.4 % — AB (ref 4.6–6.5)

## 2017-11-02 LAB — BASIC METABOLIC PANEL
BUN: 14 mg/dL (ref 6–23)
CO2: 27 mEq/L (ref 19–32)
CREATININE: 0.69 mg/dL (ref 0.40–1.50)
Calcium: 9.3 mg/dL (ref 8.4–10.5)
Chloride: 103 mEq/L (ref 96–112)
GFR: 122.14 mL/min (ref >60.00)
Glucose, Bld: 185 mg/dL — ABNORMAL HIGH (ref 70–99)
POTASSIUM: 4.3 meq/L (ref 3.5–5.1)
Sodium: 138 mEq/L (ref 135–145)

## 2017-11-02 LAB — MICROALBUMIN / CREATININE URINE RATIO
Creatinine,U: 79.1 mg/dL
Microalb Creat Ratio: 0.9 mg/g (ref 0.0–30.0)

## 2017-11-02 NOTE — Assessment & Plan Note (Signed)
Sleep is doing well. He is using restoril sparingly.

## 2017-11-02 NOTE — Patient Instructions (Addendum)
Labs today - we will be in touch with results. Continue metformin and tradjenta.  Urine check today.

## 2017-11-02 NOTE — Assessment & Plan Note (Addendum)
Anticipate good control based on recall cbg's. Pt wants tight glycemic control however. Will check A1c today and be in touch with results. Pt desires close 39mo f/u for DM. Discussed diet, meal routine in setting of 3rd shift schedule.

## 2017-11-02 NOTE — Progress Notes (Signed)
BP 122/68 (BP Location: Left Arm, Patient Position: Sitting, Cuff Size: Normal)   Pulse (!) 55   Temp 98.6 F (37 C) (Oral)   Wt 184 lb (83.5 kg)   SpO2 97%   BMI 31.10 kg/m    CC: 4 mo f/u visit Subjective:    Patient ID: Kevin Sheppard., male    DOB: [birth year], 67 y.o.   MRN: [id number]  HPI: Kevin Vandevoort. is a 67 y.o. male presenting on 11/02/2017 for 4 mo follow-up   Non smoker. Wife smokes at home.   Able to get one physical per calendar year.   Rough month - bad head cold, hurt back, etc.  DM - saw endo - started on tradjenta. Does not regularly check sugars - has not checked the last few weeks. Compliant with antihyperglycemic regimen which includes: metformin 568m tid, tradjenta 581mdaily. Improved readings but persistently elevated. Denies low sugars or hypoglycemic symptoms. Denies paresthesias. Last diabetic eye exam 10/2016. Pneumovax: 05/2014. Prevnar: DUE. Glucometer brand: one touch. DSME: ~2001. Foot exam: 06/2017. 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 Wants to restart exercise routine (swimming). Also walks dog 3-4 mi/day.  Lab Results  Component Value Date   HGBA1C 7.5 (H) 05/27/2017   Diabetic Foot Exam - Simple   No data filed     Lab Results  Component Value Date   MICROALBUR 0.7 04/29/2016     Sleeping better - rarely needing restoril.   Relevant past medical, surgical, family and social history reviewed and updated as indicated. Interim medical history since our last visit reviewed. Allergies and medications reviewed and updated. Outpatient Medications Prior to Visit  Medication Sig Dispense Refill  . Blood Glucose Monitoring Suppl (ONE TOUCH ULTRA MINI) w/Device KIT Use as directed 1 each 0  . glucose blood (ONE TOUCH ULTRA TEST) test strip Check three times daily and as needed E11.65 100 each 6  . ibuprofen (IBU) 800 MG tablet Take 1 tablet (800 mg total) by mouth 2 (two) times daily as needed. 50 tablet 3  . ipratropium  (ATROVENT) 0.03 % nasal spray Place 2 sprays into both nostrils 2 (two) times daily. Do not use for more than 5days. 30 mL 0  . levothyroxine (SYNTHROID, LEVOTHROID) 100 MCG tablet Take 1 tablet (100 mcg total) by mouth daily before breakfast. 90 tablet 3  . linagliptin (TRADJENTA) 5 MG TABS tablet Take 1 tablet (5 mg total) by mouth daily. 30 tablet 11  . metFORMIN (GLUCOPHAGE) 500 MG tablet Take 1 tablet (500 mg total) by mouth 3 (three) times daily before meals. 270 tablet 3  . Multiple Vitamins-Minerals (MULTIVITAMIN ADULTS 50+ PO) Take 1 tablet by mouth daily.    . Omega-3 Fatty Acids (FISH OIL) 1000 MG CAPS Take 1 capsule by mouth daily.    . sildenafil (REVATIO) 20 MG tablet Take 2-5 tablets (40-100 mg total) by mouth daily as needed (relations). 30 tablet 5  . sodium chloride (OCEAN) 0.65 % SOLN nasal spray Place 1 spray into both nostrils as needed for congestion. 15 mL 0  . temazepam (RESTORIL) 7.5 MG capsule TAKE ONE CAPSULE BY MOUTH AT BEDTIME AS NEEDED FOR SLEEP. USE SPARINGLY!! 30 capsule 0   No facility-administered medications prior to visit.      Per HPI unless specifically indicated in ROS section below Review of Systems     Objective:    BP 122/68 (BP Location: Left Arm, Patient Position: Sitting, Cuff Size: Normal)   Pulse (!Marland Kitchen  55   Temp 98.6 F (37 C) (Oral)   Wt 184 lb (83.5 kg)   SpO2 97%   BMI 31.10 kg/m   Wt Readings from Last 3 Encounters:  11/02/17 184 lb (83.5 kg)  07/15/17 180 lb (81.6 kg)  06/29/17 180 lb 12 oz (82 kg)    Physical Exam  Constitutional: He appears well-developed and well-nourished. No distress.  HENT:  Head: Normocephalic and atraumatic.  Right Ear: External ear normal.  Left Ear: External ear normal.  Nose: Nose normal.  Mouth/Throat: Oropharynx is clear and moist. No oropharyngeal exudate.  Eyes: Conjunctivae and EOM are normal. Pupils are equal, round, and reactive to light. No scleral icterus.  Neck: Normal range of motion.  Neck supple.  Cardiovascular: Normal rate, regular rhythm, normal heart sounds and intact distal pulses.  No murmur heard. Pulmonary/Chest: Effort normal and breath sounds normal. No respiratory distress. He has no wheezes. He has no rales.  Musculoskeletal: He exhibits no edema.  See HPI for foot exam if done  Lymphadenopathy:    He has no cervical adenopathy.  Skin: Skin is warm and dry. No rash noted.  Psychiatric: He has a normal mood and affect.  Nursing note and vitals reviewed.  Results for orders placed or performed in visit on 06/11/17  Fecal Occult Blood, Guaiac  Result Value Ref Range   Fecal Occult Blood Negative       Assessment & Plan:   Problem List Items Addressed This Visit    Diabetes mellitus type 2, uncontrolled, without complications ([place]) - Primary    Anticipate good control based on recall cbg's. Pt wants tight glycemic control however. Will check A1c today and be in touch with results. Pt desires close 9mof/u for DM. Discussed diet, meal routine in setting of 3rd shift schedule.       Relevant Orders   Hemoglobin A1c   Shift work sleep disorder    Sleep is doing well. He is using restoril sparingly.          No orders of the defined types were placed in this encounter.  Orders Placed This Encounter  Procedures  . Hemoglobin A1c    Follow up plan: Return in about 4 months (around 03/02/2018) for follow up visit.  JRia Bush MD

## 2017-11-13 LAB — HM DIABETES EYE EXAM

## 2018-01-08 ENCOUNTER — Other Ambulatory Visit: Payer: Self-pay | Admitting: Family Medicine

## 2018-03-08 ENCOUNTER — Ambulatory Visit: Payer: 59 | Admitting: Family Medicine

## 2018-03-08 ENCOUNTER — Encounter: Payer: Self-pay | Admitting: Family Medicine

## 2018-03-08 VITALS — BP 120/74 | HR 55 | Temp 98.0°F | Ht 65.0 in | Wt 180.0 lb

## 2018-03-08 DIAGNOSIS — IMO0001 Reserved for inherently not codable concepts without codable children: Secondary | ICD-10-CM

## 2018-03-08 DIAGNOSIS — E1165 Type 2 diabetes mellitus with hyperglycemia: Secondary | ICD-10-CM

## 2018-03-08 LAB — POCT GLYCOSYLATED HEMOGLOBIN (HGB A1C): HEMOGLOBIN A1C: 7.5 % — AB (ref 4.0–5.6)

## 2018-03-08 NOTE — Progress Notes (Signed)
BP 120/74 (BP Location: Left Arm, Patient Position: Sitting, Cuff Size: Normal)   Pulse (!) 55   Temp 98 F (36.7 C) (Oral)   Ht _0  (1.651 m)   Wt 180 lb (81.6 kg)   SpO2 96%   BMI 29.95 kg/m    CC: DM f/u Subjective:    Patient ID: Kevin Harder., male    DOB: 1951/04/15, 67 y.o.   MRN: [id number]  HPI: Kevin Bembenek. is a 67 y.o. male presenting on 03/08/2018 for Diabetes (Here for 4 mo f/u. )   Activity somewhat limited by knee pain. Walks 3-4 mi/day.   DM - does not regularly check sugars. Last checked a few week ago fasting 119. Compliant with antihyperglycemic regimen which includes: tradjenta 89m, metformin 5015mTID AC. Denies low sugars or hypoglycemic symptoms. Denies paresthesias. Last diabetic eye exam 11/2017. Pneumovax: ?. Marland Kitchenrevnar: 05/2014 - per records?. Glucometer brand: one-touch. DSME: ~2001. Lab Results  Component Value Date   HGBA1C 7.5 (A) 03/08/2018   Diabetic Foot Exam - Simple   Simple Foot Form Diabetic Foot exam was performed with the following findings:  Yes 03/08/2018  5:06 PM  Visual Inspection See comments:  Yes Sensation Testing Intact to touch and monofilament testing bilaterally:  Yes Pulse Check Posterior Tibialis and Dorsalis pulse intact bilaterally:  Yes Comments Maceration between toes bilaterally    Lab Results  Component Value Date   MICROALBUR <0.7 11/02/2017     Relevant past medical, surgical, family and social history reviewed and updated as indicated. Interim medical history since our last visit reviewed. Allergies and medications reviewed and updated. Outpatient Medications Prior to Visit  Medication Sig Dispense Refill  . Blood Glucose Monitoring Suppl (ONE TOUCH ULTRA MINI) w/Device KIT Use as directed 1 each 0  . glucose blood (ONE TOUCH ULTRA TEST) test strip Check three times daily and as needed E11.65 100 each 6  . ibuprofen (IBU) 800 MG tablet Take 1 tablet (800 mg total) by mouth 2 (two) times daily as needed.  50 tablet 3  . ipratropium (ATROVENT) 0.03 % nasal spray Place 2 sprays into both nostrils 2 (two) times daily. Do not use for more than 5days. 30 mL 0  . levothyroxine (SYNTHROID, LEVOTHROID) 100 MCG tablet Take 1 tablet (100 mcg total) by mouth daily before breakfast. 90 tablet 3  . linagliptin (TRADJENTA) 5 MG TABS tablet Take 1 tablet (5 mg total) by mouth daily. 30 tablet 11  . metFORMIN (GLUCOPHAGE) 500 MG tablet Take 1 tablet (500 mg total) by mouth 3 (three) times daily before meals. 270 tablet 3  . Multiple Vitamins-Minerals (MULTIVITAMIN ADULTS 50+ PO) Take 1 tablet by mouth daily.    . Omega-3 Fatty Acids (FISH OIL) 1000 MG CAPS Take 1 capsule by mouth daily.    . sildenafil (REVATIO) 20 MG tablet Take 2-5 tablets (40-100 mg total) by mouth daily as needed (relations). 30 tablet 5  . sildenafil (REVATIO) 20 MG tablet TAKE 2-5 TABLETS BY MOUTH DAILY AS NEEDED 30 tablet 2  . sodium chloride (OCEAN) 0.65 % SOLN nasal spray Place 1 spray into both nostrils as needed for congestion. 15 mL 0  . temazepam (RESTORIL) 7.5 MG capsule TAKE ONE CAPSULE BY MOUTH AT BEDTIME AS NEEDED FOR SLEEP. USE SPARINGLY!! 30 capsule 0   No facility-administered medications prior to visit.      Per HPI unless specifically indicated in ROS section below Review of Systems     Objective:  BP 120/74 (BP Location: Left Arm, Patient Position: Sitting, Cuff Size: Normal)   Pulse (!) 55   Temp 98 F (36.7 C) (Oral)   Ht _0  (1.651 m)   Wt 180 lb (81.6 kg)   SpO2 96%   BMI 29.95 kg/m   Wt Readings from Last 3 Encounters:  03/08/18 180 lb (81.6 kg)  11/02/17 184 lb (83.5 kg)  07/15/17 180 lb (81.6 kg)    Physical Exam  Constitutional: He appears well-developed and well-nourished. No distress.  HENT:  Head: Normocephalic and atraumatic.  Right Ear: External ear normal.  Left Ear: External ear normal.  Nose: Nose normal.  Mouth/Throat: Oropharynx is clear and moist. No oropharyngeal exudate.    Eyes: Pupils are equal, round, and reactive to light. Conjunctivae and EOM are normal. No scleral icterus.  Neck: Normal range of motion. Neck supple.  Cardiovascular: Normal rate, regular rhythm, normal heart sounds and intact distal pulses.  No murmur heard. Pulmonary/Chest: Effort normal and breath sounds normal. No respiratory distress. He has no wheezes. He has no rales.  Musculoskeletal: He exhibits no edema.  See HPI for foot exam if done  Lymphadenopathy:    He has no cervical adenopathy.  Skin: Skin is warm and dry. No rash noted.  Psychiatric: He has a normal mood and affect.  Nursing note and vitals reviewed.  Results for orders placed or performed in visit on 03/08/18  POCT glycosylated hemoglobin (Hb A1C)  Result Value Ref Range   Hemoglobin A1C 7.5 (A) 4.0 - 5.6 %   HbA1c POC (<> result, manual entry)  4.0 - 5.6 %   HbA1c, POC (prediabetic range)  5.7 - 6.4 %   HbA1c, POC (controlled diabetic range)  0.0 - 7.0 %  HM DIABETES EYE EXAM  Result Value Ref Range   HM Diabetic Eye Exam No Retinopathy No Retinopathy      Assessment & Plan:   Problem List Items Addressed This Visit    Diabetes mellitus type 2, uncontrolled, without complications ([place]) - Primary    Chronic, remains uncontrolled based on A1c. Not regularly checking sugars. He does well with regular exercise routine. Encouraged closer adherence to diabetic diet and more regular sugar check. Requests another 4 months of lifestyle modification prior to titrating medication. Reassess at 78mof/u visit.       Relevant Orders   POCT glycosylated hemoglobin (Hb A1C) (Completed)       No orders of the defined types were placed in this encounter.  Orders Placed This Encounter  Procedures  . POCT glycosylated hemoglobin (Hb A1C)  . HM DIABETES EYE EXAM    This external order was created through the Results Console.    Follow up plan: Return in about 4 months (around 07/08/2018) for annual exam, prior fasting  for blood work.  JRia Bush MD

## 2018-03-08 NOTE — Assessment & Plan Note (Signed)
Chronic, remains uncontrolled based on A1c. Not regularly checking sugars. He does well with regular exercise routine. Encouraged closer adherence to diabetic diet and more regular sugar check. Requests another 4 months of lifestyle modification prior to titrating medication. Reassess at 44mo f/u visit.

## 2018-03-08 NOTE — Patient Instructions (Addendum)
Continue lotrimin cream for areas between toes Continue current medicines.  Work on diabetic diet. We will recheck levels next visit.  Return in 4 months for physical.

## 2018-04-21 ENCOUNTER — Encounter: Payer: Self-pay | Admitting: Family Medicine

## 2018-04-23 MED ORDER — METFORMIN HCL 1000 MG PO TABS: 1000.0000 mg | ORAL_TABLET | Freq: Two times a day (BID) | ORAL | 2 refills | Status: DC

## 2018-04-26 ENCOUNTER — Ambulatory Visit: Payer: 59 | Admitting: Family Medicine

## 2018-04-26 ENCOUNTER — Encounter: Payer: Self-pay | Admitting: Family Medicine

## 2018-04-26 VITALS — BP 118/70 | HR 54 | Temp 98.2°F | Ht 65.0 in | Wt 176.2 lb

## 2018-04-26 DIAGNOSIS — IMO0001 Reserved for inherently not codable concepts without codable children: Secondary | ICD-10-CM

## 2018-04-26 DIAGNOSIS — E1165 Type 2 diabetes mellitus with hyperglycemia: Secondary | ICD-10-CM

## 2018-04-26 DIAGNOSIS — G4726 Circadian rhythm sleep disorder, shift work type: Secondary | ICD-10-CM

## 2018-04-26 LAB — POCT GLYCOSYLATED HEMOGLOBIN (HGB A1C): HEMOGLOBIN A1C: 6.8 % — AB (ref 4.0–5.6)

## 2018-04-26 MED ORDER — IBUPROFEN 800 MG PO TABS: 800.0000 mg | ORAL_TABLET | Freq: Two times a day (BID) | ORAL | 3 refills | Status: DC | PRN

## 2018-04-26 MED ORDER — TEMAZEPAM 15 MG PO CAPS: 15.0000 mg | ORAL_CAPSULE | Freq: Every evening | ORAL | 0 refills | Status: DC | PRN

## 2018-04-26 NOTE — Assessment & Plan Note (Signed)
Chronic, stable. Improved based on A1c today. Pt monitoring diet, increasing exercise regimen. Sustainable changes. Congratulated. Continue current regimen. Discussed metformin dosing.

## 2018-04-26 NOTE — Patient Instructions (Addendum)
Sugars are doing better.  Continue current regimen.  Try restoril (temazepam) 15mg  at a time - new dose sent to pharmacy.  If interested, check with pharmacy about new 2 shot shingles series (shingrix).

## 2018-04-26 NOTE — Assessment & Plan Note (Signed)
Recent stress of wife's illness exacerbating difficulty with sleep. Will increase restoril to 15mg  qhs prn insomnia. Discussed want to avoid ambien.

## 2018-04-26 NOTE — Progress Notes (Signed)
BP 118/70 (BP Location: Left Arm, Patient Position: Sitting, Cuff Size: Normal)   Pulse (!) 54   Temp 98.2 F (36.8 C) (Oral)   Ht '5\' 5"'  (1.651 m)   Wt 176 lb 4 oz (79.9 kg)   SpO2 96%   BMI 29.33 kg/m    CC: discuss DM, sleep  Subjective:    Patient ID: Kevin Downard., male    DOB: 1951/04/23, 67 y.o.   MRN: [id number]  HPI: Kevin Klima. is a 67 y.o. male presenting on 04/26/2018 for Diabetes (Here for f/u.)   See recent pt email for details.  Increased stress with wife's recent lung cancer dx. She recently started chemo, starts radiation tomorrow. Wife is Therapist, sports. Since then, pt has noticed sugars have been trending up. Over last 2 weeks however, sugars have actually been improving. He regularly walks and swims - 1 hour 3 times a week. Watching diet - less carbs, more protein. Taking metformin 10173m bid as well as tradjenta 559mdaily. For a short time he increased metformin to TID dosing - advised against this. Checks sugars 3-4 times a day.  Lab Results  Component Value Date   HGBA1C 6.8 (A) 04/26/2018   Lab Results  Component Value Date   CREATININE 0.69 11/02/2017    More trouble sleeping with recent stress. Last filled 7.73m673mose #30 on 09/2017. Prior overused ambien.   Relevant past medical, surgical, family and social history reviewed and updated as indicated. Interim medical history since our last visit reviewed. Allergies and medications reviewed and updated. Outpatient Medications Prior to Visit  Medication Sig Dispense Refill  . Blood Glucose Monitoring Suppl (ONE TOUCH ULTRA MINI) w/Device KIT Use as directed 1 each 0  . glucose blood (ONE TOUCH ULTRA TEST) test strip Check three times daily and as needed E11.65 100 each 6  . ipratropium (ATROVENT) 0.03 % nasal spray Place 2 sprays into both nostrils 2 (two) times daily. Do not use for more than 5days. 30 mL 0  . levothyroxine (SYNTHROID, LEVOTHROID) 100 MCG tablet Take 1 tablet (100 mcg total) by mouth daily  before breakfast. 90 tablet 3  . linagliptin (TRADJENTA) 5 MG TABS tablet Take 1 tablet (5 mg total) by mouth daily. 30 tablet 11  . metFORMIN (GLUCOPHAGE) 1000 MG tablet Take 1 tablet (1,000 mg total) by mouth 2 (two) times daily with a meal. 180 tablet 2  . Multiple Vitamins-Minerals (MULTIVITAMIN ADULTS 50+ PO) Take 1 tablet by mouth daily.    . Omega-3 Fatty Acids (FISH OIL) 1000 MG CAPS Take 1 capsule by mouth daily.    . sildenafil (REVATIO) 20 MG tablet TAKE 2-5 TABLETS BY MOUTH DAILY AS NEEDED 30 tablet 2  . sodium chloride (OCEAN) 0.65 % SOLN nasal spray Place 1 spray into both nostrils as needed for congestion. 15 mL 0  . ibuprofen (IBU) 800 MG tablet Take 1 tablet (800 mg total) by mouth 2 (two) times daily as needed. 50 tablet 3  . temazepam (RESTORIL) 7.5 MG capsule TAKE ONE CAPSULE BY MOUTH AT BEDTIME AS NEEDED FOR SLEEP. USE SPARINGLY!! 30 capsule 0  . sildenafil (REVATIO) 20 MG tablet Take 2-5 tablets (40-100 mg total) by mouth daily as needed (relations). 30 tablet 5   No facility-administered medications prior to visit.      Per HPI unless specifically indicated in ROS section below Review of Systems     Objective:    BP 118/70 (BP Location: Left Arm, Patient Position: Sitting, Cuff  Size: Normal)   Pulse (!) 54   Temp 98.2 F (36.8 C) (Oral)   Ht '5\' 5"'  (1.651 m)   Wt 176 lb 4 oz (79.9 kg)   SpO2 96%   BMI 29.33 kg/m   Wt Readings from Last 3 Encounters:  04/26/18 176 lb 4 oz (79.9 kg)  03/08/18 180 lb (81.6 kg)  11/02/17 184 lb (83.5 kg)    Physical Exam  Constitutional: He appears well-developed and well-nourished. No distress.  HENT:  Head: Normocephalic and atraumatic.  Right Ear: External ear normal.  Left Ear: External ear normal.  Nose: Nose normal.  Mouth/Throat: Oropharynx is clear and moist. No oropharyngeal exudate.  Eyes: Pupils are equal, round, and reactive to light. Conjunctivae and EOM are normal. No scleral icterus.  Neck: Normal range of  motion. Neck supple.  Cardiovascular: Normal rate, regular rhythm, normal heart sounds and intact distal pulses.  No murmur heard. Pulmonary/Chest: Effort normal and breath sounds normal. No respiratory distress. He has no wheezes. He has no rales.  Musculoskeletal: He exhibits no edema.  See HPI for foot exam if done  Lymphadenopathy:    He has no cervical adenopathy.  Skin: Skin is warm and dry. No rash noted.  Psychiatric: He has a normal mood and affect.  Nursing note and vitals reviewed.  Results for orders placed or performed in visit on 04/26/18  POCT glycosylated hemoglobin (Hb A1C)  Result Value Ref Range   Hemoglobin A1C 6.8 (A) 4.0 - 5.6 %   HbA1c POC (<> result, manual entry)     HbA1c, POC (prediabetic range)     HbA1c, POC (controlled diabetic range)        Assessment & Plan:   Problem List Items Addressed This Visit    Shift work sleep disorder    Recent stress of wife's illness exacerbating difficulty with sleep. Will increase restoril to 69m qhs prn insomnia. Discussed want to avoid ambien.       Diabetes mellitus type 2, uncontrolled, without complications ([facility]) - Primary    Chronic, stable. Improved based on A1c today. Pt monitoring diet, increasing exercise regimen. Sustainable changes. Congratulated. Continue current regimen. Discussed metformin dosing.       Relevant Orders   POCT glycosylated hemoglobin (Hb A1C) (Completed)       Meds ordered this encounter  Medications  . ibuprofen (IBU) 800 MG tablet    Sig: Take 1 tablet (800 mg total) by mouth 2 (two) times daily as needed.    Dispense:  50 tablet    Refill:  3  . temazepam (RESTORIL) 15 MG capsule    Sig: Take 1 capsule (15 mg total) by mouth at bedtime as needed for sleep.    Dispense:  30 capsule    Refill:  0   Orders Placed This Encounter  Procedures  . POCT glycosylated hemoglobin (Hb A1C)    Follow up plan: Return if symptoms worsen or fail to improve.  Kevin Bush MD

## 2018-04-29 ENCOUNTER — Encounter: Payer: Self-pay | Admitting: Family Medicine

## 2018-05-25 ENCOUNTER — Other Ambulatory Visit: Payer: Self-pay | Admitting: Family Medicine

## 2018-06-01 ENCOUNTER — Other Ambulatory Visit: Payer: Self-pay | Admitting: Family Medicine

## 2018-06-01 NOTE — Telephone Encounter (Signed)
Name of Medication: Temazepam Name of Pharmacy: [facility] or Written Date and Quantity: 04/26/18, #30 Last Office Visit and Type: 04/26/18, f/u Next Office Visit and Type: 06/28/18, CPE Last Controlled Substance Agreement Date: 05/06/16 Last UDS: 05/06/16

## 2018-06-04 NOTE — Telephone Encounter (Signed)
Eprescribed.

## 2018-06-19 ENCOUNTER — Other Ambulatory Visit: Payer: Self-pay | Admitting: Family Medicine

## 2018-06-24 ENCOUNTER — Other Ambulatory Visit: Payer: Self-pay | Admitting: Family Medicine

## 2018-06-24 DIAGNOSIS — E039 Hypothyroidism, unspecified: Secondary | ICD-10-CM

## 2018-06-24 DIAGNOSIS — IMO0001 Reserved for inherently not codable concepts without codable children: Secondary | ICD-10-CM

## 2018-06-24 DIAGNOSIS — E785 Hyperlipidemia, unspecified: Secondary | ICD-10-CM

## 2018-06-24 DIAGNOSIS — Z125 Encounter for screening for malignant neoplasm of prostate: Secondary | ICD-10-CM

## 2018-06-24 DIAGNOSIS — E1165 Type 2 diabetes mellitus with hyperglycemia: Principal | ICD-10-CM

## 2018-06-24 DIAGNOSIS — G6289 Other specified polyneuropathies: Secondary | ICD-10-CM

## 2018-06-25 ENCOUNTER — Other Ambulatory Visit: Payer: 59

## 2018-06-28 ENCOUNTER — Encounter: Payer: 59 | Admitting: Family Medicine

## 2018-07-07 ENCOUNTER — Other Ambulatory Visit: Payer: Self-pay | Admitting: Endocrinology

## 2018-07-08 ENCOUNTER — Encounter: Payer: Self-pay | Admitting: Family Medicine

## 2018-07-08 ENCOUNTER — Telehealth: Payer: Self-pay

## 2018-07-08 MED ORDER — LINAGLIPTIN 5 MG PO TABS: 5.0000 mg | ORAL_TABLET | Freq: Every day | ORAL | 1 refills | Status: DC

## 2018-07-08 NOTE — Telephone Encounter (Signed)
Received MyChart message from pt requesting refill for Tradjenta. [See Pt  Msg, 07/08/18.]  E-scribed refill. Notified pt via [facility].

## 2018-07-08 NOTE — Telephone Encounter (Signed)
E-scribed refill. Notified pt.  ?

## 2018-07-20 ENCOUNTER — Other Ambulatory Visit: Payer: Self-pay | Admitting: Family Medicine

## 2018-08-02 ENCOUNTER — Other Ambulatory Visit (INDEPENDENT_AMBULATORY_CARE_PROVIDER_SITE_OTHER): Payer: Medicare HMO

## 2018-08-02 DIAGNOSIS — E785 Hyperlipidemia, unspecified: Secondary | ICD-10-CM | POA: Diagnosis not present

## 2018-08-02 DIAGNOSIS — E039 Hypothyroidism, unspecified: Secondary | ICD-10-CM

## 2018-08-02 DIAGNOSIS — Z125 Encounter for screening for malignant neoplasm of prostate: Secondary | ICD-10-CM

## 2018-08-02 DIAGNOSIS — IMO0001 Reserved for inherently not codable concepts without codable children: Secondary | ICD-10-CM

## 2018-08-02 DIAGNOSIS — E1165 Type 2 diabetes mellitus with hyperglycemia: Secondary | ICD-10-CM

## 2018-08-02 LAB — LIPID PANEL
CHOLESTEROL: 218 mg/dL — AB (ref 0–200)
HDL: 48.1 mg/dL (ref >39.00)
LDL CALC: 139 mg/dL — AB (ref 0–99)
NonHDL: 169.75
TRIGLYCERIDES: 153 mg/dL — AB (ref 0.0–149.0)
Total CHOL/HDL Ratio: 5
VLDL: 30.6 mg/dL (ref 0.0–40.0)

## 2018-08-02 LAB — COMPREHENSIVE METABOLIC PANEL
ALK PHOS: 70 U/L (ref 39–117)
ALT: 43 U/L (ref 0–53)
AST: 33 U/L (ref 0–37)
Albumin: 4.9 g/dL (ref 3.5–5.2)
BILIRUBIN TOTAL: 0.6 mg/dL (ref 0.2–1.2)
BUN: 13 mg/dL (ref 6–23)
CALCIUM: 10 mg/dL (ref 8.4–10.5)
CO2: 26 mEq/L (ref 19–32)
Chloride: 103 mEq/L (ref 96–112)
Creatinine, Ser: 0.74 mg/dL (ref 0.40–1.50)
GFR: 112.41 mL/min (ref >60.00)
Glucose, Bld: 148 mg/dL — ABNORMAL HIGH (ref 70–99)
Potassium: 4.1 mEq/L (ref 3.5–5.1)
SODIUM: 139 meq/L (ref 135–145)
TOTAL PROTEIN: 7.3 g/dL (ref 6.0–8.3)

## 2018-08-02 LAB — MICROALBUMIN / CREATININE URINE RATIO
Creatinine,U: 217.6 mg/dL
MICROALB UR: 1.8 mg/dL (ref 0.0–1.9)
Microalb Creat Ratio: 0.8 mg/g (ref 0.0–30.0)

## 2018-08-02 LAB — TSH: TSH: 1.61 u[IU]/mL (ref 0.35–4.50)

## 2018-08-02 LAB — PSA: PSA: 0.43 ng/mL (ref 0.10–4.00)

## 2018-08-02 LAB — T4, FREE: Free T4: 1.29 ng/dL (ref 0.60–1.60)

## 2018-08-04 LAB — FRUCTOSAMINE: Fructosamine: 344 umol/L — ABNORMAL HIGH (ref 205–285)

## 2018-08-06 ENCOUNTER — Encounter: Payer: 59 | Admitting: Family Medicine

## 2018-08-09 ENCOUNTER — Encounter: Payer: Self-pay | Admitting: Family Medicine

## 2018-08-09 ENCOUNTER — Ambulatory Visit (INDEPENDENT_AMBULATORY_CARE_PROVIDER_SITE_OTHER): Payer: Medicare HMO | Admitting: Family Medicine

## 2018-08-09 VITALS — BP 118/70 | HR 66 | Temp 97.8°F | Ht 65.0 in | Wt 184.8 lb

## 2018-08-09 DIAGNOSIS — G4726 Circadian rhythm sleep disorder, shift work type: Secondary | ICD-10-CM

## 2018-08-09 DIAGNOSIS — N529 Male erectile dysfunction, unspecified: Secondary | ICD-10-CM

## 2018-08-09 DIAGNOSIS — M25511 Pain in right shoulder: Secondary | ICD-10-CM

## 2018-08-09 DIAGNOSIS — Z23 Encounter for immunization: Secondary | ICD-10-CM

## 2018-08-09 DIAGNOSIS — E1165 Type 2 diabetes mellitus with hyperglycemia: Secondary | ICD-10-CM | POA: Diagnosis not present

## 2018-08-09 DIAGNOSIS — E039 Hypothyroidism, unspecified: Secondary | ICD-10-CM

## 2018-08-09 DIAGNOSIS — Z1211 Encounter for screening for malignant neoplasm of colon: Secondary | ICD-10-CM

## 2018-08-09 DIAGNOSIS — E785 Hyperlipidemia, unspecified: Secondary | ICD-10-CM

## 2018-08-09 DIAGNOSIS — E66811 Obesity, class 1: Secondary | ICD-10-CM

## 2018-08-09 DIAGNOSIS — E669 Obesity, unspecified: Secondary | ICD-10-CM

## 2018-08-09 DIAGNOSIS — Z0000 Encounter for general adult medical examination without abnormal findings: Secondary | ICD-10-CM | POA: Diagnosis not present

## 2018-08-09 DIAGNOSIS — IMO0001 Reserved for inherently not codable concepts without codable children: Secondary | ICD-10-CM

## 2018-08-09 LAB — POCT GLYCOSYLATED HEMOGLOBIN (HGB A1C): Hemoglobin A1C: 7.9 % — AB (ref 4.0–5.6)

## 2018-08-09 MED ORDER — DICLOFENAC SODIUM 1 % TD GEL: 1.0000 "application " | Freq: Three times a day (TID) | TRANSDERMAL | 1 refills | Status: DC

## 2018-08-09 MED ORDER — SILDENAFIL CITRATE 20 MG PO TABS: ORAL_TABLET | ORAL | 6 refills | Status: DC

## 2018-08-09 MED ORDER — TEMAZEPAM 15 MG PO CAPS: ORAL_CAPSULE | ORAL | 0 refills | Status: DC

## 2018-08-09 MED ORDER — IBUPROFEN 800 MG PO TABS: 800.0000 mg | ORAL_TABLET | Freq: Two times a day (BID) | ORAL | 3 refills | Status: DC | PRN

## 2018-08-09 NOTE — Assessment & Plan Note (Signed)
Chronic, not at goal. Pt hesitant for statin use. Advised adherence to low chol diet. Reassess in 6 months The ASCVD Risk score Mikey Bussing DC Jr., et al., 2013) failed to calculate for the following reasons:   Unable to determine if patient is Non-Hispanic African American

## 2018-08-09 NOTE — Assessment & Plan Note (Signed)
Chronic, deteriorated based on recent fructosamine - will update A1c today. Reviewed importance of adherence to diabetic diet. RTC 3 mo f/u visit.

## 2018-08-09 NOTE — Assessment & Plan Note (Signed)
Encouraged healthy diet and lifestyle changes for goal weight loss.

## 2018-08-09 NOTE — Assessment & Plan Note (Signed)
Anticipate R shoulder strain - refill ibuprofen, will trial topical voltaren gel.

## 2018-08-09 NOTE — Assessment & Plan Note (Signed)
Continue PRN temazepam.

## 2018-08-09 NOTE — Assessment & Plan Note (Signed)
Generic sildenafil effective - refill today.

## 2018-08-09 NOTE — Assessment & Plan Note (Signed)
Chronic, stable. Continue current regimen. 

## 2018-08-09 NOTE — Assessment & Plan Note (Signed)
Preventative protocols reviewed and updated unless pt declined. Discussed healthy diet and lifestyle.  

## 2018-08-09 NOTE — Progress Notes (Signed)
BP 118/70 (BP Location: Left Arm, Patient Position: Sitting, Cuff Size: Normal)   Pulse 66   Temp 97.8 F (36.6 C) (Oral)   Wt 184 lb 12 oz (83.8 kg)   SpO2 95%   BMI 30.74 kg/m    CC: CPE Subjective:    Patient ID: Kevin Sheppard., male    DOB: 1950/11/18, 67 y.o.   MRN: [id number]  HPI: Kevin Sheppard. is a 67 y.o. male presenting on 08/09/2018 for Medicare Wellness   Only medicare part A.  Did not see Katha Cabal this year.  Some R shoulder pain after swimming at gym.   Preventative: Colonoscopy - benign polyps 2004, no polyps 2009 rpt 5 yrs (Dr Allen Norris in [place]). Normal iFOB 2018. Would like to return for colonoscopy.  Prostate cancer screening - discussed. Would like to continue.  Flu shot yearly at work Tdap 2011  Prevnar 05/2014. Pneumovax today shingrix - completed 2019 Seat belt use discussed Sunscreen use discussed. No changing moles.  Non smoker - wife smokes indoors. Works on [employer].  Alcohol - 3-4 beers/weekend  Dentist q6 mo  Eye exam yearly   He lives with wife. 2 dogs. They have 4 grown children and 3 grandchildren  He works on cigarette machines, 3rd shift  Activity: goes to gym regularly - staying active with cardio Diet: good water, fruits/vegetables daily   Relevant past medical, surgical, family and social history reviewed and updated as indicated. Interim medical history since our last visit reviewed. Allergies and medications reviewed and updated. Outpatient Medications Prior to Visit  Medication Sig Dispense Refill  . Blood Glucose Monitoring Suppl (ONE TOUCH ULTRA MINI) w/Device KIT Use as directed 1 each 0  . glucose blood (ONE TOUCH ULTRA TEST) test strip Check three times daily and as needed E11.65 100 each 6  . levothyroxine (SYNTHROID, LEVOTHROID) 100 MCG tablet TAKE ONE TABLET BY MOUTH EVERY MORNING BEFORE BREAKFAST 30 tablet 0  . linagliptin (TRADJENTA) 5 MG TABS tablet Take 1 tablet (5 mg total) by mouth daily. 30 tablet 1  .  metFORMIN (GLUCOPHAGE) 1000 MG tablet Take 1 tablet (1,000 mg total) by mouth 2 (two) times daily with a meal. 180 tablet 2  . Multiple Vitamins-Minerals (MULTIVITAMIN ADULTS 50+ PO) Take 1 tablet by mouth daily.    . Omega-3 Fatty Acids (FISH OIL) 1000 MG CAPS Take 1 capsule by mouth daily.    . sodium chloride (OCEAN) 0.65 % SOLN nasal spray Place 1 spray into both nostrils as needed for congestion. 15 mL 0  . ibuprofen (IBU) 800 MG tablet Take 1 tablet (800 mg total) by mouth 2 (two) times daily as needed. 50 tablet 3  . sildenafil (REVATIO) 20 MG tablet Take 2-5 tablets (40-100 mg total) by mouth daily as needed (relations). 30 tablet 5  . sildenafil (REVATIO) 20 MG tablet TAKE 2-5 TABLETS BY MOUTH DAILY AS NEEDED 30 tablet 0  . temazepam (RESTORIL) 15 MG capsule TAKE ONE CAPSULE BY MOUTH EVERY NIGHT AT BEDTIME AS NEEDED FOR SLEEP 30 capsule 0  . ipratropium (ATROVENT) 0.03 % nasal spray Place 2 sprays into both nostrils 2 (two) times daily. Do not use for more than 5days. 30 mL 0   No facility-administered medications prior to visit.      Per HPI unless specifically indicated in ROS section below Review of Systems  Constitutional: Negative for activity change, appetite change, chills, fatigue, fever and unexpected weight change.  HENT: Negative for hearing loss.   Eyes:  Negative for visual disturbance.  Respiratory: Negative for cough, chest tightness, shortness of breath and wheezing.   Cardiovascular: Negative for chest pain, palpitations and leg swelling.  Gastrointestinal: Negative for abdominal distention, abdominal pain, blood in stool, constipation, diarrhea, nausea and vomiting.  Genitourinary: Negative for difficulty urinating and hematuria.  Musculoskeletal: Negative for arthralgias, myalgias and neck pain.  Skin: Negative for rash.  Neurological: Negative for dizziness, seizures, syncope and headaches.  Hematological: Negative for adenopathy. Does not bruise/bleed easily.    Psychiatric/Behavioral: Negative for dysphoric mood. The patient is not nervous/anxious.        Objective:    BP 118/70 (BP Location: Left Arm, Patient Position: Sitting, Cuff Size: Normal)   Pulse 66   Temp 97.8 F (36.6 C) (Oral)   Wt 184 lb 12 oz (83.8 kg)   SpO2 95%   BMI 30.74 kg/m   Wt Readings from Last 3 Encounters:  08/09/18 184 lb 12 oz (83.8 kg)  04/26/18 176 lb 4 oz (79.9 kg)  03/08/18 180 lb (81.6 kg)    Physical Exam  Constitutional: He is oriented to person, place, and time. He appears well-developed and well-nourished. No distress.  HENT:  Head: Normocephalic and atraumatic.  Right Ear: Hearing, tympanic membrane, external ear and ear canal normal.  Left Ear: Hearing, tympanic membrane, external ear and ear canal normal.  Nose: Nose normal.  Mouth/Throat: Uvula is midline, oropharynx is clear and moist and mucous membranes are normal. No oropharyngeal exudate, posterior oropharyngeal edema or posterior oropharyngeal erythema.  Eyes: Pupils are equal, round, and reactive to light. Conjunctivae and EOM are normal. No scleral icterus.  Neck: Normal range of motion. Neck supple. Carotid bruit is not present. No thyromegaly present.  Cardiovascular: Normal rate, regular rhythm, normal heart sounds and intact distal pulses.  No murmur heard. Pulses:      Radial pulses are 2+ on the right side, and 2+ on the left side.  Pulmonary/Chest: Effort normal and breath sounds normal. No respiratory distress. He has no wheezes. He has no rales.  Abdominal: Soft. Bowel sounds are normal. He exhibits no distension and no mass. There is no tenderness. There is no rebound and no guarding.  Genitourinary: Rectum normal and prostate normal. Rectal exam shows no external hemorrhoid, no internal hemorrhoid, no fissure, no mass, no tenderness and anal tone normal. Prostate is not enlarged (20gm) and not tender.  Musculoskeletal: Normal range of motion. He exhibits no edema.  L shoulder  WNL R shoulder - no pain at bursae or shoulder landmarks, pain reproduced with abduction of arm above 90 degrees  Lymphadenopathy:    He has no cervical adenopathy.  Neurological: He is alert and oriented to person, place, and time.  CN grossly intact, station and gait intact  Skin: Skin is warm and dry. No rash noted.  Psychiatric: He has a normal mood and affect. His behavior is normal. Judgment and thought content normal.  Nursing note and vitals reviewed.  Results for orders placed or performed in visit on 08/09/18  POCT glycosylated hemoglobin (Hb A1C)  Result Value Ref Range   Hemoglobin A1C 7.9 (A) 4.0 - 5.6 %   HbA1c POC (<> result, manual entry)     HbA1c, POC (prediabetic range)     HbA1c, POC (controlled diabetic range)     Lab Results  Component Value Date   HGBA1C 7.9 (A) 08/09/2018       Assessment & Plan:   Problem List Items Addressed This Visit  Shift work sleep disorder    Continue PRN temazepam.       Right shoulder pain    Anticipate R shoulder strain - refill ibuprofen, will trial topical voltaren gel.      Obesity, Class I, BMI 30.0-34.9 (see actual BMI)    Encouraged healthy diet and lifestyle changes for goal weight loss.       Hypothyroidism    Chronic, stable. Continue current regimen.       HLD (hyperlipidemia)    Chronic, not at goal. Pt hesitant for statin use. Advised adherence to low chol diet. Reassess in 6 months The ASCVD Risk score Mikey Bussing DC Jr., et al., 2013) failed to calculate for the following reasons:   Unable to determine if patient is Non-Hispanic African American       Relevant Medications   sildenafil (REVATIO) 20 MG tablet   Health maintenance examination - Primary    Preventative protocols reviewed and updated unless pt declined. Discussed healthy diet and lifestyle.       Erectile dysfunction    Generic sildenafil effective - refill today.       Diabetes mellitus type 2, uncontrolled, without complications  (HCC)    Chronic, deteriorated based on recent fructosamine - will update A1c today. Reviewed importance of adherence to diabetic diet. RTC 3 mo f/u visit.       Relevant Orders   POCT glycosylated hemoglobin (Hb A1C) (Completed)    Other Visit Diagnoses    Special screening for malignant neoplasms, colon       Relevant Orders   Ambulatory referral to Gastroenterology   Need for 23-polyvalent pneumococcal polysaccharide vaccine       Relevant Orders   Pneumococcal polysaccharide vaccine 23-valent greater than or equal to 2yo subcutaneous/IM (Completed)       Meds ordered this encounter  Medications  . ibuprofen (IBU) 800 MG tablet    Sig: Take 1 tablet (800 mg total) by mouth 2 (two) times daily as needed.    Dispense:  50 tablet    Refill:  3  . temazepam (RESTORIL) 15 MG capsule    Sig: TAKE ONE CAPSULE BY MOUTH EVERY NIGHT AT BEDTIME AS NEEDED FOR SLEEP    Dispense:  30 capsule    Refill:  0  . sildenafil (REVATIO) 20 MG tablet    Sig: TAKE 2-5 TABLETS BY MOUTH DAILY AS NEEDED    Dispense:  30 tablet    Refill:  6  . diclofenac sodium (VOLTAREN) 1 % GEL    Sig: Apply 1 application topically 3 (three) times daily.    Dispense:  1 Tube    Refill:  1   Orders Placed This Encounter  Procedures  . Pneumococcal polysaccharide vaccine 23-valent greater than or equal to 2yo subcutaneous/IM  . Ambulatory referral to Gastroenterology    Referral Priority:   Routine    Referral Type:   Consultation    Referral Reason:   Specialty Services Required    Number of Visits Requested:   1  . POCT glycosylated hemoglobin (Hb A1C)    Follow up plan: Return in about 3 months (around 11/09/2018) for follow up visit.  Ria Bush, MD

## 2018-08-09 NOTE — Patient Instructions (Addendum)
Pneumovax today.  Try voltaren gel to right shoulder.  Medicines refilled. We will refer you for colonoscopy.  A1c today.  Return in 3 months for diabetes check  Health Maintenance, Male A healthy lifestyle and preventive care is important for your health and wellness. Ask your health care provider about what schedule of regular examinations is right for you. What should I know about weight and diet? Eat a Healthy Diet  Eat plenty of vegetables, fruits, whole grains, low-fat dairy products, and lean protein.  Do not eat a lot of foods high in solid fats, added sugars, or salt.  Maintain a Healthy Weight Regular exercise can help you achieve or maintain a healthy weight. You should:  Do at least 150 minutes of exercise each week. The exercise should increase your heart rate and make you sweat (moderate-intensity exercise).  Do strength-training exercises at least twice a week.  Watch Your Levels of Cholesterol and Blood Lipids  Have your blood tested for lipids and cholesterol every 5 years starting at 66 years of age. If you are at high risk for heart disease, you should start having your blood tested when you are 66 years old. You may need to have your cholesterol levels checked more often if: ? Your lipid or cholesterol levels are high. ? You are older than 66 years of age. ? You are at high risk for heart disease.  What should I know about cancer screening? Many types of cancers can be detected early and may often be prevented. Lung Cancer  You should be screened every year for lung cancer if: ? You are a current smoker who has smoked for at least 30 years. ? You are a former smoker who has quit within the past 15 years.  Talk to your health care provider about your screening options, when you should start screening, and how often you should be screened.  Colorectal Cancer  Routine colorectal cancer screening usually begins at 66 years of age and should be repeated every  5-10 years until you are 66 years old. You may need to be screened more often if early forms of precancerous polyps or small growths are found. Your health care provider may recommend screening at an earlier age if you have risk factors for colon cancer.  Your health care provider may recommend using home test kits to check for hidden blood in the stool.  A small camera at the end of a tube can be used to examine your colon (sigmoidoscopy or colonoscopy). This checks for the earliest forms of colorectal cancer.  Prostate and Testicular Cancer  Depending on your age and overall health, your health care provider may do certain tests to screen for prostate and testicular cancer.  Talk to your health care provider about any symptoms or concerns you have about testicular or prostate cancer.  Skin Cancer  Check your skin from head to toe regularly.  Tell your health care provider about any new moles or changes in moles, especially if: ? There is a change in a mole's size, shape, or color. ? You have a mole that is larger than a pencil eraser.  Always use sunscreen. Apply sunscreen liberally and repeat throughout the day.  Protect yourself by wearing long sleeves, pants, a wide-brimmed hat, and sunglasses when outside.  What should I know about heart disease, diabetes, and high blood pressure?  If you are 47-45 years of age, have your blood pressure checked every 3-5 years. If you are 40 years  of age or older, have your blood pressure checked every year. You should have your blood pressure measured twice-once when you are at a hospital or clinic, and once when you are not at a hospital or clinic. Record the average of the two measurements. To check your blood pressure when you are not at a hospital or clinic, you can use: ? An automated blood pressure machine at a pharmacy. ? A home blood pressure monitor.  Talk to your health care provider about your target blood pressure.  If you are  between 28-42 years old, ask your health care provider if you should take aspirin to prevent heart disease.  Have regular diabetes screenings by checking your fasting blood sugar level. ? If you are at a normal weight and have a low risk for diabetes, have this test once every three years after the age of 51. ? If you are overweight and have a high risk for diabetes, consider being tested at a younger age or more often.  A one-time screening for abdominal aortic aneurysm (AAA) by ultrasound is recommended for men aged 81-75 years who are current or former smokers. What should I know about preventing infection? Hepatitis B If you have a higher risk for hepatitis B, you should be screened for this virus. Talk with your health care provider to find out if you are at risk for hepatitis B infection. Hepatitis C Blood testing is recommended for:  Everyone born from [birth year] through 1964.  Anyone with known risk factors for hepatitis C.  Sexually Transmitted Diseases (STDs)  You should be screened each year for STDs including gonorrhea and chlamydia if: ? You are sexually active and are younger than 66 years of age. ? You are older than 66 years of age and your health care provider tells you that you are at risk for this type of infection. ? Your sexual activity has changed since you were last screened and you are at an increased risk for chlamydia or gonorrhea. Ask your health care provider if you are at risk.  Talk with your health care provider about whether you are at high risk of being infected with HIV. Your health care provider may recommend a prescription medicine to help prevent HIV infection.  What else can I do?  Schedule regular health, dental, and eye exams.  Stay current with your vaccines (immunizations).  Do not use any tobacco products, such as cigarettes, chewing tobacco, and e-cigarettes. If you need help quitting, ask your health care provider.  Limit alcohol intake to no  more than 2 drinks per day. One drink equals 12 ounces of beer, 5 ounces of wine, or 1 ounces of hard liquor.  Do not use street drugs.  Do not share needles.  Ask your health care provider for help if you need support or information about quitting drugs.  Tell your health care provider if you often feel depressed.  Tell your health care provider if you have ever been abused or do not feel safe at home. This information is not intended to replace advice given to you by your health care provider. Make sure you discuss any questions you have with your health care provider. Document Released: 02/28/2008 Document Revised: 04/30/2016 Document Reviewed: 06/05/2015 Elsevier Interactive Patient Education  [employer].

## 2018-08-11 ENCOUNTER — Other Ambulatory Visit: Payer: Self-pay

## 2018-08-11 DIAGNOSIS — Z1211 Encounter for screening for malignant neoplasm of colon: Secondary | ICD-10-CM

## 2018-08-17 ENCOUNTER — Other Ambulatory Visit: Payer: Self-pay | Admitting: Family Medicine

## 2018-08-18 ENCOUNTER — Encounter: Payer: Self-pay | Admitting: Family Medicine

## 2018-08-23 ENCOUNTER — Encounter: Payer: Self-pay | Admitting: *Deleted

## 2018-08-23 ENCOUNTER — Other Ambulatory Visit: Payer: Self-pay

## 2018-08-23 MED ORDER — ATORVASTATIN CALCIUM 20 MG PO TABS: 20.0000 mg | ORAL_TABLET | Freq: Every day | ORAL | 11 refills | Status: DC

## 2018-08-26 NOTE — Anesthesia Preprocedure Evaluation (Addendum)
Anesthesia Evaluation  Patient identified by MRN, date of birth, ID band Patient awake    Reviewed: Allergy & Precautions, NPO status , Patient's Chart, lab work & pertinent test results  History of Anesthesia Complications Negative for: history of anesthetic complications  Airway Mallampati: I   Neck ROM: Full    Dental  (+)    Pulmonary neg pulmonary ROS,    Pulmonary exam normal breath sounds clear to auscultation       Cardiovascular Exercise Tolerance: Good negative cardio ROS Normal cardiovascular exam Rhythm:Regular Rate:Normal     Neuro/Psych  Neuromuscular disease (neuropathy)    GI/Hepatic negative GI ROS,   Endo/Other  diabetes, Type 2Hypothyroidism   Renal/GU negative Renal ROS     Musculoskeletal   Abdominal   Peds  Hematology negative hematology ROS (+)   Anesthesia Other Findings   Reproductive/Obstetrics                            Anesthesia Physical Anesthesia Plan  ASA: II  Anesthesia Plan: General   Post-op Pain Management:    Induction: Intravenous  PONV Risk Score and Plan: 2 and Propofol infusion and TIVA  Airway Management Planned: Natural Airway  Additional Equipment:   Intra-op Plan:   Post-operative Plan:   Informed Consent: I have reviewed the patients History and Physical, chart, labs and discussed the procedure including the risks, benefits and alternatives for the proposed anesthesia with the patient or authorized representative who has indicated his/her understanding and acceptance.     Plan Discussed with: CRNA  Anesthesia Plan Comments:        Anesthesia Quick Evaluation

## 2018-08-27 NOTE — Discharge Instructions (Signed)
General Anesthesia, Adult, Care After °These instructions provide you with information about caring for yourself after your procedure. Your health care provider may also give you more specific instructions. Your treatment has been planned according to current medical practices, but problems sometimes occur. Call your health care provider if you have any problems or questions after your procedure. °What can I expect after the procedure? °After the procedure, it is common to have: °· Vomiting. °· A sore throat. °· Mental slowness. ° °It is common to feel: °· Nauseous. °· Cold or shivery. °· Sleepy. °· Tired. °· Sore or achy, even in parts of your body where you did not have surgery. ° °Follow these instructions at home: °For at least 24 hours after the procedure: °· Do not: °? Participate in activities where you could fall or become injured. °? Drive. °? Use heavy machinery. °? Drink alcohol. °? Take sleeping pills or medicines that cause drowsiness. °? Make important decisions or sign legal documents. °? Take care of children on your own. °· Rest. °Eating and drinking °· If you vomit, drink water, juice, or soup when you can drink without vomiting. °· Drink enough fluid to keep your urine clear or pale yellow. °· Make sure you have little or no nausea before eating solid foods. °· Follow the diet recommended by your health care provider. °General instructions °· Have a responsible adult stay with you until you are awake and alert. °· Return to your normal activities as told by your health care provider. Ask your health care provider what activities are safe for you. °· Take over-the-counter and prescription medicines only as told by your health care provider. °· If you smoke, do not smoke without supervision. °· Keep all follow-up visits as told by your health care provider. This is important. °Contact a health care provider if: °· You continue to have nausea or vomiting at home, and medicines are not helpful. °· You  cannot drink fluids or start eating again. °· You cannot urinate after 8-12 hours. °· You develop a skin rash. °· You have fever. °· You have increasing redness at the site of your procedure. °Get help right away if: °· You have difficulty breathing. °· You have chest pain. °· You have unexpected bleeding. °· You feel that you are having a life-threatening or urgent problem. °This information is not intended to replace advice given to you by your health care provider. Make sure you discuss any questions you have with your health care provider. °Document Released: 12/08/2000 Document Revised: 02/04/2016 Document Reviewed: 08/16/2015 °Elsevier Interactive Patient Education © 2018 [employer] ° °

## 2018-08-30 ENCOUNTER — Ambulatory Visit
Admission: RE | Admit: 2018-08-30 | Discharge: 2018-08-30 | Disposition: A | Discharge status: Home / Self Care | Payer: 59 | Attending: Gastroenterology | Admitting: Gastroenterology

## 2018-08-30 ENCOUNTER — Ambulatory Visit: Payer: 59 | Admitting: Anesthesiology

## 2018-08-30 ENCOUNTER — Encounter
Admission: RE | Disposition: A | Discharge status: Home / Self Care | Payer: Self-pay | Source: Home / Self Care | Attending: Gastroenterology

## 2018-08-30 DIAGNOSIS — D124 Benign neoplasm of descending colon: Secondary | ICD-10-CM

## 2018-08-30 DIAGNOSIS — Z860100 Personal history of colon polyps, unspecified: Secondary | ICD-10-CM

## 2018-08-30 DIAGNOSIS — D120 Benign neoplasm of cecum: Secondary | ICD-10-CM

## 2018-08-30 DIAGNOSIS — Z7984 Long term (current) use of oral hypoglycemic drugs: Secondary | ICD-10-CM | POA: Diagnosis not present

## 2018-08-30 DIAGNOSIS — Z79899 Other long term (current) drug therapy: Secondary | ICD-10-CM | POA: Diagnosis not present

## 2018-08-30 DIAGNOSIS — D125 Benign neoplasm of sigmoid colon: Secondary | ICD-10-CM | POA: Insufficient documentation

## 2018-08-30 DIAGNOSIS — E039 Hypothyroidism, unspecified: Secondary | ICD-10-CM | POA: Diagnosis not present

## 2018-08-30 DIAGNOSIS — K5730 Diverticulosis of large intestine without perforation or abscess without bleeding: Secondary | ICD-10-CM | POA: Diagnosis not present

## 2018-08-30 DIAGNOSIS — Z79890 Hormone replacement therapy (postmenopausal): Secondary | ICD-10-CM | POA: Insufficient documentation

## 2018-08-30 DIAGNOSIS — Z791 Long term (current) use of non-steroidal anti-inflammatories (NSAID): Secondary | ICD-10-CM | POA: Diagnosis not present

## 2018-08-30 DIAGNOSIS — G629 Polyneuropathy, unspecified: Secondary | ICD-10-CM | POA: Diagnosis not present

## 2018-08-30 DIAGNOSIS — Z1211 Encounter for screening for malignant neoplasm of colon: Secondary | ICD-10-CM | POA: Diagnosis not present

## 2018-08-30 DIAGNOSIS — E119 Type 2 diabetes mellitus without complications: Secondary | ICD-10-CM | POA: Insufficient documentation

## 2018-08-30 DIAGNOSIS — K635 Polyp of colon: Secondary | ICD-10-CM

## 2018-08-30 DIAGNOSIS — E785 Hyperlipidemia, unspecified: Secondary | ICD-10-CM | POA: Diagnosis not present

## 2018-08-30 HISTORY — PX: COLONOSCOPY WITH PROPOFOL: SHX5780

## 2018-08-30 HISTORY — PX: POLYPECTOMY: SHX5525

## 2018-08-30 LAB — GLUCOSE, CAPILLARY: Glucose-Capillary: 168 mg/dL — ABNORMAL HIGH (ref 70–99)

## 2018-08-30 SURGERY — COLONOSCOPY WITH PROPOFOL
Anesthesia: General | Site: Rectum

## 2018-08-30 MED ORDER — LIDOCAINE HCL (CARDIAC) PF 100 MG/5ML IV SOSY
PREFILLED_SYRINGE | INTRAVENOUS | Status: DC | PRN
  Administered 2018-08-30: 30 mg via INTRAVENOUS

## 2018-08-30 MED ORDER — LACTATED RINGERS IV SOLN
1000.0000 mL | INTRAVENOUS | Status: DC
  Administered 2018-08-30: 1000 mL via INTRAVENOUS

## 2018-08-30 MED ORDER — ACETAMINOPHEN 325 MG PO TABS: 650.0000 mg | ORAL_TABLET | Freq: Once | ORAL | Status: DC | PRN

## 2018-08-30 MED ORDER — PROPOFOL 10 MG/ML IV BOLUS
INTRAVENOUS | Status: DC | PRN
  Administered 2018-08-30: 40 mg via INTRAVENOUS
  Administered 2018-08-30: 50 mg via INTRAVENOUS
  Administered 2018-08-30: 100 mg via INTRAVENOUS
  Administered 2018-08-30: 50 mg via INTRAVENOUS

## 2018-08-30 MED ORDER — ONDANSETRON HCL 4 MG/2ML IJ SOLN: 4.0000 mg | Freq: Once | INTRAMUSCULAR | Status: DC | PRN

## 2018-08-30 MED ORDER — STERILE WATER FOR IRRIGATION IR SOLN
Status: DC | PRN
  Administered 2018-08-30: 10:00:00

## 2018-08-30 MED ORDER — ACETAMINOPHEN 160 MG/5ML PO SOLN: 325.0000 mg | ORAL | Status: DC | PRN

## 2018-08-30 SURGICAL SUPPLY — 6 items
CANISTER SUCT 1200ML W/VALVE (MISCELLANEOUS) ×3 IMPLANT
FORCEPS BIOP RAD 4 LRG CAP 4 (CUTTING FORCEPS) ×3 IMPLANT
GOWN CVR UNV OPN BCK APRN NK (MISCELLANEOUS) ×2 IMPLANT
GOWN ISOL THUMB LOOP REG UNIV (MISCELLANEOUS) ×4
KIT ENDO PROCEDURE OLY (KITS) ×3 IMPLANT
WATER STERILE IRR 250ML POUR (IV SOLUTION) ×3 IMPLANT

## 2018-08-30 NOTE — Anesthesia Procedure Notes (Signed)
Performed by: Tramond Slinker, CRNA Pre-anesthesia Checklist: Patient identified, Emergency Drugs available, Suction available, Timeout performed and Patient being monitored Patient Re-evaluated:Patient Re-evaluated prior to induction Oxygen Delivery Method: Nasal cannula Placement Confirmation: positive ETCO2       

## 2018-08-30 NOTE — Transfer of Care (Signed)
Immediate Anesthesia Transfer of Care Note  Patient: Kevin Sheppard.  Procedure(s) Performed: COLONOSCOPY WITH BIOPSIES (N/A Rectum) POLYPECTOMY (N/A Rectum)  Patient Location: PACU  Anesthesia Type: General  Level of Consciousness: awake, alert  and patient cooperative  Airway and Oxygen Therapy: Patient Spontanous Breathing and Patient connected to supplemental oxygen  Post-op Assessment: Post-op Vital signs reviewed, Patient's Cardiovascular Status Stable, Respiratory Function Stable, Patent Airway and No signs of Nausea or vomiting  Post-op Vital Signs: Reviewed and stable  Complications: No apparent anesthesia complications

## 2018-08-30 NOTE — Op Note (Signed)
[facility] Gastroenterology Patient Name: Kevin Sheppard Procedure Date: 08/30/2018 9:16 AM MRN: [id number] Account #: [id number] Date of Birth: 22-Dec-1950 Admit Type: Outpatient Age: 67 Room: [facility] OR ROOM 01 Gender: Male Note Status: Finalized Procedure:            Colonoscopy Indications:          High risk colon cancer surveillance: Personal history                        of colonic polyps in 2004 Providers:            Lucilla Lame MD, MD Referring MD:         Ria Bush (Referring MD) Medicines:            Propofol per Anesthesia Complications:        No immediate complications. Procedure:            Pre-Anesthesia Assessment:                       - Prior to the procedure, a History and Physical was                        performed, and patient medications and allergies were                        reviewed. The patient's tolerance of previous                        anesthesia was also reviewed. The risks and benefits of                        the procedure and the sedation options and risks were                        discussed with the patient. All questions were                        answered, and informed consent was obtained. Prior                        Anticoagulants: The patient has taken no previous                        anticoagulant or antiplatelet agents. ASA Grade                        Assessment: II - A patient with mild systemic disease.                        After reviewing the risks and benefits, the patient was                        deemed in satisfactory condition to undergo the                        procedure.                       After obtaining informed consent, the colonoscope was  passed under direct vision. Throughout the procedure,                        the patient's blood pressure, pulse, and oxygen                        saturations were monitored continuously. The was                         introduced through the anus and advanced to the the                        cecum, identified by appendiceal orifice and ileocecal                        valve. The colonoscopy was performed without                        difficulty. The patient tolerated the procedure well.                        The quality of the bowel preparation was excellent. Findings:      The perianal and digital rectal examinations were normal.      A 2 mm polyp was found in the cecum. The polyp was sessile. The polyp       was removed with a cold biopsy forceps. Resection and retrieval were       complete.      A 3 mm polyp was found in the descending colon. The polyp was sessile.       The polyp was removed with a cold biopsy forceps. Resection and       retrieval were complete.      A 3 mm polyp was found in the sigmoid colon. The polyp was sessile. The       polyp was removed with a cold biopsy forceps. Resection and retrieval       were complete.      Multiple small-mouthed diverticula were found in the sigmoid colon. Impression:           - One 2 mm polyp in the cecum, removed with a cold                        biopsy forceps. Resected and retrieved.                       - One 3 mm polyp in the descending colon, removed with                        a cold biopsy forceps. Resected and retrieved.                       - One 3 mm polyp in the sigmoid colon, removed with a                        cold biopsy forceps. Resected and retrieved.                       - Diverticulosis in the sigmoid colon. Recommendation:       - Discharge patient to home.                       -  Resume previous diet.                       - Continue present medications.                       - Await pathology results.                       - Repeat colonoscopy in 5 years for surveillance. Lucilla Lame MD, MD 08/30/2018 9:45:26 AM This report has been signed electronically. Number of Addenda: 0 Note Initiated On: 08/30/2018 9:16  AM Scope Withdrawal Time: 0 hours 11 minutes 11 seconds  Total Procedure Duration: 0 hours 13 minutes 8 seconds       [facility]

## 2018-08-30 NOTE — Anesthesia Postprocedure Evaluation (Signed)
Anesthesia Post Note  Patient: Kevin Sheppard.  Procedure(s) Performed: COLONOSCOPY WITH BIOPSIES (N/A Rectum) POLYPECTOMY (N/A Rectum)  Patient location during evaluation: PACU Anesthesia Type: General Level of consciousness: awake and alert, oriented and patient cooperative Pain management: pain level controlled Vital Signs Assessment: post-procedure vital signs reviewed and stable Respiratory status: spontaneous breathing, nonlabored ventilation and respiratory function stable Cardiovascular status: blood pressure returned to baseline and stable Postop Assessment: adequate PO intake Anesthetic complications: no    Darrin Nipper

## 2018-08-30 NOTE — H&P (Signed)
Kevin Lame, MD [facility] [address] [place], [place] [address] [phone] Fax : [phone]  Primary Care Physician:  Ria Bush, MD Primary Gastroenterologist:  Dr. Allen Norris  Pre-Procedure History & Physical: HPI:  Kevin Kirtz. is a 67 y.o. male is here for an colonoscopy.   Past Medical History:  Diagnosis Date  . Abnormal drug screen 8/2-05/2015   +MJ pt states one time thing (04/2015)  . Diabetes type 2, controlled ([place])   . History of chicken pox   . History of colon polyps    benign  . HLD (hyperlipidemia)   . Hypothyroid   . 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 COLONOSCOPY  2004;2009   first with b9 polyps, then diverticulosis no polyps, rpt 43yr ([facility])  . MENISCUS REPAIR Bilateral 2002;2008    Prior to Admission medications   Medication Sig Start Date End Date Taking? Authorizing Provider  atorvastatin (LIPITOR) 20 MG tablet Take 1 tablet (20 mg total) by mouth daily. 08/23/18  Yes GRia Bush MD  Blood Glucose Monitoring Suppl (ONE TOUCH ULTRA MINI) w/Device KIT Use as directed 06/01/17  Yes GRia Bush MD  diclofenac sodium (VOLTAREN) 1 % GEL Apply 1 application topically 3 (three) times daily. 08/09/18  Yes GRia Bush MD  glucose blood (ONE TOUCH ULTRA TEST) test strip Check three times daily and as needed E11.65 07/03/17  Yes GRia Bush MD  ibuprofen (IBU) 800 MG tablet Take 1 tablet (800 mg total) by mouth 2 (two) times daily as needed. 08/09/18  Yes GRia Bush MD  levothyroxine (SYNTHROID, LEVOTHROID) 100 MCG tablet TAKE ONE TABLET BY MOUTH EVERY MORNING BEFORE BREAKFAST 08/17/18  Yes GRia Bush MD  linagliptin (TRADJENTA) 5 MG TABS tablet Take 1 tablet (5 mg total) by mouth daily. 07/08/18  Yes GRia Bush MD  metFORMIN (GLUCOPHAGE) 1000 MG  tablet Take 1 tablet (1,000 mg total) by mouth 2 (two) times daily with a meal. 04/23/18  Yes GRia Bush MD  Multiple Vitamins-Minerals (MULTIVITAMIN ADULTS 50+ PO) Take 1 tablet by mouth daily.   Yes [provider]  sildenafil (REVATIO) 20 MG tablet TAKE 2-5 TABLETS BY MOUTH DAILY AS NEEDED 08/09/18  Yes GRia Bush MD  temazepam (RESTORIL) 15 MG capsule TAKE ONE CAPSULE BY MOUTH EVERY NIGHT AT BEDTIME AS NEEDED FOR SLEEP 08/09/18  Yes GRia Bush MD  Omega-3 Fatty Acids (FISH OIL) 1000 MG CAPS Take 1 capsule by mouth daily.    [provider]    Allergies as of 08/11/2018 - Review Complete 08/09/2018  Allergen Reaction Noted  . Lyrica [pregabalin] Rash 08/16/2014    Family History  Problem Relation Age of Onset  . Stroke Father   . Diabetes Father   . Other Mother        blood disease, died 349(?porphyria)  . Diabetes type II Brother   . Diabetes type II Maternal Aunt   . Hypertension Sister   . Stroke Sister 559 . Thyroid disease Sister   . Cancer Neg Hx   . CAD Neg Hx     Social History   Socioeconomic History  . Marital status: Married    Spouse name: Not on file  . Number of children: 4  . Years of education: Not on file  . Highest education level: Not on file  Occupational History  . Occupation:  line adjuster  Social Needs  . Financial resource strain: Not on file  . Food insecurity:    Worry: Not on file    Inability: Not on file  . Transportation needs:    Medical: Not on file    Non-medical: Not on file  Tobacco Use  . Smoking status: Never Smoker  . Smokeless tobacco: Never Used  Substance and Sexual Activity  . Alcohol use: Yes    Alcohol/week: 6.0 standard drinks    Types: 6 Cans of beer per week    Comment:    . Drug use: No  . Sexual activity: Not on file  Lifestyle  . Physical activity:    Days per week: Not on file    Minutes per session: Not on file  . Stress: Not on file  Relationships  . Social  connections:    Talks on phone: Not on file    Gets together: Not on file    Attends religious service: Not on file    Active member of club or organization: Not on file    Attends meetings of clubs or organizations: Not on file    Relationship status: Not on file  . Intimate partner violence:    Fear of current or ex partner: Not on file    Emotionally abused: Not on file    Physically abused: Not on file    Forced sexual activity: Not on file  Other Topics Concern  . Not on file  Social History Narrative   He lives with wife.  2 dogs. They have grown 4 children and 3 grandchildren.    He works on cigarette machines, 3rd shift   Activity: goes to gym regularly   Diet: good water, fruits/vegetables daily    Review of Systems: See HPI, otherwise negative ROS  Physical Exam: BP 113/74   Pulse 60   Temp 97.9 F (36.6 C) (Temporal)   Resp 15   Ht _0  (1.651 m)   Wt 81.2 kg   SpO2 98%   BMI 29.79 kg/m  General:   Alert,  pleasant and cooperative in NAD Head:  Normocephalic and atraumatic. Neck:  Supple; no masses or thyromegaly. Lungs:  Clear throughout to auscultation.    Heart:  Regular rate and rhythm. Abdomen:  Soft, nontender and nondistended. Normal bowel sounds, without guarding, and without rebound.   Neurologic:  Alert and  oriented x4;  grossly normal neurologically.  Impression/Plan: Kevin Sheppard. is here for an colonoscopy to be performed for history of colon polyps 2004  Risks, benefits, limitations, and alternatives regarding  colonoscopy have been reviewed with the patient.  Questions have been answered.  All parties agreeable.   Kevin Lame, MD  08/30/2018, 9:16 AM

## 2018-08-31 ENCOUNTER — Encounter: Payer: Self-pay | Admitting: Gastroenterology

## 2018-09-04 ENCOUNTER — Other Ambulatory Visit: Payer: Self-pay | Admitting: Family Medicine

## 2018-10-06 LAB — HM DIABETES EYE EXAM

## 2018-10-07 ENCOUNTER — Encounter: Payer: Self-pay | Admitting: Family Medicine

## 2018-10-07 ENCOUNTER — Telehealth: Payer: Self-pay

## 2018-10-07 NOTE — Telephone Encounter (Signed)
Submitted PA for Trdjenta 5 mg tab, key:  [id number], Rx #:  [id number].  Decision pending.

## 2018-10-08 ENCOUNTER — Encounter: Payer: Self-pay | Admitting: Family Medicine

## 2018-10-12 ENCOUNTER — Encounter: Payer: Self-pay | Admitting: Family Medicine

## 2018-10-12 NOTE — Telephone Encounter (Signed)
Name of Medication: Temazepam Name of [facility]acy: [facility] or Written Date and Quantity: 08/09/18, #30/0 Last Office Visit and Type: 08/09/18, CPE Next Office Visit and Type: 11/08/18, 3 mo DM f/u Last Controlled Substance Agreement Date: 05/06/16 Last UDS: 05/06/16

## 2018-10-13 MED ORDER — TEMAZEPAM 15 MG PO CAPS: ORAL_CAPSULE | ORAL | 0 refills | Status: DC

## 2018-10-13 NOTE — Telephone Encounter (Signed)
E perscribed 

## 2018-10-19 NOTE — Telephone Encounter (Addendum)
Received faxed PA approval valid 10/11/2018- 10/11/2019.    Spoke with [place] informing them of approval.

## 2018-10-21 ENCOUNTER — Encounter: Payer: 59 | Admitting: *Deleted

## 2018-10-21 VITALS — BP 138/82 | HR 58 | Ht 65.0 in | Wt 177.0 lb

## 2018-10-21 DIAGNOSIS — Z006 Encounter for examination for normal comparison and control in clinical research program: Secondary | ICD-10-CM

## 2018-10-21 NOTE — Research (Signed)
Subject met inclusion and exclusion criteria.  The informed consent form, study requirements and expectations were reviewed with the subject and questions and concerns were addressed prior to the signing of the consent form.  The subject verbalized understanding of the trial requirements.  The subject agreed to participate in the [facility] trial and signed the informed consent.  The informed consent was obtained prior to performance of any protocol-specific procedures for the subject.  A copy of the signed informed consent was given to the subject and a copy was placed in the subject's medical record.   Eligibility Criteria Worksheet    AMG [id number]              Site No. [id number]                      Subject ID [id number]     Code Inclusion Criteria NOTE:The subject is not eligible for the study if any criterion is checked NO  No Yes  These inclusion criteria are to be assessed during the screening period prior to enrollment into the baseline period:  101 Subject has provided informed consent prior to initiation of any study specific activities/procedures '[]'  '[x]'   102 Adult subjects ? 1 years (men) or ? 68 years (women) to < 18 years of age (either sex)  '[]'  '[x]'   103 Subjects must have an LDL-C ? 100 mg/dL (? 2.6 mmol/L) or non-HDL-C ? 130 mg/dL (? 3.4 mmol/L) at screening, after ? 4 weeks of optimized lipid-lowering therapy (see Section 12.8 of the protocol).  '[]'  '[x]'   104 Diagnostic evidence of at least 1 of the following (A - D) at screening:  '[]'  A. Significant coronary artery disease meeting at least 1 of the following criteria:       History of coronary revascularization with multi-vessel coronary       disease as evidenced by any of the following:               '[]'  (a) multi-vessel percutaneous coronary intervention (PCI)               '[]'  (b) PCI or coronary artery bypass grafting (CABG) with                 residual ?50% stenosis in a separate, unrevascularized                 segment or  vessel, or               '[]'  (c) multi-vessel CABG at least 5 years prior to screening       '[]'  Significant coronary disease without prior revascularization as evidenced by either a ? 70% stenosis of at least 1 coronary artery, ? 50% stenosis of 2 or more coronary arteries, or ? 50% stenosis of the left main coronary artery       '[]'  known coronary artery calcium score ? 100  '[]'  B. Significant atherosclerotic cerebrovascular disease meeting at least 1 of the following criteria:       '[]'  prior transient ischemic attack with ? 50% carotid stenosis       '[]'  carotid artery stenosis of ? 70% or 2 or more ? 50% stenosis       '[]'  prior carotid artery revascularization  '[]'  C. Significant peripheral arterial disease meeting at least 1 of the          following criteria:       '[]'  ? 50% stenosis  in a limb artery       '[]'  history of abdominal aorta treatment (percutaneous and surgical)       '[]'  ankle brachial index (ABI) < 0.85  '[x]'  D. Diabetes mellitus with at least 1 of the following:       '[]'  known microvascular disease, defined by diabetic nephropathy or treated retinopathy. Diabetic nephropathy defined as microalbuminuria (urinary albumin to creatinine ratio ? 85m/g) and/or estimated glomerular filtration rate (eGFR) < 60 mL/min/1.73 m2       '[]'  chronic treatment with insulin       '[x]'  diabetes diagnosis ? 10 years ago  '[]'   '[]'   105 At least 1 of the following high risk criteria at screening (most recent lab values prior to screening, as applicable):  '[]'  polyvascular disease, defined as coronary, carotid, or peripheral artery stenosis ? 50% in a second distinct vascular location in a patient with coronary, cerebral or peripheral arterial disease (inclusion criterion 104 A-C)  '[x]'  diabetes or known evidence of metabolic syndrome (Section 12.9 of the protocol) in a subject with coronary, cerebral, or peripheral artery disease (inclusion criterion 104 A-C)  '[]'  at least 1 coronary, carotid, or peripheral  artery stenosis of ? 50% in a patient with diabetes meeting inclusion criterion 104 D  '[]'  LDL ? 130 mg/dL (? 3.4 mmol/L) or non-HDL ? 160 mg/dL (> 4.2 mmol/L)  '[]'  lipoprotein (a) > 125 nmol/L (50 mg/dL)  '[]'  known familial hypercholesterolemia  '[]'  family history of premature coronary artery disease defined as an MI or CABG in the subject's father or brother at age < 540years or an MI or CABG in the subject's mother or sister at age < 621years  '[]'  high sensitive c-reactive protein ? 3.0 mg/L  '[]'  current tobacco use  '[]'  ? 68years of age  '[]'  menopause before 68years of age  '[]'  eGFR 15 to < 45 mL/min/1.73 m2  '[]'  '[]'   106 FOR FIranONLY: Subject affiliated to a social security scheme '[]'  '[]'   Exclusion Criteria NOTE: The subject is not eligible for the study if any criterion is checked YES.  Disease Related   201 MI or stroke prior to randomization.  '[x]'  '[]'   202 CABG < 3 months prior to screening  '[x]'  '[]'   203 Uncontrolled or recurrent ventricular tachycardia  '[x]'  '[]'   204 Atrial fibrillation not on anticoagulation therapy  '[x]'  '[]'   205 Uncontrolled hypertension (sitting systolic blood pressure > 1683mmHg or diastolic blood pressure > 110 mmHg) at screening.  '[x]'  '[]'   206 Last measured left-ventricular ejection fraction < 30% or New York Heart Association (NYHA) Functional Class III/IV.  '[x]'  '[]'   Diagnostic Assessments  207 Fasting triglycerides ? 500 mg/dL (5.7 mmol/L) at screening.  '[x]'  '[]'   208 End stage renal disease (ESRD), defined as an eGFR < 15 mL/min/1.73 m2 or receiving dialysis at screening.  '[x]'  '[]'   Other Medical Conditions  209 Malignancy, except non-melanoma skin cancers, or in situ cancers of the cervix, prostate, or breast duct within 5 years prior to screening.  '[x]'  '[]'   210 History or evidence of clinically significant disease (eg, malignancy, respiratory, gastrointestinal, renal or psychiatric disease) or unstable disorder that, in the opinion of the investigator(s), Amgen physician or  designee would pose a risk to the patient's safety or interfere with the study assessments, procedures, completion, or result in a life expectancy of less than 1 year.  '[x]'  '[]'   Prior/Concomitant Therapy  211 Previously received or receiving  evolocumab or any other therapy to inhibit PCSK9.  '[x]'  '[]'   212 Previously received a cholesterol ester transfer protein (CETP) inhibitor (ie, anacetrapib, dalcetrapib, evacetrapib), mipomersen, lomitapide, or has undergone LDL-apheresis in the last 12 months prior to LDL-C screening.  '[x]'  '[]'   Prior/Concurrent Clinical Study Experience  213 Currently receiving treatment in another investigational device or drug study, or less than 30 days since ending treatment on another investigational device or drug study(ies). '[x]'  '[]'   Other Exclusions  80 Male subject is pregnant, had a positive pregnancy test at screening, breastfeeding, or planning to become pregnant or breastfeed during treatment and for an additional 15 weeks after the last dose of investigational product.  '[x]'  '[]'   71 Male subjects of childbearing potential unwilling to use 1 acceptable method of effective contraception during treatment and for an additional 15 weeks after the last dose of investigational product. Refer to Section 12.5 of the protocol for additional contraceptive information.  '[x]'  '[]'   216 Subject has known sensitivity to any of the products or components to be administered during dosing.  '[x]'  '[]'   217 Subject likely to not be available to complete all protocol-required study visits or procedures, and/or to comply with all required study procedures to the best of the subject and investigator's knowledge.  '[x]'  '[]'   218 Subject is staff personal directly involved with the study or is a family member of the investigational study staff.  '[x]'  '[]'     999 Subject met eligibility criteria but did not enroll '[x]'  '[]'   [facility]-CV Screening Visit  Patient Name Kevin Sheppard. DOB [birth year]  Subject  ID# _[id number]  Visit Date/Informed Consent Date 10/21/18  Demographics      Sex male   Ethnicity Not hispanic or 43 Age 4 y.o.  Race '[]' White '[x]' Black or African American '[]' Asian '[]' American [place] and [place] Native '[]' Native Hawaiian '[]' Other Pacific Islander  Tobacco use  '[x]' never '[]' current '[]' former Type  '[]' Cigarettes '[]' Cigars '[]' E-cigarettes '[]' Smokeless  Quantity and unit ____ Frequency  '[]' Daily '[]' Every week '[]' Every month '[]' Occasional '[]' Other Duration ____  '[]' Days '[]' Weeks '[]' Months '[]' Years  Reproductive Status  '[]' Childbearing potentially (Primary method of birth control ____)      (Date of last menstrual period____) (Breastfeeding?____) '[]' postmenopausal '[]' Surgically sterile '[]' Infertile '[x]' other Male Current Statin atorvastatin 53m  Statin intolerance If the subject is not on a high-intensity statin, please indicate the primary reason. '[]' Not indicated per local professional society guidelines '[]' Demonstrated intolerance '[]' Subject refusal '[]' other  If demonstrated intolerance, provide statin(s) used ____  Select symptom related to statin use '[]' Myalgia '[]' Increase Creatine Kinase '[]' Increase in liver function enzymes '[]' Other  Local Labs  Collection Date  10/21/18  [id number] Fasting   '[x]' Yes   '[]' No '[]' Urine pregnancy test '[x]' Chemistry '[x]' Fasting lipid panel  Placebo injection Box # [id number] time 0935 '[x]' AM '[]' PM in clinic Quantity Administered    '[]' None '[]' Partial '[x]' Full Reason for None or Partial Dose ____ Administration Site '[]' Abdomen '[]' Arm '[x]' Thigh Laterality '[]' Left  '[x]' Right Administered by '[x]' Patient '[]' Caregiver

## 2018-10-23 ENCOUNTER — Encounter: Payer: Self-pay | Admitting: Family Medicine

## 2018-10-24 MED ORDER — CICLOPIROX OLAMINE 0.77 % EX CREA: TOPICAL_CREAM | Freq: Two times a day (BID) | CUTANEOUS | 0 refills | Status: DC

## 2018-10-27 LAB — COMPREHENSIVE METABOLIC PANEL
ALT: 55 IU/L — ABNORMAL HIGH (ref 0–44)
AST: 36 IU/L (ref 0–40)
Albumin/Globulin Ratio: 2 (ref 1.2–2.2)
Albumin: 4.7 g/dL (ref 3.8–4.8)
Alkaline Phosphatase: 84 IU/L (ref 39–117)
BUN/Creatinine Ratio: 19 (ref 10–24)
BUN: 14 mg/dL (ref 8–27)
Bilirubin Total: 0.4 mg/dL (ref 0.0–1.2)
CO2: 20 mmol/L (ref 20–29)
Calcium: 9.8 mg/dL (ref 8.6–10.2)
Chloride: 101 mmol/L (ref 96–106)
Creatinine, Ser: 0.72 mg/dL — ABNORMAL LOW (ref 0.76–1.27)
GFR calc non Af Amer: 97 mL/min/{1.73_m2} (ref >59)
GFR, EST AFRICAN AMERICAN: 112 mL/min/{1.73_m2} (ref >59)
Globulin, Total: 2.3 g/dL (ref 1.5–4.5)
Glucose: 131 mg/dL — ABNORMAL HIGH (ref 65–99)
Potassium: 4 mmol/L (ref 3.5–5.2)
Sodium: 139 mmol/L (ref 134–144)
TOTAL PROTEIN: 7 g/dL (ref 6.0–8.5)

## 2018-10-27 LAB — LIPID PANEL
Chol/HDL Ratio: 2.9 ratio (ref 0.0–5.0)
Cholesterol, Total: 133 mg/dL (ref 100–199)
HDL: 46 mg/dL (ref >39)
LDL CALC: 68 mg/dL (ref 0–99)
Triglycerides: 93 mg/dL (ref 0–149)
VLDL Cholesterol Cal: 19 mg/dL (ref 5–40)

## 2018-10-29 ENCOUNTER — Encounter: Payer: Self-pay | Admitting: Family Medicine

## 2018-10-31 ENCOUNTER — Encounter: Payer: Self-pay | Admitting: Family Medicine

## 2018-11-08 ENCOUNTER — Ambulatory Visit: Payer: 59 | Admitting: Family Medicine

## 2018-11-08 ENCOUNTER — Encounter: Payer: Self-pay | Admitting: Family Medicine

## 2018-11-08 VITALS — BP 128/84 | HR 69 | Temp 98.3°F | Ht 65.0 in | Wt 182.4 lb

## 2018-11-08 DIAGNOSIS — E1165 Type 2 diabetes mellitus with hyperglycemia: Secondary | ICD-10-CM | POA: Diagnosis not present

## 2018-11-08 DIAGNOSIS — E785 Hyperlipidemia, unspecified: Secondary | ICD-10-CM | POA: Diagnosis not present

## 2018-11-08 DIAGNOSIS — IMO0001 Reserved for inherently not codable concepts without codable children: Secondary | ICD-10-CM

## 2018-11-08 LAB — POCT GLYCOSYLATED HEMOGLOBIN (HGB A1C): Hemoglobin A1C: 8 % — AB (ref 4.0–5.6)

## 2018-11-08 NOTE — Assessment & Plan Note (Signed)
Chronic, improved with addition of atorvastatin. Continue this regimen.  The ASCVD Risk score Mikey Bussing DC Jr., et al., 2013) failed to calculate for the following reasons:   Unable to determine if patient is Non-Hispanic African American

## 2018-11-08 NOTE — Patient Instructions (Signed)
A1c is still too high. Work on limiting carbs in diet.  Recheck in 3 months.

## 2018-11-08 NOTE — Assessment & Plan Note (Signed)
Chronic, remains uncontrolled. He is motivated to work towards diabetic diet (limiting carbs) over next 3 months. Will reassess at 3 mo f/u visit. Pt agrees with plan. Continue current regimen of tradjenta and metformin.

## 2018-11-08 NOTE — Progress Notes (Signed)
BP 128/84   Pulse 69   Temp 98.3 F (36.8 C)   Ht '5\' 5"'  (1.651 m)   Wt 182 lb 6 oz (82.7 kg)   SpO2 95%   BMI 30.35 kg/m    CC: DM f/u Subjective:    Patient ID: Kevin Sheppard., male    DOB: Sep 01, 1951, 68 y.o.   MRN: [id number]  HPI: Kevin Sheppard. is a 68 y.o. 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 Usually skips breakfast before he goes to bed. Usually eats bowl of cereal or omelet.   DM - does regularly check sugars fasting 140-160 fasting. Compliant with antihyperglycemic regimen which includes: metformin 1060m bid, tradjenta 518mdaily. Denies low sugars or hypoglycemic symptoms. Denies paresthesias. Last diabetic eye exam 09/2018. Pneumovax: 07/2018. Prevnar: 05/2014. Glucometer brand: one-touch. [facility]  Component Value Date   HGBA1C 8.0 (A) 11/08/2018   Diabetic Foot Exam - Simple   Simple Foot Form Diabetic Foot exam was performed with the following findings:  Yes 11/08/2018  8:11 AM  Visual Inspection No deformities, no ulcerations, no other skin breakdown bilaterally:  Yes Sensation Testing Intact to touch and monofilament testing bilaterally:  Yes Pulse Check Posterior Tibialis and Dorsalis pulse intact bilaterally:  Yes Comments    Lab Results  Component Value Date   MICROALBUR 1.8 08/02/2018     HLD - hesitant for statin, saw cards and started on atorvastatin (on investigational trial). Last visit he started low chol diet with marked improvement in chol levels.      Relevant past medical, surgical, family and social history reviewed and updated as indicated. Interim medical history since our last visit reviewed. Allergies and medications reviewed and updated. Outpatient Medications Prior to Visit  Medication Sig Dispense Refill  . atorvastatin (LIPITOR) 20 MG tablet Take 1 tablet (20 mg total) by mouth daily. 30 tablet 11  . Blood Glucose Monitoring Suppl (ONE TOUCH ULTRA  MINI) w/Device KIT Use as directed 1 each 0  . ciclopirox (LOPROX) 0.77 % cream Apply topically 2 (two) times daily. 30 g 0  . diclofenac sodium (VOLTAREN) 1 % GEL Apply 1 application topically 3 (three) times daily. 1 Tube 1  . glucose blood (ONE TOUCH ULTRA TEST) test strip Check three times daily and as needed E11.65 100 each 6  . ibuprofen (IBU) 800 MG tablet Take 1 tablet (800 mg total) by mouth 2 (two) times daily as needed. 50 tablet 3  . levothyroxine (SYNTHROID, LEVOTHROID) 100 MCG tablet TAKE ONE TABLET BY MOUTH EVERY MORNING BEFORE BREAKFAST 30 tablet 10  . metFORMIN (GLUCOPHAGE) 1000 MG tablet Take 1 tablet (1,000 mg total) by mouth 2 (two) times daily with a meal. 180 tablet 2  . Multiple Vitamins-Minerals (MULTIVITAMIN ADULTS 50+ PO) Take 1 tablet by mouth daily.    . Omega-3 Fatty Acids (FISH OIL) 1000 MG CAPS Take 1 capsule by mouth daily.    . sildenafil (REVATIO) 20 MG tablet TAKE 2-5 TABLETS BY MOUTH DAILY AS NEEDED 30 tablet 6  . temazepam (RESTORIL) 15 MG capsule TAKE ONE CAPSULE BY MOUTH EVERY NIGHT AT BEDTIME AS NEEDED FOR SLEEP 30 capsule 0  . TRADJENTA 5 MG TABS tablet TAKE ONE TABLET BY MOUTH DAILY 30 tablet 2   No facility-administered medications prior to visit.      Per HPI unless specifically indicated in ROS section below Review of Systems Objective:    BP  128/84   Pulse 69   Temp 98.3 F (36.8 C)   Ht '5\' 5"'  (1.651 m)   Wt 182 lb 6 oz (82.7 kg)   SpO2 95%   BMI 30.35 kg/m   Wt Readings from Last 3 Encounters:  11/08/18 182 lb 6 oz (82.7 kg)  10/21/18 177 lb (80.3 kg)  08/30/18 179 lb (81.2 kg)    Physical Exam Vitals signs and nursing note reviewed.  Constitutional:      General: He is not in acute distress.    Appearance: Normal appearance. He is well-developed.  HENT:     Head: Normocephalic and atraumatic.     Right Ear: External ear normal.     Left Ear: External ear normal.     Nose: Nose normal.     Mouth/Throat:     Pharynx: No  oropharyngeal exudate.  Eyes:     General: No scleral icterus.    Conjunctiva/sclera: Conjunctivae normal.     Pupils: Pupils are equal, round, and reactive to light.  Neck:     Musculoskeletal: Normal range of motion and neck supple.  Cardiovascular:     Rate and Rhythm: Normal rate and regular rhythm.     Heart sounds: Normal heart sounds. No murmur.  Pulmonary:     Effort: Pulmonary effort is normal. No respiratory distress.     Breath sounds: Normal breath sounds. No wheezing or rales.  Musculoskeletal:     Comments: See HPI for foot exam if done  Lymphadenopathy:     Cervical: No cervical adenopathy.  Skin:    General: Skin is warm and dry.     Findings: No rash.  Neurological:     Mental Status: He is alert.       Results for orders placed or performed in visit on 11/08/18  POCT glycosylated hemoglobin (Hb A1C)  Result Value Ref Range   Hemoglobin A1C 8.0 (A) 4.0 - 5.6 %   HbA1c POC (<> result, manual entry)     HbA1c, POC (prediabetic range)     HbA1c, POC (controlled diabetic range)     Assessment & Plan:   Problem List Items Addressed This Visit    HLD (hyperlipidemia)    Chronic, improved with addition of atorvastatin. Continue this regimen.  The ASCVD Risk score Mikey Bussing DC Jr., et al., 2013) failed to calculate for the following reasons:   Unable to determine if patient is Non-Hispanic African American       Diabetes mellitus type 2, uncontrolled, without complications ([place]) - Primary    Chronic, remains uncontrolled. He is motivated to work towards diabetic diet (limiting carbs) over next 3 months. Will reassess at 3 mo f/u visit. Pt agrees with plan. Continue current regimen of tradjenta and metformin.      Relevant Orders   POCT glycosylated hemoglobin (Hb A1C) (Completed)       No orders of the defined types were placed in this encounter.  Orders Placed This Encounter  Procedures  . POCT glycosylated hemoglobin (Hb A1C)    Follow up plan: Return  in about 3 months (around 02/06/2019) for follow up visit.  Kevin Bush, MD

## 2018-11-25 ENCOUNTER — Other Ambulatory Visit: Payer: Self-pay | Admitting: Family Medicine

## 2018-11-25 NOTE — Telephone Encounter (Signed)
Name of Medication: Temazepam Name of [facility]acy: [facility] or Written Date and Quantity: 10/13/18, #30 Last Office Visit and Type: 11/08/18, f/u Next Office Visit and Type: 02/04/19, 3 mo DM f/u Last Controlled Substance Agreement Date: 05/06/16 Last UDS: 05/06/16

## 2018-11-26 NOTE — Telephone Encounter (Signed)
Eprescribed.

## 2018-12-11 ENCOUNTER — Other Ambulatory Visit: Payer: Self-pay | Admitting: Family Medicine

## 2019-02-01 ENCOUNTER — Encounter: Payer: Self-pay | Admitting: Family Medicine

## 2019-02-01 ENCOUNTER — Ambulatory Visit (INDEPENDENT_AMBULATORY_CARE_PROVIDER_SITE_OTHER): Payer: 59 | Admitting: Family Medicine

## 2019-02-01 VITALS — Ht 65.0 in | Wt 175.0 lb

## 2019-02-01 DIAGNOSIS — IMO0001 Reserved for inherently not codable concepts without codable children: Secondary | ICD-10-CM

## 2019-02-01 DIAGNOSIS — E1165 Type 2 diabetes mellitus with hyperglycemia: Secondary | ICD-10-CM | POA: Diagnosis not present

## 2019-02-01 MED ORDER — SEMAGLUTIDE(0.25 OR 0.5MG/DOS) 2 MG/1.5ML ~~LOC~~ SOPN: PEN_INJECTOR | SUBCUTANEOUS | 1 refills | Status: DC

## 2019-02-01 NOTE — Progress Notes (Signed)
Virtual visit completed through [employer]. Due to national recommendations of social distancing due to COVID-19, a virtual visit is felt to be most appropriate for this patient at this time.   Patient location: home Provider location: [place] [facility], office If any vitals were documented, they were collected by patient at home unless specified below.    Ht _0  (1.651 m)   Wt 175 lb (79.4 kg)   BMI 29.12 kg/m   He will message [place] set of vital signs through mychart for today.   CC: 3 mo DM f/u visit Subjective:    Patient ID: Kevin Sheppard., male    DOB: 1950/12/08, 68 y.o.   MRN: [id number]  HPI: Kevin Sheppard. is a 68 y.o. male presenting on 02/01/2019 for Follow-up (3 mo f/u.)   Back to work these last 3 weeks.  DM - does regularly check sugars 160-180 fasting, up to 200s. Compliant with antihyperglycemic regimen which includes: metformin 1071m bid, tradjenta 533mdaily. Denies low sugars or hypoglycemic symptoms. Denies paresthesias. Last diabetic eye exam 09/2018. Pneumovax: 07/2018. Prevnar: 05/2014. Glucometer brand: one-touch ultra mini. DSME: completed 2011. Lab Results  Component Value Date   HGBA1C 8.0 (A) 11/08/2018   Diabetic Foot Exam - Simple   No data filed     Lab Results  Component Value Date   MICROALBUR 1.8 08/02/2018         Relevant past medical, surgical, family and social history reviewed and updated as indicated. Interim medical history since our last visit reviewed. Allergies and medications reviewed and updated. Outpatient Medications Prior to Visit  Medication Sig Dispense Refill  . atorvastatin (LIPITOR) 20 MG tablet Take 1 tablet (20 mg total) by mouth daily. 30 tablet 11  . Blood Glucose Monitoring Suppl (ONE TOUCH ULTRA MINI) w/Device KIT Use as directed 1 each 0  . ciclopirox (LOPROX) 0.77 % cream Apply topically 2 (two) times daily. 30 g 0  . diclofenac sodium (VOLTAREN) 1 % GEL Apply 1 application topically 3 (three) times daily.  1 Tube 1  . glucose blood (ONE TOUCH ULTRA TEST) test strip Check three times daily and as needed E11.65 100 each 6  . ibuprofen (IBU) 800 MG tablet Take 1 tablet (800 mg total) by mouth 2 (two) times daily as needed. 50 tablet 3  . levothyroxine (SYNTHROID, LEVOTHROID) 100 MCG tablet TAKE ONE TABLET BY MOUTH EVERY MORNING BEFORE BREAKFAST 30 tablet 10  . metFORMIN (GLUCOPHAGE) 1000 MG tablet Take 1 tablet (1,000 mg total) by mouth 2 (two) times daily with a meal. 180 tablet 2  . Multiple Vitamins-Minerals (MULTIVITAMIN ADULTS 50+ PO) Take 1 tablet by mouth daily.    . Omega-3 Fatty Acids (FISH OIL) 1000 MG CAPS Take 1 capsule by mouth daily.    . sildenafil (REVATIO) 20 MG tablet TAKE 2-5 TABLETS BY MOUTH DAILY AS NEEDED 30 tablet 6  . temazepam (RESTORIL) 15 MG capsule TAKE ONE CAPSULE BY MOUTH EVERY NIGHT AT BEDTIME AS NEEDED FOR SLEEP 30 capsule 0  . TRADJENTA 5 MG TABS tablet TAKE ONE TABLET BY MOUTH DAILY 30 tablet 2   No facility-administered medications prior to visit.      Per HPI unless specifically indicated in ROS section below Review of Systems Objective:    Ht _1  (1.651 m)   Wt 175 lb (79.4 kg)   BMI 29.12 kg/m   Wt Readings from Last 3 Encounters:  02/01/19 175 lb (79.4 kg)  11/08/18 182 lb 6  oz (82.7 kg)  10/21/18 177 lb (80.3 kg)     Physical exam: Gen: alert, NAD, not ill appearing Pulm: speaks in complete sentences without increased work of breathing Psych: normal mood, normal thought content      Results for orders placed or performed in visit on 11/08/18  POCT glycosylated hemoglobin (Hb A1C)  Result Value Ref Range   Hemoglobin A1C 8.0 (A) 4.0 - 5.6 %   HbA1c POC (<> result, manual entry)     HbA1c, POC (prediabetic range)     HbA1c, POC (controlled diabetic range)     Lab Results  Component Value Date   TSH 1.61 08/02/2018    Assessment & Plan:   Problem List Items Addressed This Visit    Diabetes mellitus type 2, uncontrolled, without  complications ([place]) - Primary    Chronic, remaining uncontrolled, more difficulty with exercise routine and diet due to stay at home [phone]). He agrees to try [place]. No fmhx MTC, reviewed association in animal models. Start ozempic 0.20m x 2 wks then increase to 0.577mweekly. This will be in place of tradjenta. Continue metformin. RTC 6 wks f/u visit. Wife is able to teach him administration - she is RNTherapist, sports      Relevant Medications   Semaglutide,0.25 or 0.5MG/DOS, (OZEMPIC, 0.25 OR 0.5 MG/DOSE,) 2 MG/1.5ML SOPN       Meds ordered this encounter  Medications  . Semaglutide,0.25 or 0.5MG/DOS, (OZEMPIC, 0.25 OR 0.5 MG/DOSE,) 2 MG/1.5ML SOPN    Sig: Inject 0.25 mg into the skin once a week for 14 days, THEN 0.5 mg once a week for 30 days.    Dispense:  1 pen    Refill:  1    Price out this vs trulicity   No orders of the defined types were placed in this encounter.   Follow up plan: Return in about 6 weeks (around 03/15/2019) for follow up visit.  JaRia BushMD

## 2019-02-01 NOTE — Assessment & Plan Note (Signed)
Chronic, remaining uncontrolled, more difficulty with exercise routine and diet due to stay at home [phone]). He agrees to try [place]. No fmhx MTC, reviewed association in animal models. Start ozempic 0.25mg  x 2 wks then increase to 0.5mg  weekly. This will be in place of tradjenta. Continue metformin. RTC 6 wks f/u visit. Wife is able to teach him administration - she is Therapist, sports.

## 2019-02-04 ENCOUNTER — Ambulatory Visit: Payer: 59 | Admitting: Family Medicine

## 2019-02-10 ENCOUNTER — Ambulatory Visit: Payer: 59 | Admitting: Family Medicine

## 2019-03-15 ENCOUNTER — Other Ambulatory Visit: Payer: Self-pay | Admitting: Family Medicine

## 2019-03-21 ENCOUNTER — Encounter: Payer: Self-pay | Admitting: Family Medicine

## 2019-03-21 ENCOUNTER — Ambulatory Visit (INDEPENDENT_AMBULATORY_CARE_PROVIDER_SITE_OTHER): Payer: 59 | Admitting: Family Medicine

## 2019-03-21 VITALS — Ht 65.0 in | Wt 174.0 lb

## 2019-03-21 DIAGNOSIS — E1165 Type 2 diabetes mellitus with hyperglycemia: Secondary | ICD-10-CM | POA: Diagnosis not present

## 2019-03-21 DIAGNOSIS — R740 Nonspecific elevation of levels of transaminase and lactic acid dehydrogenase [LDH]: Secondary | ICD-10-CM | POA: Diagnosis not present

## 2019-03-21 DIAGNOSIS — IMO0001 Reserved for inherently not codable concepts without codable children: Secondary | ICD-10-CM

## 2019-03-21 DIAGNOSIS — R7401 Elevation of levels of liver transaminase levels: Secondary | ICD-10-CM

## 2019-03-21 DIAGNOSIS — N529 Male erectile dysfunction, unspecified: Secondary | ICD-10-CM | POA: Diagnosis not present

## 2019-03-21 MED ORDER — OZEMPIC (0.25 OR 0.5 MG/DOSE) 2 MG/1.5ML ~~LOC~~ SOPN: 0.5000 mg | PEN_INJECTOR | SUBCUTANEOUS | 3 refills | Status: DC

## 2019-03-21 MED ORDER — SILDENAFIL CITRATE 20 MG PO TABS: ORAL_TABLET | ORAL | 6 refills | Status: DC

## 2019-03-21 NOTE — Assessment & Plan Note (Signed)
Chronic, improved control noted by patient based on recall cbg's. However elevated readings persist. He declines increasing ozempic dose at this time. Will continue ozempic and metformin. Reassess at f/u visit 2 months (in office).

## 2019-03-21 NOTE — Progress Notes (Signed)
Virtual visit completed through [employer]. Due to national recommendations of social distancing due to COVID-19, a virtual visit is felt to be most appropriate for this patient at this time. Reviewed limitations of a virtual visit.   Patient location: outside at work Provider location: [facility] at [facility], office If any vitals were documented, they were collected by patient at home unless specified below.    Ht '5\' 5"'  (1.651 m)   Wt 174 lb (78.9 kg)   BMI 28.96 kg/m    CC: DM f/u visit Subjective:    Patient ID: Kevin Osmun., male    DOB: Dec 07, 1950, 68 y.o.   MRN: [id number]  HPI: Kevin Handel. is a 68 y.o. male presenting on 03/21/2019 for Diabetes (6 wk f/u.)   DM - does regularly check sugars 150-160 fasting. Misses swimming. Compliant with antihyperglycemic regimen which includes: metformin 1026m bid, tradjenta 573mdaily. Last visit we started ozempic - currently on 0.31m34meekly dose and tolerating well without nausea. Notes decreased appetite. Denies low sugars or hypoglycemic symptoms. Denies paresthesias. Last diabetic eye exam 09/2018. Pneumovax: 07/2018. Prevnar: 05/2014. Glucometer brand: One-touch ultra mini. DSME: completed 2011. Lab Results  Component Value Date   HGBA1C 8.0 (A) 11/08/2018   Diabetic Foot Exam - Simple   No data filed     Lab Results  Component Value Date   MICROALBUR 1.8 08/02/2018        Relevant past medical, surgical, family and social history reviewed and updated as indicated. Interim medical history since our last visit reviewed. Allergies and medications reviewed and updated. Outpatient Medications Prior to Visit  Medication Sig Dispense Refill  . atorvastatin (LIPITOR) 20 MG tablet Take 1 tablet (20 mg total) by mouth daily. 30 tablet 11  . Blood Glucose Monitoring Suppl (ONE TOUCH ULTRA MINI) w/Device KIT Use as directed 1 each 0  . ciclopirox (LOPROX) 0.77 % cream Apply topically 2 (two) times daily. 30 g 0  . diclofenac sodium  (VOLTAREN) 1 % GEL Apply 1 application topically 3 (three) times daily. 1 Tube 1  . glucose blood (ONE TOUCH ULTRA TEST) test strip Check three times daily and as needed E11.65 100 each 6  . ibuprofen (IBU) 800 MG tablet Take 1 tablet (800 mg total) by mouth 2 (two) times daily as needed. 50 tablet 3  . levothyroxine (SYNTHROID, LEVOTHROID) 100 MCG tablet TAKE ONE TABLET BY MOUTH EVERY MORNING BEFORE BREAKFAST 30 tablet 10  . metFORMIN (GLUCOPHAGE) 1000 MG tablet TAKE ONE TABLET BY MOUTH TWICE A DAY WITH A MEAL 60 tablet 5  . Multiple Vitamins-Minerals (MULTIVITAMIN ADULTS 50+ PO) Take 1 tablet by mouth daily.    . Omega-3 Fatty Acids (FISH OIL) 1000 MG CAPS Take 1 capsule by mouth daily.    . temazepam (RESTORIL) 15 MG capsule TAKE ONE CAPSULE BY MOUTH EVERY NIGHT AT BEDTIME AS NEEDED FOR SLEEP 30 capsule 0  . sildenafil (REVATIO) 20 MG tablet TAKE 2-5 TABLETS BY MOUTH DAILY AS NEEDED 30 tablet 6  . TRADJENTA 5 MG TABS tablet TAKE ONE TABLET BY MOUTH DAILY 30 tablet 2   No facility-administered medications prior to visit.      Per HPI unless specifically indicated in ROS section below Review of Systems Objective:    Ht '5\' 5"'  (1.651 m)   Wt 174 lb (78.9 kg)   BMI 28.96 kg/m   Wt Readings from Last 3 Encounters:  03/21/19 174 lb (78.9 kg)  02/01/19 175 lb (79.4 kg)  11/08/18 182 lb 6 oz (82.7 kg)     Physical exam: Gen: alert, NAD, not ill appearing Pulm: speaks in complete sentences without increased work of breathing Psych: normal mood, normal thought content      Results for orders placed or performed in visit on 11/08/18  POCT glycosylated hemoglobin (Hb A1C)  Result Value Ref Range   Hemoglobin A1C 8.0 (A) 4.0 - 5.6 %   HbA1c POC (<> result, manual entry)     HbA1c, POC (prediabetic range)     HbA1c, POC (controlled diabetic range)     Assessment & Plan:   Problem List Items Addressed This Visit    Erectile dysfunction    Requests generic sildenafil 58m refilled.        Diabetes mellitus type 2, uncontrolled, without complications ([facility]) - Primary    Chronic, improved control noted by patient based on recall cbg's. However elevated readings persist. Kevin Sheppard declines increasing ozempic dose at this time. Will continue ozempic and metformin. Reassess at f/u visit 2 months (in office).       Relevant Medications   Semaglutide,0.25 or 0.5MG/DOS, (OZEMPIC, 0.25 OR 0.5 MG/DOSE,) 2 MG/1.5ML SOPN   Other Relevant Orders   Hemoglobin A1c    Other Visit Diagnoses    Transaminitis       Relevant Orders   Comprehensive metabolic panel   Ferritin       Meds ordered this encounter  Medications  . Semaglutide,0.25 or 0.5MG/DOS, (OZEMPIC, 0.25 OR 0.5 MG/DOSE,) 2 MG/1.5ML SOPN    Sig: Inject 0.5 mg into the skin once a week.    Dispense:  1 pen    Refill:  3  . sildenafil (REVATIO) 20 MG tablet    Sig: TAKE 2-5 TABLETS BY MOUTH DAILY AS NEEDED    Dispense:  30 tablet    Refill:  6   Orders Placed This Encounter  Procedures  . Comprehensive metabolic panel    Standing Status:   Future    Standing Expiration Date:   03/20/2020  . Hemoglobin A1c    Standing Status:   Future    Standing Expiration Date:   03/20/2020  . Ferritin    Standing Status:   Future    Standing Expiration Date:   03/20/2020    I discussed the assessment and treatment plan with the patient. The patient was provided an opportunity to ask questions and all were answered. The patient agreed with the plan and demonstrated an understanding of the instructions. The patient was advised to call back or seek an in-person evaluation if the symptoms worsen or if the condition fails to improve as anticipated.  Follow up plan: No follow-ups on file.  JRia Bush MD

## 2019-03-21 NOTE — Assessment & Plan Note (Signed)
Requests generic sildenafil 20mg  refilled.

## 2019-04-05 ENCOUNTER — Telehealth: Payer: Self-pay | Admitting: Family Medicine

## 2019-04-05 ENCOUNTER — Other Ambulatory Visit (INDEPENDENT_AMBULATORY_CARE_PROVIDER_SITE_OTHER): Payer: 59

## 2019-04-05 ENCOUNTER — Other Ambulatory Visit: Payer: Self-pay | Admitting: *Deleted

## 2019-04-05 ENCOUNTER — Other Ambulatory Visit: Payer: 59

## 2019-04-05 DIAGNOSIS — IMO0001 Reserved for inherently not codable concepts without codable children: Secondary | ICD-10-CM

## 2019-04-05 DIAGNOSIS — R7401 Elevation of levels of liver transaminase levels: Secondary | ICD-10-CM

## 2019-04-05 DIAGNOSIS — R740 Nonspecific elevation of levels of transaminase and lactic acid dehydrogenase [LDH]: Secondary | ICD-10-CM

## 2019-04-05 DIAGNOSIS — E1165 Type 2 diabetes mellitus with hyperglycemia: Secondary | ICD-10-CM

## 2019-04-05 LAB — POCT GLYCOSYLATED HEMOGLOBIN (HGB A1C): Hemoglobin A1C: 8 % — AB (ref 4.0–5.6)

## 2019-04-05 NOTE — Telephone Encounter (Signed)
Let's do PA for this, thanks

## 2019-04-05 NOTE — Telephone Encounter (Signed)
Patient called back. Stated that he did pick up the [place]. His wife stated it is in her refrigerator at home.

## 2019-04-05 NOTE — Telephone Encounter (Signed)
Prior Authorization information received from [facility] for: Semaglutide,0.25 or 0.5MG /DOS, (OZEMPIC, 0.25 OR 0.5 MG/DOSE,) 2 MG/1.5ML SOPN 0.5 mg, Weekly         Please advise if you wish to complete the PA or try an alternative. Pt is currently not taking this medication, was refilled on 03/21/2019 but patient did not pick this up d/t insurance not covering.    [email or web address] Key: Kevin Sheppard

## 2019-04-06 LAB — COMPREHENSIVE METABOLIC PANEL
ALT: 44 U/L (ref 0–53)
AST: 43 U/L — ABNORMAL HIGH (ref 0–37)
Albumin: 4.7 g/dL (ref 3.5–5.2)
Alkaline Phosphatase: 70 U/L (ref 39–117)
BUN: 16 mg/dL (ref 6–23)
CO2: 22 mEq/L (ref 19–32)
Calcium: 9.7 mg/dL (ref 8.4–10.5)
Chloride: 105 mEq/L (ref 96–112)
Creatinine, Ser: 0.69 mg/dL (ref 0.40–1.50)
GFR: 114.42 mL/min (ref >60.00)
Glucose, Bld: 161 mg/dL — ABNORMAL HIGH (ref 70–99)
Potassium: 4.1 mEq/L (ref 3.5–5.1)
Sodium: 138 mEq/L (ref 135–145)
Total Bilirubin: 0.6 mg/dL (ref 0.2–1.2)
Total Protein: 6.8 g/dL (ref 6.0–8.3)

## 2019-04-06 LAB — FERRITIN: Ferritin: 231 ng/mL (ref 22.0–322.0)

## 2019-04-06 NOTE — Telephone Encounter (Signed)
[facility] - patient picked up Rx yesterday, need to know if PA is still needed.  Line busy - will call back later

## 2019-04-07 NOTE — Telephone Encounter (Signed)
Spoke with pharmacist. Patient picked up Ozempic about 3 weeks ago but for the next fill it does need PA.

## 2019-04-08 NOTE — Telephone Encounter (Signed)
PA form submitted and faxed in to insurance today. Unable to complete this on covermymeds due to website not working properly.not pulling in information. Awaiting reply.

## 2019-04-11 NOTE — Telephone Encounter (Signed)
Noted  

## 2019-04-12 NOTE — Telephone Encounter (Addendum)
Received faxed PA approval effective 04/09/2019- 04/08/2020.

## 2019-04-16 ENCOUNTER — Other Ambulatory Visit: Payer: Self-pay | Admitting: Family Medicine

## 2019-04-18 NOTE — Telephone Encounter (Signed)
Eprescribed.

## 2019-04-18 NOTE — Telephone Encounter (Signed)
Name of Medication: Temazepam Name of Pharmacy: [facility] or Written Date and Quantity: 11/26/18, #30 Last Office Visit and Type: 03/21/19, f/u Next Office Visit and Type: 06/10/19, DM f/u Last Controlled Substance Agreement Date: 05/06/16 Last UDS: 05/06/16

## 2019-06-10 ENCOUNTER — Ambulatory Visit: Payer: 59 | Admitting: Family Medicine

## 2019-06-17 ENCOUNTER — Encounter: Payer: Self-pay | Admitting: Family Medicine

## 2019-06-17 ENCOUNTER — Ambulatory Visit: Payer: 59 | Admitting: Family Medicine

## 2019-06-17 ENCOUNTER — Other Ambulatory Visit: Payer: Self-pay

## 2019-06-17 DIAGNOSIS — G4726 Circadian rhythm sleep disorder, shift work type: Secondary | ICD-10-CM | POA: Diagnosis not present

## 2019-06-17 DIAGNOSIS — E1169 Type 2 diabetes mellitus with other specified complication: Secondary | ICD-10-CM

## 2019-06-17 LAB — POCT GLYCOSYLATED HEMOGLOBIN (HGB A1C): Hemoglobin A1C: 7.9 % — AB (ref 4.0–5.6)

## 2019-06-17 MED ORDER — TEMAZEPAM 15 MG PO CAPS: 15.0000 mg | ORAL_CAPSULE | Freq: Every evening | ORAL | 2 refills | Status: DC | PRN

## 2019-06-17 MED ORDER — OZEMPIC (0.25 OR 0.5 MG/DOSE) 2 MG/1.5ML ~~LOC~~ SOPN: 0.5000 mg | PEN_INJECTOR | SUBCUTANEOUS | 1 refills | Status: AC

## 2019-06-17 NOTE — Progress Notes (Signed)
This visit was conducted in person.  BP 122/82 (BP Location: Left Arm, Patient Position: Sitting, Cuff Size: Normal)   Pulse 68   Temp 98.1 F (36.7 C) (Temporal)   Ht _0  (1.651 m)   Wt 175 lb 1 oz (79.4 kg)   SpO2 96%   BMI 29.13 kg/m    CC: DM f/u visit Subjective:    Patient ID: Kevin Kirtz., male    DOB: September 15, 1951, 68 y.o.   MRN: [id number]  HPI: Kevin Kau. is a 68 y.o. male presenting on 06/17/2019 for Diabetes (Here for f/u.  Pt mentioned Ozempic causes minor diarrhea but says it works good for Kevin Sheppard. )   Discussed [phone].   Back on third shift - requests temazepam refilled.   DM - does regularly check sugars 110s fasting and postprandial. Compliant with antihyperglycemic regimen which includes: metformin 1039m bid, ozempic 0.552mweekly. Denies low sugars or hypoglycemic symptoms. Denies paresthesias. Last diabetic eye exam 09/2018. Pneumovax: 2019. Prevnar: 2015. Glucometer brand: one-touch ultra. DSME: completed 2011. Has restarted swimming.  Lab Results  Component Value Date   HGBA1C 7.9 (A) 06/17/2019   Diabetic Foot Exam - Simple   Simple Foot Form Diabetic Foot exam was performed with the following findings: Yes 06/17/2019  8:09 AM  Visual Inspection No deformities, no ulcerations, no other skin breakdown bilaterally: Yes Sensation Testing Intact to touch and monofilament testing bilaterally: Yes Pulse Check Posterior Tibialis and Dorsalis pulse intact bilaterally: Yes Comments    Lab Results  Component Value Date   MICROALBUR 1.8 08/02/2018        Relevant past medical, surgical, family and social history reviewed and updated as indicated. Interim medical history since our last visit reviewed. Allergies and medications reviewed and updated. Outpatient Medications Prior to Visit  Medication Sig Dispense Refill  . atorvastatin (LIPITOR) 20 MG tablet Take 1 tablet (20 mg total) by mouth daily. 30 tablet 11  . Blood Glucose Monitoring Suppl (ONE  TOUCH ULTRA MINI) w/Device KIT Use as directed 1 each 0  . ciclopirox (LOPROX) 0.77 % cream Apply topically 2 (two) times daily. 30 g 0  . diclofenac sodium (VOLTAREN) 1 % GEL Apply 1 application topically 3 (three) times daily. 1 Tube 1  . glucose blood (ONE TOUCH ULTRA TEST) test strip Check three times daily and as needed E11.65 100 each 6  . ibuprofen (IBU) 800 MG tablet Take 1 tablet (800 mg total) by mouth 2 (two) times daily as needed. 50 tablet 3  . levothyroxine (SYNTHROID, LEVOTHROID) 100 MCG tablet TAKE ONE TABLET BY MOUTH EVERY MORNING BEFORE BREAKFAST 30 tablet 10  . metFORMIN (GLUCOPHAGE) 1000 MG tablet TAKE ONE TABLET BY MOUTH TWICE A DAY WITH A MEAL 60 tablet 5  . Multiple Vitamins-Minerals (MULTIVITAMIN ADULTS 50+ PO) Take 1 tablet by mouth daily.    . Omega-3 Fatty Acids (FISH OIL) 1000 MG CAPS Take 1 capsule by mouth daily.    . sildenafil (REVATIO) 20 MG tablet TAKE 2-5 TABLETS BY MOUTH DAILY AS NEEDED 30 tablet 6  . temazepam (RESTORIL) 15 MG capsule TAKE ONE CAPSULE BY MOUTH EVERY NIGHT AT BEDTIME AS NEEDED FOR SLEEP 30 capsule 0   No facility-administered medications prior to visit.      Per HPI unless specifically indicated in ROS section below Review of Systems Objective:    BP 122/82 (BP Location: Left Arm, Patient Position: Sitting, Cuff Size: Normal)   Pulse 68   Temp 98.1 F (36.7 C) (  Temporal)   Ht _0  (1.651 m)   Wt 175 lb 1 oz (79.4 kg)   SpO2 96%   BMI 29.13 kg/m   Wt Readings from Last 3 Encounters:  06/17/19 175 lb 1 oz (79.4 kg)  03/21/19 174 lb (78.9 kg)  02/01/19 175 lb (79.4 kg)    Physical Exam Vitals signs and nursing note reviewed.  Constitutional:      General: He is not in acute distress.    Appearance: Normal appearance. He is well-developed. He is not ill-appearing.  HENT:     Head: Normocephalic and atraumatic.     Right Ear: External ear normal.     Left Ear: External ear normal.     Nose: Nose normal.     Mouth/Throat:      Pharynx: No oropharyngeal exudate.  Eyes:     General: No scleral icterus.    Conjunctiva/sclera: Conjunctivae normal.     Pupils: Pupils are equal, round, and reactive to light.  Neck:     Musculoskeletal: Normal range of motion and neck supple.  Cardiovascular:     Rate and Rhythm: Normal rate and regular rhythm.     Heart sounds: Normal heart sounds. No murmur.  Pulmonary:     Effort: Pulmonary effort is normal. No respiratory distress.     Breath sounds: Normal breath sounds. No wheezing or rales.  Musculoskeletal:     Comments: See HPI for foot exam if done  Lymphadenopathy:     Cervical: No cervical adenopathy.  Skin:    General: Skin is warm and dry.     Findings: No rash.  Neurological:     Mental Status: He is alert.       Results for orders placed or performed in visit on 06/17/19  POCT glycosylated hemoglobin (Hb A1C)  Result Value Ref Range   Hemoglobin A1C 7.9 (A) 4.0 - 5.6 %   HbA1c POC (<> result, manual entry)     HbA1c, POC (prediabetic range)     HbA1c, POC (controlled diabetic range)     Assessment & Plan:   Problem List Items Addressed This Visit    Type 2 diabetes mellitus with other specified complication (HCC)    Chronic, improved based on improved readings and A1c. Continue current regimen. Consider lower metformin dose if worsening diarrhea.       Relevant Medications   Semaglutide,0.25 or 0.5MG/DOS, (OZEMPIC, 0.25 OR 0.5 MG/DOSE,) 2 MG/1.5ML SOPN   Other Relevant Orders   POCT glycosylated hemoglobin (Hb A1C) (Completed)   Shift work sleep disorder    Has restarted 3rd shift.  Temazepam refilled to use PRN.           Meds ordered this encounter  Medications  . temazepam (RESTORIL) 15 MG capsule    Sig: Take 1 capsule (15 mg total) by mouth at bedtime as needed for sleep.    Dispense:  30 capsule    Refill:  2  . Semaglutide,0.25 or 0.5MG/DOS, (OZEMPIC, 0.25 OR 0.5 MG/DOSE,) 2 MG/1.5ML SOPN    Sig: Inject 0.5 mg into the skin once a  week.    Dispense:  3 pen    Refill:  1   Orders Placed This Encounter  Procedures  . POCT glycosylated hemoglobin (Hb A1C)   Patient Instructions  A1c today.  You are doing well today.  Continue ozempic 0.46m weekly.  Continue current medicines. Return as needed or for physical.    Follow up plan: No follow-ups on file.  Ria Bush, MD

## 2019-06-17 NOTE — Patient Instructions (Addendum)
A1c today.  You are doing well today.  Continue ozempic 0.5mg  weekly.  Continue current medicines. Return as needed or for physical.

## 2019-06-17 NOTE — Assessment & Plan Note (Addendum)
Chronic, improved based on improved readings and A1c. Continue current regimen. Consider lower metformin dose if worsening diarrhea.

## 2019-06-17 NOTE — Assessment & Plan Note (Signed)
Has restarted 3rd shift.  Temazepam refilled to use PRN.

## 2019-06-19 ENCOUNTER — Other Ambulatory Visit: Payer: Self-pay | Admitting: Family Medicine

## 2019-07-09 ENCOUNTER — Other Ambulatory Visit: Payer: Self-pay | Admitting: Family Medicine

## 2019-08-15 ENCOUNTER — Other Ambulatory Visit: Payer: Self-pay | Admitting: Family Medicine

## 2019-08-19 ENCOUNTER — Telehealth: Payer: Self-pay

## 2019-08-19 MED ORDER — ACCU-CHEK GUIDE W/DEVICE KIT: 1.0000 | PACK | Freq: Once | 0 refills | Status: AC

## 2019-08-19 NOTE — Telephone Encounter (Signed)
Pt left v/m that he lost his diabetic meter and wants to know if our office has an accuchek meter in office that pt can have. Pt last seen 06/17/19 for diabetes.

## 2019-08-19 NOTE — Telephone Encounter (Signed)
Spoke to pt. He wants an Accu-Chek Guide. I sent in the rx.

## 2019-08-30 ENCOUNTER — Other Ambulatory Visit: Payer: Self-pay | Admitting: Family Medicine

## 2019-08-30 DIAGNOSIS — Z125 Encounter for screening for malignant neoplasm of prostate: Secondary | ICD-10-CM

## 2019-08-30 DIAGNOSIS — E039 Hypothyroidism, unspecified: Secondary | ICD-10-CM

## 2019-08-30 DIAGNOSIS — E1169 Type 2 diabetes mellitus with other specified complication: Secondary | ICD-10-CM

## 2019-08-31 ENCOUNTER — Other Ambulatory Visit: Payer: 59

## 2019-09-07 ENCOUNTER — Encounter: Payer: 59 | Admitting: Family Medicine

## 2019-09-13 ENCOUNTER — Other Ambulatory Visit: Payer: Self-pay | Admitting: Family Medicine

## 2019-09-14 ENCOUNTER — Other Ambulatory Visit: Payer: Self-pay | Admitting: Family Medicine

## 2019-09-16 DIAGNOSIS — U071 COVID-19: Secondary | ICD-10-CM

## 2019-09-16 HISTORY — DX: COVID-19: U07.1

## 2019-09-23 ENCOUNTER — Telehealth: Payer: Self-pay | Admitting: Family Medicine

## 2019-09-23 NOTE — Telephone Encounter (Signed)
Spoke with pt relaying Dr. G's message. Pt verbalizes understanding.  

## 2019-09-23 NOTE — Telephone Encounter (Signed)
Spoke with pt.  States he was tested at [facility] yesterday.  Says he noticed a sore throat, runny nose and HA on 09/18/19.  Just noticed a rash on neck.  Pls advise.

## 2019-09-23 NOTE — Telephone Encounter (Signed)
Rash typically isn't a symptom of covid. May use benadryl cream and hydrocortisone OTC cream for rash, let us know if not better. However, with wife will covid do recommend self quarantine and separation from wife in house as much as feasible given how contagious covid is.

## 2019-09-23 NOTE — Telephone Encounter (Signed)
Patient is requesting a call back He stated his wife tested positive for covid.  He had a covid test done yesterday and is still waiting on results from his test.  He stated that he has developed a rash.  And a friend developed a rash with covid and they were advised to use benadryl cream and "tablets"   He is requesting a call back

## 2019-09-27 ENCOUNTER — Encounter: Payer: Self-pay | Admitting: Family Medicine

## 2019-09-28 ENCOUNTER — Encounter: Payer: Self-pay | Admitting: Family Medicine

## 2019-10-05 ENCOUNTER — Other Ambulatory Visit: Payer: Self-pay

## 2019-10-05 ENCOUNTER — Other Ambulatory Visit (INDEPENDENT_AMBULATORY_CARE_PROVIDER_SITE_OTHER): Payer: 59

## 2019-10-05 DIAGNOSIS — E039 Hypothyroidism, unspecified: Secondary | ICD-10-CM | POA: Diagnosis not present

## 2019-10-05 DIAGNOSIS — E785 Hyperlipidemia, unspecified: Secondary | ICD-10-CM | POA: Diagnosis not present

## 2019-10-05 DIAGNOSIS — E1169 Type 2 diabetes mellitus with other specified complication: Secondary | ICD-10-CM | POA: Diagnosis not present

## 2019-10-05 DIAGNOSIS — Z125 Encounter for screening for malignant neoplasm of prostate: Secondary | ICD-10-CM

## 2019-10-05 LAB — COMPREHENSIVE METABOLIC PANEL
ALT: 41 U/L (ref 0–53)
AST: 31 U/L (ref 0–37)
Albumin: 4.6 g/dL (ref 3.5–5.2)
Alkaline Phosphatase: 88 U/L (ref 39–117)
BUN: 14 mg/dL (ref 6–23)
CO2: 24 mEq/L (ref 19–32)
Calcium: 9.9 mg/dL (ref 8.4–10.5)
Chloride: 105 mEq/L (ref 96–112)
Creatinine, Ser: 0.73 mg/dL (ref 0.40–1.50)
GFR: 107.06 mL/min (ref >60.00)
Glucose, Bld: 196 mg/dL — ABNORMAL HIGH (ref 70–99)
Potassium: 3.9 mEq/L (ref 3.5–5.1)
Sodium: 141 mEq/L (ref 135–145)
Total Bilirubin: 0.5 mg/dL (ref 0.2–1.2)
Total Protein: 7 g/dL (ref 6.0–8.3)

## 2019-10-05 LAB — LIPID PANEL
Cholesterol: 129 mg/dL (ref 0–200)
HDL: 40 mg/dL (ref >39.00)
LDL Cholesterol: 65 mg/dL (ref 0–99)
NonHDL: 89.48
Total CHOL/HDL Ratio: 3
Triglycerides: 120 mg/dL (ref 0.0–149.0)
VLDL: 24 mg/dL (ref 0.0–40.0)

## 2019-10-05 LAB — TSH: TSH: 1.8 u[IU]/mL (ref 0.35–4.50)

## 2019-10-05 LAB — HEMOGLOBIN A1C: Hgb A1c MFr Bld: 8 % — ABNORMAL HIGH (ref 4.6–6.5)

## 2019-10-05 LAB — MICROALBUMIN / CREATININE URINE RATIO
Creatinine,U: 253.7 mg/dL
Microalb Creat Ratio: 1.1 mg/g (ref 0.0–30.0)
Microalb, Ur: 2.8 mg/dL — ABNORMAL HIGH (ref 0.0–1.9)

## 2019-10-05 LAB — T4, FREE: Free T4: 1.46 ng/dL (ref 0.60–1.60)

## 2019-10-05 LAB — PSA: PSA: 0.48 ng/mL (ref 0.10–4.00)

## 2019-10-10 LAB — HM DIABETES EYE EXAM

## 2019-10-11 ENCOUNTER — Other Ambulatory Visit: Payer: Self-pay | Admitting: Family Medicine

## 2019-10-12 ENCOUNTER — Other Ambulatory Visit: Payer: Self-pay

## 2019-10-12 ENCOUNTER — Encounter: Payer: Self-pay | Admitting: Family Medicine

## 2019-10-12 ENCOUNTER — Ambulatory Visit (INDEPENDENT_AMBULATORY_CARE_PROVIDER_SITE_OTHER): Payer: 59 | Admitting: Family Medicine

## 2019-10-12 VITALS — BP 124/82 | HR 88 | Temp 98.2°F | Ht 64.25 in | Wt 169.2 lb

## 2019-10-12 DIAGNOSIS — N529 Male erectile dysfunction, unspecified: Secondary | ICD-10-CM | POA: Diagnosis not present

## 2019-10-12 DIAGNOSIS — Z7189 Other specified counseling: Secondary | ICD-10-CM | POA: Insufficient documentation

## 2019-10-12 DIAGNOSIS — Z0000 Encounter for general adult medical examination without abnormal findings: Secondary | ICD-10-CM

## 2019-10-12 DIAGNOSIS — E785 Hyperlipidemia, unspecified: Secondary | ICD-10-CM

## 2019-10-12 DIAGNOSIS — Z0001 Encounter for general adult medical examination with abnormal findings: Secondary | ICD-10-CM

## 2019-10-12 DIAGNOSIS — E1169 Type 2 diabetes mellitus with other specified complication: Secondary | ICD-10-CM | POA: Diagnosis not present

## 2019-10-12 DIAGNOSIS — E039 Hypothyroidism, unspecified: Secondary | ICD-10-CM

## 2019-10-12 MED ORDER — ATORVASTATIN CALCIUM 20 MG PO TABS: 20.0000 mg | ORAL_TABLET | Freq: Every day | ORAL | 11 refills | Status: DC

## 2019-10-12 MED ORDER — OZEMPIC (1 MG/DOSE) 2 MG/1.5ML ~~LOC~~ SOPN: 1.0000 mg | PEN_INJECTOR | SUBCUTANEOUS | 11 refills | Status: DC

## 2019-10-12 MED ORDER — SILDENAFIL CITRATE 20 MG PO TABS: 20.0000 mg | ORAL_TABLET | Freq: Every day | ORAL | 3 refills | Status: DC | PRN

## 2019-10-12 MED ORDER — LEVOTHYROXINE SODIUM 100 MCG PO TABS: 100.0000 ug | ORAL_TABLET | Freq: Every day | ORAL | 11 refills | Status: DC

## 2019-10-12 NOTE — Progress Notes (Signed)
This visit was conducted in person.  BP 124/82 (BP Location: Left Arm, Patient Position: Sitting, Cuff Size: Normal)   Pulse 88   Temp 98.2 F (36.8 C) (Temporal)   Ht 5' 4.25" (1.632 m)   Wt 169 lb 3 oz (76.7 kg)   SpO2 98%   BMI 28.82 kg/m    CC: CPE Subjective:    Patient ID: Kevin Laible., male    DOB: 1950-11-14, 69 y.o.   MRN: [id number]  HPI: Kevin Shaub. is a 69 y.o. male presenting on 10/12/2019 for Annual Exam   Only medicare part A.   No exam data present    Office Visit from 10/12/2019 in [place] at [facility] Total Score  1      Fall Risk  08/09/2018  Falls in the past year? 0     He tested positive for Covid 09/22/2019. Only symptom was rash then mild cough and sore throat (not outside of normal for his allergies). Symptoms largely resolved.   Preventative: COLONOSCOPY WITH PROPOFOL 08/30/2018 - 2 TA, 1 SSP, diverticulosis, rpt 5 yrs Allen Norris, Darren, MD)  Prostate cancer screening - wants to continue. Discussed spacing out DRE Flu shotyearly  Tdap 2011  Prevnar 05/2014, pneumovax 2019, 2020 (extra dose repeated at pharmacy)  shingrix - completed 2019 Advanced directive - Discussed - has at home but needs updating. Wants wife to be HCPOA. Asked to bring me form.  Seat belt use discussed Sunscreen use discussed. No changing moles. Non smoker - wife smokes indoors. Works on [employer].  Alcohol - only some on weekends  Some MJ - wants to stop  Dentist q6 mo  Eye exam yearly (done Monday)  He lives with wife. 2 dogs. They have 4 grown children and 3 grandchildren  He works on cigarette machines, 3rd shift  Activity: goes to gym and swims regularly - staying active with cardio Diet: good water, fruits/vegetables daily      Relevant past medical, surgical, family and social history reviewed and updated as indicated. Interim medical history since our last visit reviewed. Allergies and medications reviewed and  updated. Outpatient Medications Prior to Visit  Medication Sig Dispense Refill  . ciclopirox (LOPROX) 0.77 % cream Apply topically 2 (two) times daily. 30 g 0  . diclofenac sodium (VOLTAREN) 1 % GEL Apply 1 application topically 3 (three) times daily. 1 Tube 1  . glucose blood (ONE TOUCH ULTRA TEST) test strip Check three times daily and as needed E11.65 100 each 6  . ibuprofen (IBU) 800 MG tablet Take 1 tablet (800 mg total) by mouth 2 (two) times daily as needed. 50 tablet 3  . metFORMIN (GLUCOPHAGE) 1000 MG tablet TAKE ONE TABLET BY MOUTH TWICE A DAY WITH A MEAL 180 tablet 2  . Multiple Vitamins-Minerals (MULTIVITAMIN ADULTS 50+ PO) Take 1 tablet by mouth daily.    . Omega-3 Fatty Acids (FISH OIL) 1000 MG CAPS Take 1 capsule by mouth daily.    . temazepam (RESTORIL) 15 MG capsule Take 1 capsule (15 mg total) by mouth at bedtime as needed for sleep. 30 capsule 2  . atorvastatin (LIPITOR) 20 MG tablet TAKE ONE TABLET BY MOUTH DAILY 30 tablet 0  . levothyroxine (SYNTHROID) 100 MCG tablet TAKE ONE TABLET BY MOUTH EVERY MORNING BEFORE BREAKFAST 30 tablet 2  . OZEMPIC, 0.25 OR 0.5 MG/DOSE, 2 MG/1.5ML SOPN Inject 0.5 mg as directed once a week.    . sildenafil (REVATIO) 20 MG tablet TAKE  2-5 TABLETS BY MOUTH DAILY AS NEEDED 30 tablet 0   No facility-administered medications prior to visit.     Per HPI unless specifically indicated in ROS section below Review of Systems  Constitutional: Negative for activity change, appetite change, chills, fatigue, fever and unexpected weight change.  HENT: Negative for hearing loss.   Eyes: Negative for visual disturbance.  Respiratory: Negative for cough, chest tightness, shortness of breath and wheezing.   Cardiovascular: Negative for chest pain, palpitations and leg swelling.  Gastrointestinal: Negative for abdominal distention, abdominal pain, blood in stool, constipation, diarrhea, nausea and vomiting.  Genitourinary: Negative for difficulty urinating  and hematuria.  Musculoskeletal: Negative for arthralgias, myalgias and neck pain.  Skin: Negative for rash.  Neurological: Negative for dizziness, seizures, syncope and headaches.  Hematological: Negative for adenopathy. Does not bruise/bleed easily.  Psychiatric/Behavioral: Negative for dysphoric mood. The patient is not nervous/anxious.    Objective:    BP 124/82 (BP Location: Left Arm, Patient Position: Sitting, Cuff Size: Normal)   Pulse 88   Temp 98.2 F (36.8 C) (Temporal)   Ht 5' 4.25" (1.632 m)   Wt 169 lb 3 oz (76.7 kg)   SpO2 98%   BMI 28.82 kg/m   Wt Readings from Last 3 Encounters:  10/12/19 169 lb 3 oz (76.7 kg)  06/17/19 175 lb 1 oz (79.4 kg)  03/21/19 174 lb (78.9 kg)    Physical Exam Vitals and nursing note reviewed.  Constitutional:      General: He is not in acute distress.    Appearance: Normal appearance. He is well-developed. He is not ill-appearing.  HENT:     Head: Normocephalic and atraumatic.     Right Ear: Hearing, tympanic membrane, ear canal and external ear normal.     Left Ear: Hearing, tympanic membrane, ear canal and external ear normal.     Mouth/Throat:     Pharynx: Uvula midline.  Eyes:     General: No scleral icterus.    Extraocular Movements: Extraocular movements intact.     Conjunctiva/sclera: Conjunctivae normal.     Pupils: Pupils are equal, round, and reactive to light.  Neck:     Vascular: No carotid bruit.  Cardiovascular:     Rate and Rhythm: Normal rate and regular rhythm.     Pulses: Normal pulses.          Radial pulses are 2+ on the right side and 2+ on the left side.     Heart sounds: Normal heart sounds. No murmur.  Pulmonary:     Effort: Pulmonary effort is normal. No respiratory distress.     Breath sounds: Normal breath sounds. No wheezing, rhonchi or rales.  Abdominal:     General: Abdomen is flat. Bowel sounds are normal. There is no distension.     Palpations: Abdomen is soft. There is no mass.      Tenderness: There is no abdominal tenderness. There is no guarding or rebound.     Hernia: No hernia is present.  Genitourinary:    Prostate: Normal. Not enlarged (20gm), not tender and no nodules present.     Rectum: Normal. No mass, tenderness, anal fissure, external hemorrhoid or internal hemorrhoid. Normal anal tone.  Musculoskeletal:        General: Normal range of motion.     Cervical back: Normal range of motion and neck supple.     Right lower leg: No edema.     Left lower leg: No edema.  Lymphadenopathy:  Cervical: No cervical adenopathy.  Skin:    General: Skin is warm and dry.     Findings: No rash.  Neurological:     General: No focal deficit present.     Mental Status: He is alert and oriented to person, place, and time.     Comments: CN grossly intact, station and gait intact  Psychiatric:        Mood and Affect: Mood normal.        Behavior: Behavior normal.        Thought Content: Thought content normal.        Judgment: Judgment normal.       Results for orders placed or performed in visit on 10/05/19  T4, Free  Result Value Ref Range   Free T4 1.46 0.60 - 1.60 ng/dL  Microalbumin / creatinine urine ratio  Result Value Ref Range   Microalb, Ur 2.8 (H) 0.0 - 1.9 mg/dL   Creatinine,U 253.7 mg/dL   Microalb Creat Ratio 1.1 0.0 - 30.0 mg/g  TSH  Result Value Ref Range   TSH 1.80 0.35 - 4.50 uIU/mL  PSA  Result Value Ref Range   PSA 0.48 0.10 - 4.00 ng/mL  Hemoglobin A1c  Result Value Ref Range   Hgb A1c MFr Bld 8.0 (H) 4.6 - 6.5 %  Comprehensive metabolic panel  Result Value Ref Range   Sodium 141 135 - 145 mEq/L   Potassium 3.9 3.5 - 5.1 mEq/L   Chloride 105 96 - 112 mEq/L   CO2 24 19 - 32 mEq/L   Glucose, Bld 196 (H) 70 - 99 mg/dL   BUN 14 6 - 23 mg/dL   Creatinine, Ser 0.73 0.40 - 1.50 mg/dL   Total Bilirubin 0.5 0.2 - 1.2 mg/dL   Alkaline Phosphatase 88 39 - 117 U/L   AST 31 0 - 37 U/L   ALT 41 0 - 53 U/L   Total Protein 7.0 6.0 - 8.3 g/dL    Albumin 4.6 3.5 - 5.2 g/dL   GFR 107.06 >60.00 mL/min   Calcium 9.9 8.4 - 10.5 mg/dL  Lipid panel  Result Value Ref Range   Cholesterol 129 0 - 200 mg/dL   Triglycerides 120.0 0.0 - 149.0 mg/dL   HDL 40.00 >39.00 mg/dL   VLDL 24.0 0.0 - 40.0 mg/dL   LDL Cholesterol 65 0 - 99 mg/dL   Total CHOL/HDL Ratio 3    NonHDL 89.48    Assessment & Plan:  This visit occurred during the SARS-CoV-2 public health emergency.  Safety protocols were in place, including screening questions prior to the visit, additional usage of staff PPE, and extensive cleaning of exam room while observing appropriate contact time as indicated for disinfecting solutions.   Problem List Items Addressed This Visit    Type 2 diabetes mellitus with other specified complication ([place])    Chronic, deteriorated - will increase ozempic to 1mg  dose - monitor for GI distress, nausea, constipation. Reassess at 3 mo f/u visit.       Relevant Medications   atorvastatin (LIPITOR) 20 MG tablet   Semaglutide, 1 MG/DOSE, (OZEMPIC, 1 MG/DOSE,) 2 MG/1.5ML SOPN   Hypothyroidism    Chronic, stable. Continue current regimen.       Relevant Medications   levothyroxine (SYNTHROID) 100 MCG tablet   Hyperlipidemia associated with type 2 diabetes mellitus (HCC)    Chronic, great control with atorvastatin. Continue. The ASCVD Risk score Mikey Bussing DC Jr., et al., 2013) failed to calculate for the following  reasons:   The valid total cholesterol range is 130 to 320 mg/dL   Unable to determine if patient is Non-Hispanic African American       Relevant Medications   atorvastatin (LIPITOR) 20 MG tablet   Semaglutide, 1 MG/DOSE, (OZEMPIC, 1 MG/DOSE,) 2 MG/1.5ML SOPN   Health maintenance examination    Preventative protocols reviewed and updated unless pt declined. Discussed healthy diet and lifestyle.       Erectile dysfunction    Sildenafil refilled.      Advanced directives, counseling/discussion    Discussed - has at home but needs  updating. Wants wife to be HCPOA. Asked to bring me form.           Meds ordered this encounter  Medications  . sildenafil (REVATIO) 20 MG tablet    Sig: Take 1-5 tablets (20-100 mg total) by mouth daily as needed (relations).    Dispense:  30 tablet    Refill:  3  . levothyroxine (SYNTHROID) 100 MCG tablet    Sig: Take 1 tablet (100 mcg total) by mouth daily with breakfast.    Dispense:  30 tablet    Refill:  11  . atorvastatin (LIPITOR) 20 MG tablet    Sig: Take 1 tablet (20 mg total) by mouth daily.    Dispense:  30 tablet    Refill:  11  . Semaglutide, 1 MG/DOSE, (OZEMPIC, 1 MG/DOSE,) 2 MG/1.5ML SOPN    Sig: Inject 1 mg into the skin once a week.    Dispense:  2 pen    Refill:  11    Note new dose   No orders of the defined types were placed in this encounter.   Patient instructions: Update advanced directive and bring me a copy at your convenience. Increase ozempic to 1mg  - watch for GI side effects. You are doing well today Return as needed or in 3 months for diabetes check.  Follow up plan: Return in about 3 months (around 01/10/2020) for follow up visit.  Ria Bush, MD

## 2019-10-12 NOTE — Assessment & Plan Note (Signed)
Chronic, great control with atorvastatin. Continue. The ASCVD Risk score Kevin Bussing DC Jr., et al., 2013) failed to calculate for the following reasons:   The valid total cholesterol range is 130 to 320 mg/dL   Unable to determine if patient is Non-Hispanic African American

## 2019-10-12 NOTE — Assessment & Plan Note (Signed)
Preventative protocols reviewed and updated unless pt declined. Discussed healthy diet and lifestyle.  

## 2019-10-12 NOTE — Patient Instructions (Addendum)
Update advanced directive and bring me a copy at your convenience. Increase ozempic to 1mg  - watch for GI side effects. You are doing well today Return as needed or in 3 months for diabetes check.  Health Maintenance After Age 68 After age 82, you are at a higher risk for certain long-term diseases and infections as well as injuries from falls. Falls are a major cause of broken bones and head injuries in people who are older than age 80. Getting regular preventive care can help to keep you healthy and well. Preventive care includes getting regular testing and making lifestyle changes as recommended by your health care provider. Talk with your health care provider about:  Which screenings and tests you should have. A screening is a test that checks for a disease when you have no symptoms.  A diet and exercise plan that is right for you. What should I know about screenings and tests to prevent falls? Screening and testing are the best ways to find a health problem early. Early diagnosis and treatment give you the best chance of managing medical conditions that are common after age 22. Certain conditions and lifestyle choices may make you more likely to have a fall. Your health care provider may recommend:  Regular vision checks. Poor vision and conditions such as cataracts can make you more likely to have a fall. If you wear glasses, make sure to get your prescription updated if your vision changes.  Medicine review. Work with your health care provider to regularly review all of the medicines you are taking, including over-the-counter medicines. Ask your health care provider about any side effects that may make you more likely to have a fall. Tell your health care provider if any medicines that you take make you feel dizzy or sleepy.  Osteoporosis screening. Osteoporosis is a condition that causes the bones to get weaker. This can make the bones weak and cause them to break more easily.  Blood  pressure screening. Blood pressure changes and medicines to control blood pressure can make you feel dizzy.  Strength and balance checks. Your health care provider may recommend certain tests to check your strength and balance while standing, walking, or changing positions.  Foot health exam. Foot pain and numbness, as well as not wearing proper footwear, can make you more likely to have a fall.  Depression screening. You may be more likely to have a fall if you have a fear of falling, feel emotionally low, or feel unable to do activities that you used to do.  Alcohol use screening. Using too much alcohol can affect your balance and may make you more likely to have a fall. What actions can I take to lower my risk of falls? General instructions  Talk with your health care provider about your risks for falling. Tell your health care provider if: ? You fall. Be sure to tell your health care provider about all falls, even ones that seem minor. ? You feel dizzy, sleepy, or off-balance.  Take over-the-counter and prescription medicines only as told by your health care provider. These include any supplements.  Eat a healthy diet and maintain a healthy weight. A healthy diet includes low-fat dairy products, low-fat (lean) meats, and fiber from whole grains, beans, and lots of fruits and vegetables. Home safety  Remove any tripping hazards, such as rugs, cords, and clutter.  Install safety equipment such as grab bars in bathrooms and safety rails on stairs.  Keep rooms and walkways well-lit. Activity  Follow a regular exercise program to stay fit. This will help you maintain your balance. Ask your health care provider what types of exercise are appropriate for you.  If you need a cane or walker, use it as recommended by your health care provider.  Wear supportive shoes that have nonskid soles. Lifestyle  Do not drink alcohol if your health care provider tells you not to drink.  If you  drink alcohol, limit how much you have: ? 0-1 drink a day for women. ? 0-2 drinks a day for men.  Be aware of how much alcohol is in your drink. In the [place] one drink equals one typical bottle of beer (12 oz), one-half glass of wine (5 oz), or one shot of hard liquor (1 oz).  Do not use any products that contain nicotine or tobacco, such as cigarettes and e-cigarettes. If you need help quitting, ask your health care provider. Summary  Having a healthy lifestyle and getting preventive care can help to protect your health and wellness after age 75.  Screening and testing are the best way to find a health problem early and help you avoid having a fall. Early diagnosis and treatment give you the best chance for managing medical conditions that are more common for people who are older than age 64.  Falls are a major cause of broken bones and head injuries in people who are older than age 32. Take precautions to prevent a fall at home.  Work with your health care provider to learn what changes you can make to improve your health and wellness and to prevent falls. This information is not intended to replace advice given to you by your health care provider. Make sure you discuss any questions you have with your health care provider. Document Revised: 12/23/2018 Document Reviewed: 07/15/2017 Elsevier Patient Education  2020 [employer].

## 2019-10-12 NOTE — Assessment & Plan Note (Addendum)
Chronic, deteriorated - will increase ozempic to 1mg  dose - monitor for GI distress, nausea, constipation. Reassess at 3 mo f/u visit.

## 2019-10-12 NOTE — Assessment & Plan Note (Signed)
Sildenafil refilled

## 2019-10-12 NOTE — Assessment & Plan Note (Signed)
Chronic, stable. Continue current regimen. 

## 2019-10-12 NOTE — Assessment & Plan Note (Signed)
Discussed - has at home but needs updating. Wants wife to be HCPOA. Asked to bring me form.

## 2019-10-18 ENCOUNTER — Other Ambulatory Visit: Payer: Self-pay | Admitting: Family Medicine

## 2019-10-19 ENCOUNTER — Encounter: Payer: Self-pay | Admitting: Family Medicine

## 2019-10-19 DIAGNOSIS — N529 Male erectile dysfunction, unspecified: Secondary | ICD-10-CM

## 2019-10-20 ENCOUNTER — Encounter: Payer: Self-pay | Admitting: Family Medicine

## 2019-10-20 NOTE — Telephone Encounter (Signed)
Patient called.  Patient would like the referral as soon as possible. Patient prefers an appointment early appointment because he works 3rd shift or after 2:00.

## 2019-10-21 MED ORDER — TADALAFIL 10 MG PO TABS: 10.0000 mg | ORAL_TABLET | ORAL | 3 refills | Status: DC | PRN

## 2019-10-21 NOTE — Telephone Encounter (Signed)
See other mychart message.  Referral placed.

## 2019-10-30 ENCOUNTER — Other Ambulatory Visit: Payer: Self-pay | Admitting: Family Medicine

## 2019-10-31 ENCOUNTER — Other Ambulatory Visit: Payer: Self-pay | Admitting: Lab

## 2019-10-31 MED ORDER — TEMAZEPAM 15 MG PO CAPS: 15.0000 mg | ORAL_CAPSULE | Freq: Every evening | ORAL | 2 refills | Status: DC | PRN

## 2019-10-31 NOTE — Telephone Encounter (Signed)
ERx 

## 2019-11-23 ENCOUNTER — Other Ambulatory Visit: Payer: Self-pay

## 2019-11-23 ENCOUNTER — Encounter: Payer: Self-pay | Admitting: Urology

## 2019-11-23 ENCOUNTER — Ambulatory Visit: Payer: 59 | Admitting: Urology

## 2019-11-23 VITALS — BP 122/75 | HR 76 | Ht 65.0 in | Wt 168.0 lb

## 2019-11-23 DIAGNOSIS — N529 Male erectile dysfunction, unspecified: Secondary | ICD-10-CM | POA: Diagnosis not present

## 2019-11-23 DIAGNOSIS — Q550 Absence and aplasia of testis: Secondary | ICD-10-CM | POA: Diagnosis not present

## 2019-11-23 NOTE — Progress Notes (Signed)
11/23/2019 10:19 AM   Kevin Sheppard. 1951-01-06 [id number]  Referring provider: Ria Bush, MD [address] [place],  [place] [address]  Chief Complaint  Patient presents with  . Erectile Dysfunction    HPI: Ovadia, Toste. is a 69 yo male seen at the request of Dr. Sherrie Sport for evaluation of erectile dysfunction.  -2-3 year history of difficulty achieving and maintaining an erection -Partial erections firm enough for penetration <50 % of time -Only able to maintain erection <50% of time -No pain or curvature -SHIM 11/25 indicating moderate ED -Risk factors diabetes, hyperlipidemia -Trial sildenafil to 100 mg partially effective -Given Rx tadalafil 10 mg qod but has not had intercourse since starting -History left testicular trauma age 66-9; hospitalized 2-3 days; unclear if testis was removed, undescended or atrophic   PMH: Past Medical History:  Diagnosis Date  . Abnormal drug screen 8/2-05/2015   +MJ pt states one time thing (04/2015)  . COVID-19 virus infection 09/2019  . Diabetes type 2, controlled ([place])   . History of chicken pox   . History of colon polyps    benign  . HLD (hyperlipidemia)   . Hypothyroid   . 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 COLONOSCOPY  2004;2009   first with b9 polyps, then diverticulosis no polyps, rpt 21yrs ([facility])  . COLONOSCOPY WITH PROPOFOL N/A 08/30/2018   2 TA, 1 SSP, diverticulosis, rpt 5 yrs Allen Norris, Darren, MD)  . MENISCUS REPAIR Bilateral 2002;2008  . POLYPECTOMY N/A 08/30/2018   Procedure: POLYPECTOMY;  Surgeon: Lucilla Lame, MD;  Location: [facility];  Service: Endoscopy;  Laterality: N/A;    Home Medications:  Allergies as of 11/23/2019      Reactions   Lyrica [pregabalin] Rash      Medication List       Accurate as of November 23, 2019 10:19 AM. If you have any questions, ask your nurse or doctor.        atorvastatin 20 MG tablet Commonly known as: LIPITOR Take 1 tablet (20 mg total) by mouth daily.   ciclopirox 0.77 % cream Commonly known as: LOPROX Apply topically 2 (two) times daily.   diclofenac sodium 1 % Gel Commonly known as: VOLTAREN Apply 1 application topically 3 (three) times daily.   Fish Oil 1000 MG Caps Take 1 capsule by mouth daily.   glucose blood test strip Commonly known as: ONE TOUCH ULTRA TEST Check three times daily and as needed E11.65   ibuprofen 800 MG tablet Commonly known as: IBU Take 1 tablet (800 mg total) by mouth 2 (two) times daily as needed.   levothyroxine 100 MCG tablet Commonly known as: SYNTHROID Take 1 tablet (100 mcg total) by mouth daily with breakfast.   metFORMIN 1000 MG tablet Commonly known as: GLUCOPHAGE TAKE ONE TABLET BY MOUTH TWICE A DAY WITH A MEAL   MULTIVITAMIN ADULTS 50+ PO Take 1 tablet by mouth daily.   Ozempic (1 MG/DOSE) 2 MG/1.5ML Sopn Generic drug: Semaglutide (1 MG/DOSE) Inject 1 mg into the skin once a week.   sildenafil 20 MG tablet Commonly known as: REVATIO Take 1-5 tablets (20-100 mg total) by mouth daily as needed (relations).   tadalafil 10 MG tablet Commonly known as: CIALIS Take 1 tablet (10 mg total) by mouth every other day as needed for erectile  dysfunction.   temazepam 15 MG capsule Commonly known as: RESTORIL Take 1 capsule (15 mg total) by mouth at bedtime as needed for sleep.       Allergies:  Allergies  Allergen Reactions  . Lyrica [Pregabalin] Rash    Family History: Family History  Problem Relation Age of Onset  . Stroke Father   . Diabetes Father   . Other Mother        blood disease, died 13 (?porphyria)  . Diabetes type II Brother   . Diabetes type II Maternal Aunt   . Hypertension Sister   . Stroke Sister 73  . Thyroid disease Sister   . Cancer Neg Hx   . CAD Neg Hx     Social History:   reports that he has never smoked. He has never used smokeless tobacco. He reports current alcohol use of about 6.0 standard drinks of alcohol per week. He reports that he does not use drugs.   Physical Exam: BP 122/75   Pulse 76   Ht 5\' 5"  (1.651 m)   Wt 168 lb (76.2 kg)   BMI 27.96 kg/m   Constitutional:  Alert and oriented, No acute distress. HEENT: [place] AT, moist mucus membranes.  Trachea midline, no masses. Cardiovascular: No clubbing, cyanosis, or edema. Respiratory: Normal respiratory effort, no increased work of breathing. GI: Abdomen is soft, nontender, nondistended, no abdominal masses GU: Phallus uncircumcised without lesions/plaques.  Right testis descended without mass, ~20 cc volume.  Left testis not palpable Skin: No rashes, bruises or suspicious lesions. Neurologic: Grossly intact, no focal deficits, moving all 4 extremities. Psychiatric: Normal mood and affect.  Laboratory Data:  Lab Results  Component Value Date   PSA 0.48 10/05/2019   PSA 0.43 08/02/2018   PSA 0.43 05/27/2017    Assessment & Plan:    - Erectile dysfunction He only has intercourse once a week and would recommend changing tadalafil to 10 mg 1 hour prior to intercourse.  If this is not effective can increase to 20 mg.  If tadalafil is not effective we discussed other options including intracavernosal injections and vacuum erection devices.  Penile implant surgery was briefly discussed.  He was given literature on intracavernosal injections.  - Absent left testis Check serum testosterone level and [place], MD  [facility] [address], [place], [place] [address] [phone]

## 2019-11-24 ENCOUNTER — Telehealth: Payer: Self-pay | Admitting: *Deleted

## 2019-11-24 LAB — LUTEINIZING HORMONE: LH: 7 m[IU]/mL (ref 1.7–8.6)

## 2019-11-24 LAB — TESTOSTERONE: Testosterone: 380 ng/dL (ref 264–916)

## 2019-11-24 NOTE — Telephone Encounter (Signed)
-----   Message from Abbie Sons, MD sent at 11/24/2019  8:25 AM EST ----- Testosterone level was normal at 380

## 2019-11-24 NOTE — Telephone Encounter (Signed)
Notified patient as instructed, patient pleased. Discussed follow-up appointments, patient agrees  

## 2020-01-05 ENCOUNTER — Other Ambulatory Visit: Payer: Self-pay | Admitting: Family Medicine

## 2020-01-13 ENCOUNTER — Ambulatory Visit: Payer: 59 | Admitting: Family Medicine

## 2020-01-23 ENCOUNTER — Ambulatory Visit: Payer: 59 | Admitting: Family Medicine

## 2020-01-23 DIAGNOSIS — Z0289 Encounter for other administrative examinations: Secondary | ICD-10-CM

## 2020-02-16 ENCOUNTER — Other Ambulatory Visit: Payer: Self-pay

## 2020-02-16 ENCOUNTER — Emergency Department
Admission: EM | Admit: 2020-02-16 | Discharge: 2020-02-16 | Disposition: A | Discharge status: Home / Self Care | Payer: 59 | Attending: Student in an Organized Health Care Education/Training Program | Admitting: Student in an Organized Health Care Education/Training Program

## 2020-02-16 ENCOUNTER — Emergency Department: Payer: 59

## 2020-02-16 DIAGNOSIS — Z8616 Personal history of COVID-19: Secondary | ICD-10-CM | POA: Diagnosis not present

## 2020-02-16 DIAGNOSIS — Z7984 Long term (current) use of oral hypoglycemic drugs: Secondary | ICD-10-CM | POA: Insufficient documentation

## 2020-02-16 DIAGNOSIS — R1031 Right lower quadrant pain: Secondary | ICD-10-CM | POA: Insufficient documentation

## 2020-02-16 DIAGNOSIS — Z79899 Other long term (current) drug therapy: Secondary | ICD-10-CM | POA: Insufficient documentation

## 2020-02-16 DIAGNOSIS — E039 Hypothyroidism, unspecified: Secondary | ICD-10-CM | POA: Insufficient documentation

## 2020-02-16 DIAGNOSIS — E119 Type 2 diabetes mellitus without complications: Secondary | ICD-10-CM | POA: Insufficient documentation

## 2020-02-16 DIAGNOSIS — R112 Nausea with vomiting, unspecified: Secondary | ICD-10-CM | POA: Diagnosis not present

## 2020-02-16 DIAGNOSIS — N200 Calculus of kidney: Secondary | ICD-10-CM

## 2020-02-16 LAB — URINALYSIS, COMPLETE (UACMP) WITH MICROSCOPIC
Bacteria, UA: NONE SEEN
Bilirubin Urine: NEGATIVE
Glucose, UA: >=500 mg/dL — AB
Ketones, ur: 20 mg/dL — AB
Leukocytes,Ua: NEGATIVE
Nitrite: NEGATIVE
Protein, ur: 30 mg/dL — AB
Specific Gravity, Urine: 1.028 (ref 1.005–1.030)
Squamous Epithelial / HPF: NONE SEEN (ref 0–5)
pH: 5 (ref 5.0–8.0)

## 2020-02-16 LAB — COMPREHENSIVE METABOLIC PANEL
ALT: 53 U/L — ABNORMAL HIGH (ref 0–44)
AST: 36 U/L (ref 15–41)
Albumin: 5.1 g/dL — ABNORMAL HIGH (ref 3.5–5.0)
Alkaline Phosphatase: 79 U/L (ref 38–126)
Anion gap: 13 (ref 5–15)
BUN: 16 mg/dL (ref 8–23)
CO2: 25 mmol/L (ref 22–32)
Calcium: 10.7 mg/dL — ABNORMAL HIGH (ref 8.9–10.3)
Chloride: 100 mmol/L (ref 98–111)
Creatinine, Ser: 0.94 mg/dL (ref 0.61–1.24)
GFR calc Af Amer: >60 mL/min (ref >60)
GFR calc non Af Amer: >60 mL/min (ref >60)
Glucose, Bld: 303 mg/dL — ABNORMAL HIGH (ref 70–99)
Potassium: 4.2 mmol/L (ref 3.5–5.1)
Sodium: 138 mmol/L (ref 135–145)
Total Bilirubin: 1.3 mg/dL — ABNORMAL HIGH (ref 0.3–1.2)
Total Protein: 7.8 g/dL (ref 6.5–8.1)

## 2020-02-16 LAB — GLUCOSE, CAPILLARY: Glucose-Capillary: 285 mg/dL — ABNORMAL HIGH (ref 70–99)

## 2020-02-16 LAB — CBC
HCT: 44.2 % (ref 39.0–52.0)
Hemoglobin: 15.7 g/dL (ref 13.0–17.0)
MCH: 33.3 pg (ref 26.0–34.0)
MCHC: 35.5 g/dL (ref 30.0–36.0)
MCV: 93.8 fL (ref 80.0–100.0)
Platelets: 212 10*3/uL (ref 150–400)
RBC: 4.71 MIL/uL (ref 4.22–5.81)
RDW: 11.9 % (ref 11.5–15.5)
WBC: 11.1 10*3/uL — ABNORMAL HIGH (ref 4.0–10.5)
nRBC: 0 % (ref 0.0–0.2)

## 2020-02-16 LAB — LIPASE, BLOOD: Lipase: 49 U/L (ref 11–51)

## 2020-02-16 MED ORDER — ONDANSETRON 4 MG PO TBDP
ORAL_TABLET | ORAL | Status: AC
  Filled 2020-02-16: qty 1

## 2020-02-16 MED ORDER — SODIUM CHLORIDE 0.9 % IV BOLUS
500.0000 mL | Freq: Once | INTRAVENOUS | Status: AC
  Administered 2020-02-16: 500 mL via INTRAVENOUS

## 2020-02-16 MED ORDER — TAMSULOSIN HCL 0.4 MG PO CAPS
0.4000 mg | ORAL_CAPSULE | Freq: Every day | ORAL | 0 refills | Status: DC
Start: 2020-02-16 — End: 2020-11-13

## 2020-02-16 MED ORDER — ONDANSETRON 4 MG PO TBDP
4.0000 mg | ORAL_TABLET | Freq: Once | ORAL | Status: AC | PRN
  Administered 2020-02-16: 4 mg via ORAL

## 2020-02-16 MED ORDER — IOHEXOL 300 MG/ML  SOLN
100.0000 mL | Freq: Once | INTRAMUSCULAR | Status: AC | PRN
  Administered 2020-02-16: 100 mL via INTRAVENOUS
  Filled 2020-02-16: qty 100

## 2020-02-16 MED ORDER — KETOROLAC TROMETHAMINE 30 MG/ML IJ SOLN
15.0000 mg | Freq: Once | INTRAMUSCULAR | Status: AC
  Administered 2020-02-16: 15 mg via INTRAVENOUS
  Filled 2020-02-16: qty 1

## 2020-02-16 MED ORDER — OXYCODONE-ACETAMINOPHEN 5-325 MG PO TABS
1.0000 | ORAL_TABLET | ORAL | Status: DC | PRN
  Administered 2020-02-16: 1 via ORAL
  Filled 2020-02-16: qty 1

## 2020-02-16 MED ORDER — HYDROCODONE-ACETAMINOPHEN 5-325 MG PO TABS: 1.0000 | ORAL_TABLET | ORAL | 0 refills | Status: DC | PRN

## 2020-02-16 MED ORDER — SODIUM CHLORIDE 0.9% FLUSH: 3.0000 mL | Freq: Once | INTRAVENOUS | Status: DC

## 2020-02-16 MED ORDER — ONDANSETRON 4 MG PO TBDP: 4.0000 mg | ORAL_TABLET | Freq: Three times a day (TID) | ORAL | 0 refills | Status: DC | PRN

## 2020-02-16 NOTE — ED Triage Notes (Signed)
Pt c/o RLQ pain since last night with N/V.Marland Kitchen states he had it earlier in the week and it went away after taking pepto.

## 2020-02-16 NOTE — ED Provider Notes (Signed)
[facility] Emergency Department Provider Note    First MD Initiated Contact with Patient 02/16/20 1200     (approximate)  I have reviewed the triage vital signs and the nursing notes.   HISTORY  Chief Complaint Abdominal Pain    HPI Kevin Jarbo. is a 69 y.o. male   presents to the ER for evaluation of progressively worsening right-sided abdominal pain associated nausea and vomiting.  States that he thought that he ate some old coleslaw that upset his stomach and was worried that he had foodborne illness but did not ever have any diarrhea.  States he took some Pepto-Bismol with improvement but then started having worsening pain with recurrent nausea and vomiting today.  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   Undescended left testicle 1961   after trauma with dodgeball   Family History  Problem Relation Age of Onset   Stroke Father    Diabetes Father    Other Mother        blood disease, died 18 (?porphyria)   Diabetes type II Brother    Diabetes type II Maternal Aunt    Hypertension Sister    Stroke Sister 58   Thyroid disease Sister    Cancer Neg Hx    CAD Neg Hx    Past Surgical History:  Procedure Laterality Date   COLONOSCOPY  2004;2009   first with b9 polyps, then diverticulosis no polyps, rpt 33yrs ([facility])   COLONOSCOPY WITH PROPOFOL N/A 08/30/2018   2 TA, 1 SSP, diverticulosis, rpt 5 yrs Allen Norris, Evangeline Gula, MD)   MENISCUS REPAIR Bilateral [phone]   POLYPECTOMY N/A  08/30/2018   Procedure: POLYPECTOMY;  Surgeon: Lucilla Lame, MD;  Location: [place];  Service: Endoscopy;  Laterality: N/A;   Patient Active Problem List   Diagnosis Date Noted   Advanced directives, counseling/discussion 10/12/2019   Personal history of colonic polyps    Polyp of sigmoid colon    Benign neoplasm of descending colon    Benign neoplasm of cecum    Right shoulder pain 08/09/2018   Left anterior knee pain 06/01/2017   Overweight with body mass index (BMI) 25.0-29.9 06/01/2017   Health maintenance examination 03/13/2015   Undescended left testicle    Benign skin lesion of nose 10/30/2014   Erectile dysfunction 10/30/2014   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 Authorizing Provider  atorvastatin (LIPITOR) 20 MG tablet Take 1 tablet (20 mg total) by mouth daily. 10/12/19   Ria Bush, MD  ciclopirox (LOPROX) 0.77 % cream Apply topically 2 (two) times daily. 10/24/18   Ria Bush, MD  diclofenac sodium (VOLTAREN) 1 % GEL Apply 1 application topically 3 (three) times daily.  08/09/18   Ria Bush, MD  glucose blood (ONE TOUCH ULTRA TEST) test strip Check three times daily and as needed E11.65 07/03/17   Ria Bush, MD  HYDROcodone-acetaminophen [facility]) 5-325 MG tablet Take 1 tablet by mouth every 4 (four) hours as needed for moderate pain. 02/16/20   Merlyn Lot, MD  ibuprofen (IBU) 800 MG tablet Take 1 tablet (800 mg total) by mouth 2 (two) times daily as needed. 08/09/18   Ria Bush, MD  levothyroxine (SYNTHROID) 100 MCG tablet Take 1 tablet (100 mcg total) by mouth daily with breakfast. 10/12/19   Ria Bush, MD    metFORMIN (GLUCOPHAGE) 1000 MG tablet TAKE ONE TABLET BY MOUTH TWICE A DAY WITH A MEAL 09/13/19   Ria Bush, MD  Multiple Vitamins-Minerals (MULTIVITAMIN ADULTS 50+ PO) Take 1 tablet by mouth daily.    [provider]  Omega-3 Fatty Acids (FISH OIL) 1000 MG CAPS Take 1 capsule by mouth daily.    [provider]  ondansetron (ZOFRAN ODT) 4 MG disintegrating tablet Take 1 tablet (4 mg total) by mouth every 8 (eight) hours as needed for nausea or vomiting. 02/16/20   Merlyn Lot, MD  Semaglutide, 1 MG/DOSE, (OZEMPIC, 1 MG/DOSE,) 2 MG/1.5ML SOPN Inject 1 mg into the skin once a week. 10/12/19   Ria Bush, MD  sildenafil (REVATIO) 20 MG tablet Take 1-5 tablets (20-100 mg total) by mouth daily as needed (relations). 10/12/19   Ria Bush, MD  tadalafil (CIALIS) 10 MG tablet TAKE ONE TABLET BY MOUTH EVERY OTHER DAY AS NEEDED FOR ERECTILE DYSFUNCTION - TO REPLACE SILDENAFIL 01/05/20   Ria Bush, MD  tamsulosin (FLOMAX) 0.4 MG CAPS capsule Take 1 capsule (0.4 mg total) by mouth daily after supper. 02/16/20   Merlyn Lot, MD  temazepam (RESTORIL) 15 MG capsule Take 1 capsule (15 mg total) by mouth at bedtime as needed for sleep. 10/31/19   Ria Bush, MD    Allergies Lyrica [pregabalin]    Social History Social History   Tobacco Use   Smoking status: Never Smoker   Smokeless tobacco: Never Used  Substance Use Topics   Alcohol use: Yes    Alcohol/week: 6.0 standard drinks    Types: 6 Cans of beer per week    Comment:     Drug use: No    Review of Systems Patient denies headaches, rhinorrhea, blurry vision, numbness, shortness of breath, chest pain, edema, cough, abdominal pain, nausea, vomiting, diarrhea, dysuria, fevers, rashes or hallucinations unless otherwise stated above in HPI. ____________________________________________   PHYSICAL EXAM:  VITAL SIGNS: Vitals:   02/16/20 1014 02/16/20 1218  BP: (!) 154/121 (!) 122/91   Pulse: 66 75  Resp: 18 20  Temp: 98.4 F (36.9 C)   SpO2: 97% 98%    Constitutional: Alert and oriented.  Eyes: Conjunctivae are normal.  Head: Atraumatic. Nose: No congestion/rhinnorhea. Mouth/Throat: Mucous membranes are moist.   Neck: No stridor. Painless ROM.  Cardiovascular: Normal rate, regular rhythm. Grossly normal heart sounds.  Good peripheral circulation. Respiratory: Normal respiratory effort.  No retractions. Lungs CTAB. Gastrointestinal: Soft with rlq ttp and discomfort. No distention. No abdominal bruits. No CVA tenderness. Genitourinary:  Musculoskeletal: No lower extremity tenderness nor edema.  No joint effusions. Neurologic:  Normal speech and language. No gross focal neurologic deficits are appreciated. No facial droop Skin:  Skin is warm, dry and intact. No rash noted. Psychiatric: Mood and affect are normal. Speech and behavior are normal.  ____________________________________________   LABS (all labs ordered are  listed, but only abnormal results are displayed)  Results for orders placed or performed during the hospital encounter of 02/16/20 (from the past 24 hour(s))  Glucose, capillary     Status: Abnormal   Collection Time: 02/16/20 10:13 AM  Result Value Ref Range   Glucose-Capillary 285 (H) 70 - 99 mg/dL  Lipase, blood     Status: None   Collection Time: 02/16/20 10:15 AM  Result Value Ref Range   Lipase 49 11 - 51 U/L  Comprehensive metabolic panel     Status: Abnormal   Collection Time: 02/16/20 10:15 AM  Result Value Ref Range   Sodium 138 135 - 145 mmol/L   Potassium 4.2 3.5 - 5.1 mmol/L   Chloride 100 98 - 111 mmol/L   CO2 25 22 - 32 mmol/L   Glucose, Bld 303 (H) 70 - 99 mg/dL   BUN 16 8 - 23 mg/dL   Creatinine, Ser 0.94 0.61 - 1.24 mg/dL   Calcium 10.7 (H) 8.9 - 10.3 mg/dL   Total Protein 7.8 6.5 - 8.1 g/dL   Albumin 5.1 (H) 3.5 - 5.0 g/dL   AST 36 15 - 41 U/L   ALT 53 (H) 0 - 44 U/L   Alkaline Phosphatase 79 38 - 126 U/L   Total  Bilirubin 1.3 (H) 0.3 - 1.2 mg/dL   GFR calc non Af Amer >60 >60 mL/min   GFR calc Af Amer >60 >60 mL/min   Anion gap 13 5 - 15  CBC     Status: Abnormal   Collection Time: 02/16/20 10:15 AM  Result Value Ref Range   WBC 11.1 (H) 4.0 - 10.5 K/uL   RBC 4.71 4.22 - 5.81 MIL/uL   Hemoglobin 15.7 13.0 - 17.0 g/dL   HCT 44.2 39.0 - 52.0 %   MCV 93.8 80.0 - 100.0 fL   MCH 33.3 26.0 - 34.0 pg   MCHC 35.5 30.0 - 36.0 g/dL   RDW 11.9 11.5 - 15.5 %   Platelets 212 150 - 400 K/uL   nRBC 0.0 0.0 - 0.2 %  Urinalysis, Complete w Microscopic     Status: Abnormal   Collection Time: 02/16/20 10:15 AM  Result Value Ref Range   Color, Urine YELLOW (A) YELLOW   APPearance HAZY (A) CLEAR   Specific Gravity, Urine 1.028 1.005 - 1.030   pH 5.0 5.0 - 8.0   Glucose, UA >=500 (A) NEGATIVE mg/dL   Hgb urine dipstick LARGE (A) NEGATIVE   Bilirubin Urine NEGATIVE NEGATIVE   Ketones, ur 20 (A) NEGATIVE mg/dL   Protein, ur 30 (A) NEGATIVE mg/dL   Nitrite NEGATIVE NEGATIVE   Leukocytes,Ua NEGATIVE NEGATIVE   RBC / HPF 21-50 0 - 5 RBC/hpf   WBC, UA 0-5 0 - 5 WBC/hpf   Bacteria, UA NONE SEEN NONE SEEN   Squamous Epithelial / LPF NONE SEEN 0 - 5   Mucus PRESENT    ____________________________________________ ____________________________________________  RADIOLOGY  I personally reviewed all radiographic images ordered to evaluate for the above acute complaints and reviewed radiology reports and findings.  These findings were personally discussed with the patient.  Please see medical record for radiology report.  ____________________________________________   PROCEDURES  Procedure(s) performed:  Procedures    Critical Care performed: no ____________________________________________   INITIAL IMPRESSION / ASSESSMENT AND PLAN / ED COURSE  Pertinent labs & imaging results that were available during my care of the patient were reviewed by me and considered in my medical decision making (see  chart  for details).   DDX: appendicitis, cholelithiasis, cholecystitis, pancreatitis, stone, colitis, diverticulitis  Kevin Zingsheim. is a 69 y.o. who presents to the ED with symptoms as described above.  Mild white count.  No reported fevers but location of pain will order CT imaging for above differential will provide IV fluids as well as IV pain and antiemetics.  Will reassess.  Clinical Course as of Feb 16 1320  Thu Feb 16, 2020  1318 Patient reassessed.  Does have evidence of right-sided 4 mm stone.  Urinalysis without any evidence of infection.  He is not febrile.  When she the patient is tolerating p.o. pain is controlled we discussed outpatient follow-up and signs symptoms which the patient should return to the ER.   [PR]    Clinical Course User Index [PR] Merlyn Lot, MD    The patient was evaluated in Emergency Department today for the symptoms described in the history of present illness. He/she was evaluated in the context of the global COVID-19 pandemic, which necessitated consideration that the patient might be at risk for infection with the SARS-CoV-2 virus that causes COVID-19. Institutional protocols and algorithms that pertain to the evaluation of patients at risk for COVID-19 are in a state of rapid change based on information released by regulatory bodies including the CDC and federal and state organizations. These policies and algorithms were followed during the patient's care in the ED.  As part of my medical decision making, I reviewed the following data within the [place] notes reviewed and incorporated, Labs reviewed, notes from prior ED visits and [place] Controlled Substance Database   ____________________________________________   FINAL CLINICAL IMPRESSION(S) / ED DIAGNOSES  Final diagnoses:  Right lower quadrant abdominal pain  Kidney stone      NEW MEDICATIONS STARTED DURING THIS VISIT:  New Prescriptions    HYDROCODONE-ACETAMINOPHEN (NORCO) 5-325 MG TABLET    Take 1 tablet by mouth every 4 (four) hours as needed for moderate pain.   ONDANSETRON (ZOFRAN ODT) 4 MG DISINTEGRATING TABLET    Take 1 tablet (4 mg total) by mouth every 8 (eight) hours as needed for nausea or vomiting.   TAMSULOSIN (FLOMAX) 0.4 MG CAPS CAPSULE    Take 1 capsule (0.4 mg total) by mouth daily after supper.     Note:  This document was prepared using Dragon voice recognition software and may include unintentional dictation errors.    Merlyn Lot, MD 02/16/20 1322

## 2020-02-16 NOTE — Discharge Instructions (Signed)

## 2020-02-16 NOTE — ED Notes (Signed)
Pt present c/o ruq abd pain for the past few days.  +n/v a few days ago.  Pain increased again last pm after eating, tender on palpation in ruq.  Denies constipation/diarrhea.  Prior to pain medication pain was 6/10. Medicated from triage, now 1/10.

## 2020-02-23 ENCOUNTER — Other Ambulatory Visit: Payer: Self-pay

## 2020-02-23 ENCOUNTER — Encounter: Payer: Self-pay | Admitting: Family Medicine

## 2020-02-23 ENCOUNTER — Ambulatory Visit (INDEPENDENT_AMBULATORY_CARE_PROVIDER_SITE_OTHER): Payer: 59 | Admitting: Family Medicine

## 2020-02-23 ENCOUNTER — Telehealth: Payer: Self-pay

## 2020-02-23 VITALS — BP 122/72 | HR 68 | Temp 97.5°F | Ht 65.0 in | Wt 171.0 lb

## 2020-02-23 DIAGNOSIS — E1169 Type 2 diabetes mellitus with other specified complication: Secondary | ICD-10-CM

## 2020-02-23 DIAGNOSIS — N132 Hydronephrosis with renal and ureteral calculous obstruction: Secondary | ICD-10-CM

## 2020-02-23 LAB — POCT GLYCOSYLATED HEMOGLOBIN (HGB A1C): Hemoglobin A1C: 8.9 % — AB (ref 4.0–5.6)

## 2020-02-23 MED ORDER — GLIPIZIDE ER 5 MG PO TB24: 5.0000 mg | ORAL_TABLET | Freq: Every day | ORAL | 6 refills | Status: DC

## 2020-02-23 NOTE — Assessment & Plan Note (Signed)
Right sided 30mm kidney stone causing mild obstruction by CT on 02/16/2020. Still with intermittent flares of pain despite flomax and increased fluid intake. Reviewed current regimen - continue flomax, add ibuprofen for pain with hydrocodone reserved for breakthrough pain. Provided with strainer to start straining urine. Discussed urology evaluation if ongoing symptoms into next week given mild hydronephrosis noted. Pt agrees with plan.

## 2020-02-23 NOTE — Telephone Encounter (Signed)
-----   Message from Ria Bush, MD sent at 02/23/2020  2:19 PM EDT ----- Results released via MyChart Plz call - rec add glipizide 5mg  XL 1 tab daily with breakfast to his diabetes regimen. Needs to take this with breakfast. Please schedule 3 mo DM f/u visit .

## 2020-02-23 NOTE — Patient Instructions (Addendum)
Strain urine until kidney stone passes - strainer provided today.   Increase water and fluid intake to help pass stone.  May use ibuprofen as needed for pain, keep hydrocodone as needed for breakthrough pain.  If ongoing pain into next week, schedule follow up with urologist.   A1c today.   Dietary Guidelines to Help Prevent Kidney Stones Kidney stones are deposits of minerals and salts that form inside your kidneys. Your risk of developing kidney stones may be greater depending on your diet, your lifestyle, the medicines you take, and whether you have certain medical conditions. Most people can reduce their chances of developing kidney stones by following the instructions below. Depending on your overall health and the type of kidney stones you tend to develop, your dietitian may give you more specific instructions. What are tips for following this plan? Reading food labels  Choose foods with "no salt added" or "low-salt" labels. Limit your sodium intake to less than 1500 mg per day.  Choose foods with calcium for each meal and snack. Try to eat about 300 mg of calcium at each meal. Foods that contain 200-500 mg of calcium per serving include: ? 8 oz (237 ml) of milk, fortified nondairy milk, and fortified fruit juice. ? 8 oz (237 ml) of kefir, yogurt, and soy yogurt. ? 4 oz (118 ml) of tofu. ? 1 oz of cheese. ? 1 cup (300 g) of dried figs. ? 1 cup (91 g) of cooked broccoli. ? 1-3 oz can of sardines or mackerel.  Most people need 1000 to 1500 mg of calcium each day. Talk to your dietitian about how much calcium is recommended for you. Shopping  Buy plenty of fresh fruits and vegetables. Most people do not need to avoid fruits and vegetables, even if they contain nutrients that may contribute to kidney stones.  When shopping for convenience foods, choose: ? Whole pieces of fruit. ? Premade salads with dressing on the side. ? Low-fat fruit and yogurt smoothies.  Avoid buying frozen  meals or prepared deli foods.  Look for foods with live cultures, such as yogurt and kefir. Cooking  Do not add salt to food when cooking. Place a salt shaker on the table and allow each person to add his or her own salt to taste.  Use vegetable protein, such as beans, textured vegetable protein (TVP), or tofu instead of meat in pasta, casseroles, and soups. Meal planning   Eat less salt, if told by your dietitian. To do this: ? Avoid eating processed or premade food. ? Avoid eating fast food.  Eat less animal protein, including cheese, meat, poultry, or fish, if told by your dietitian. To do this: ? Limit the number of times you have meat, poultry, fish, or cheese each week. Eat a diet free of meat at least 2 days a week. ? Eat only one serving each day of meat, poultry, fish, or seafood. ? When you prepare animal protein, cut pieces into small portion sizes. For most meat and fish, one serving is about the size of one deck of cards.  Eat at least 5 servings of fresh fruits and vegetables each day. To do this: ? Keep fruits and vegetables on hand for snacks. ? Eat 1 piece of fruit or a handful of berries with breakfast. ? Have a salad and fruit at lunch. ? Have two kinds of vegetables at dinner.  Limit foods that are high in a substance called oxalate. These include: ? Spinach. ? Rhubarb. ?  Beets. ? Potato chips and french fries. ? Nuts.  If you regularly take a diuretic medicine, make sure to eat at least 1-2 fruits or vegetables high in potassium each day. These include: ? Avocado. ? Banana. ? Orange, prune, carrot, or tomato juice. ? Baked potato. ? Cabbage. ? Beans and split peas. General instructions   Drink enough fluid to keep your urine clear or pale yellow. This is the most important thing you can do.  Talk to your health care provider and dietitian about taking daily supplements. Depending on your health and the cause of your kidney stones, you may be  advised: ? Not to take supplements with vitamin C. ? To take a calcium supplement. ? To take a daily probiotic supplement. ? To take other supplements such as magnesium, fish oil, or vitamin B6.  Take all medicines and supplements as told by your health care provider.  Limit alcohol intake to no more than 1 drink a day for nonpregnant women and 2 drinks a day for men. One drink equals 12 oz of beer, 5 oz of wine, or 1 oz of hard liquor.  Lose weight if told by your health care provider. Work with your dietitian to find strategies and an eating plan that works best for you. What foods are not recommended? Limit your intake of the following foods, or as told by your dietitian. Talk to your dietitian about specific foods you should avoid based on the type of kidney stones and your overall health. Grains Breads. Bagels. Rolls. Baked goods. Salted crackers. Cereal. Pasta. Vegetables Spinach. Rhubarb. Beets. Canned vegetables. Angie Fava. Olives. Meats and other protein foods Nuts. Nut butters. Large portions of meat, poultry, or fish. Salted or cured meats. Deli meats. Hot dogs. Sausages. Dairy Cheese. Beverages Regular soft drinks. Regular vegetable juice. Seasonings and other foods Seasoning blends with salt. Salad dressings. Canned soups. Soy sauce. Ketchup. Barbecue sauce. Canned pasta sauce. Casseroles. Pizza. Lasagna. Frozen meals. Potato chips. [place] fries. Summary  You can reduce your risk of kidney stones by making changes to your diet.  The most important thing you can do is drink enough fluid. You should drink enough fluid to keep your urine clear or pale yellow.  Ask your health care provider or dietitian how much protein from animal sources you should eat each day, and also how much salt and calcium you should have each day. This information is not intended to replace advice given to you by your health care provider. Make sure you discuss any questions you have with your health  care provider. Document Revised: 12/22/2018 Document Reviewed: 08/12/2016 Elsevier Patient Education  2020 [employer].

## 2020-02-23 NOTE — Telephone Encounter (Signed)
Left detailed message for patient regarding results and recommendations. 

## 2020-02-23 NOTE — Assessment & Plan Note (Addendum)
Deteriorated control despite 1mg  ozempic with metformin 1000mg  bid - will add glipizide 5mg  XL with breakfast and rec RTC 3 mo f/u visit.

## 2020-02-23 NOTE — Progress Notes (Signed)
This visit was conducted in person.  BP 122/72 (BP Location: Left Arm, Patient Position: Sitting, Cuff Size: Normal)   Pulse 68   Temp (!) 97.5 F (36.4 C) (Temporal)   Ht 5\' 5"  (1.651 m)   Wt 171 lb (77.6 kg)   SpO2 95%   BMI 28.46 kg/m    CC: ER f/u visit  Subjective:    Patient ID: Kevin Payes., male    DOB: May 31, 1951, 69 y.o.   MRN: [id number]  HPI: Kevin Acebo. is a 69 y.o. male presenting on 02/23/2020 for Hospitalization Follow-up (Seen in [facility] 6/3 for RLQ pain. Pt states that he still has a little pain but has been taking his pain medications  - has only taken 1 since d/c - Hydrocodone 5-325mg . Pt states that two days ago he got home from work and had increased pain and took a Hydrocodone - seemed to help with pain. He was diagnosed with kidney stone)   Seen at ER last week with RLQ pain - records reviewed. CT showed R sided 68mm stone at pelvic brim associated with mild R hydronephrosis. Vomited x1 due to pain vs pepto bismol use. Never had fevers/chills, dysuria. No recent antibiotic use. Treated with percocet and toradol with benefit.  Discharged on 10d flomax, zofran PRN nausea and hydrocodone 5/325.   This is his first kidney stone.   Notes sugars have been running high despite metformin 1000mg  bid and ozempic 1mg  weekly.  Lab Results  Component Value Date   HGBA1C 8.9 (A) 02/23/2020    Lab Results  Component Value Date   TSH 1.80 10/05/2019        Relevant past medical, surgical, family and social history reviewed and updated as indicated. Interim medical history since our last visit reviewed. Allergies and medications reviewed and updated. Outpatient Medications Prior to Visit  Medication Sig Dispense Refill  . atorvastatin (LIPITOR) 20 MG tablet Take 1 tablet (20 mg total) by mouth daily. 30 tablet 11  . ciclopirox (LOPROX) 0.77 % cream Apply topically 2 (two) times daily. 30 g 0  . diclofenac sodium (VOLTAREN) 1 % GEL Apply 1 application topically 3  (three) times daily. 1 Tube 1  . glucose blood (ONE TOUCH ULTRA TEST) test strip Check three times daily and as needed E11.65 100 each 6  . HYDROcodone-acetaminophen (NORCO) 5-325 MG tablet Take 1 tablet by mouth every 4 (four) hours as needed for moderate pain. 6 tablet 0  . ibuprofen (IBU) 800 MG tablet Take 1 tablet (800 mg total) by mouth 2 (two) times daily as needed. 50 tablet 3  . levothyroxine (SYNTHROID) 100 MCG tablet Take 1 tablet (100 mcg total) by mouth daily with breakfast. 30 tablet 11  . metFORMIN (GLUCOPHAGE) 1000 MG tablet TAKE ONE TABLET BY MOUTH TWICE A DAY WITH A MEAL 180 tablet 2  . Multiple Vitamins-Minerals (MULTIVITAMIN ADULTS 50+ PO) Take 1 tablet by mouth daily.    . Omega-3 Fatty Acids (FISH OIL) 1000 MG CAPS Take 1 capsule by mouth daily.    . ondansetron (ZOFRAN ODT) 4 MG disintegrating tablet Take 1 tablet (4 mg total) by mouth every 8 (eight) hours as needed for nausea or vomiting. 20 tablet 0  . Semaglutide, 1 MG/DOSE, (OZEMPIC, 1 MG/DOSE,) 2 MG/1.5ML SOPN Inject 1 mg into the skin once a week. 2 pen 11  . sildenafil (REVATIO) 20 MG tablet Take 1-5 tablets (20-100 mg total) by mouth daily as needed (relations). 30 tablet 3  .  tadalafil (CIALIS) 10 MG tablet TAKE ONE TABLET BY MOUTH EVERY OTHER DAY AS NEEDED FOR ERECTILE DYSFUNCTION - TO REPLACE SILDENAFIL 10 tablet 2  . tamsulosin (FLOMAX) 0.4 MG CAPS capsule Take 1 capsule (0.4 mg total) by mouth daily after supper. 10 capsule 0  . temazepam (RESTORIL) 15 MG capsule Take 1 capsule (15 mg total) by mouth at bedtime as needed for sleep. 30 capsule 2   No facility-administered medications prior to visit.     Per HPI unless specifically indicated in ROS section below Review of Systems Objective:  BP 122/72 (BP Location: Left Arm, Patient Position: Sitting, Cuff Size: Normal)   Pulse 68   Temp (!) 97.5 F (36.4 C) (Temporal)   Ht 5\' 5"  (1.651 m)   Wt 171 lb (77.6 kg)   SpO2 95%   BMI 28.46 kg/m   Wt Readings  from Last 3 Encounters:  02/23/20 171 lb (77.6 kg)  02/16/20 170 lb (77.1 kg)  11/23/19 168 lb (76.2 kg)      Physical Exam Vitals and nursing note reviewed.  Constitutional:      Appearance: Normal appearance. He is not ill-appearing.  Cardiovascular:     Rate and Rhythm: Normal rate and regular rhythm.     Pulses: Normal pulses.     Heart sounds: Normal heart sounds. No murmur heard.   Pulmonary:     Effort: Pulmonary effort is normal. No respiratory distress.     Breath sounds: Normal breath sounds. No wheezing, rhonchi or rales.  Abdominal:     General: Abdomen is flat. Bowel sounds are normal. There is no distension.     Palpations: Abdomen is soft. There is no mass.     Tenderness: There is abdominal tenderness (mild) in the right lower quadrant. There is no right CVA tenderness, left CVA tenderness, guarding or rebound. Negative signs include Murphy's sign.     Hernia: No hernia is present.  Musculoskeletal:     Right lower leg: No edema.     Left lower leg: No edema.  Skin:    Findings: No rash.  Neurological:     Mental Status: He is alert.  Psychiatric:        Behavior: Behavior normal.       Results for orders placed or performed in visit on 02/23/20  POCT glycosylated hemoglobin (Hb A1C)  Result Value Ref Range   Hemoglobin A1C 8.9 (A) 4.0 - 5.6 %   HbA1c POC (<> result, manual entry)     HbA1c, POC (prediabetic range)     HbA1c, POC (controlled diabetic range)     Contrasted CT abd/pelvis IMPRESSION: Mild right hydronephrosis due to a 0.4 cm stone at the level of the pelvic brim. Negative for other urinary tract stones. No other acute finding. Fatty infiltration of the liver. Diverticulosis without diverticulitis. Fat containing right inguinal hernia. Very small fat containing umbilical hernia is also present. Aortic Atherosclerosis (ICD10-I70.0). Electronically Signed   By: Inge Rise M.D.   On: 02/16/2020 13:11 Assessment & Plan:  This visit  occurred during the SARS-CoV-2 public health emergency.  Safety protocols were in place, including screening questions prior to the visit, additional usage of staff PPE, and extensive cleaning of exam room while observing appropriate contact time as indicated for disinfecting solutions.   Problem List Items Addressed This Visit    Type 2 diabetes mellitus with other specified complication ([place])    Deteriorated control despite 1mg  ozempic with metformin 1000mg  bid - will add  glipizide 5mg  XL with breakfast and rec RTC 3 mo f/u visit.       Relevant Medications   glipiZIDE (GLUCOTROL XL) 5 MG 24 hr tablet   Other Relevant Orders   POCT glycosylated hemoglobin (Hb A1C) (Completed)   Hydronephrosis with obstructing calculus - Primary    Right sided 30mm kidney stone causing mild obstruction by CT on 02/16/2020. Still with intermittent flares of pain despite flomax and increased fluid intake. Reviewed current regimen - continue flomax, add ibuprofen for pain with hydrocodone reserved for breakthrough pain. Provided with strainer to start straining urine. Discussed urology evaluation if ongoing symptoms into next week given mild hydronephrosis noted. Pt agrees with plan.           Meds ordered this encounter  Medications  . glipiZIDE (GLUCOTROL XL) 5 MG 24 hr tablet    Sig: Take 1 tablet (5 mg total) by mouth daily with breakfast.    Dispense:  30 tablet    Refill:  6   Orders Placed This Encounter  Procedures  . POCT glycosylated hemoglobin (Hb A1C)    Patient instructions: Strain urine until kidney stone passes - strainer provided today.   Increase water and fluid intake to help pass stone.  May use ibuprofen as needed for pain, keep hydrocodone as needed for breakthrough pain.  If ongoing pain into next week, schedule follow up with urologist.   A1c today.   Follow up plan: Return if symptoms worsen or fail to improve.  Ria Bush, MD

## 2020-03-03 ENCOUNTER — Other Ambulatory Visit: Payer: Self-pay | Admitting: Family Medicine

## 2020-03-03 NOTE — Telephone Encounter (Signed)
Last filled 01/05/2020 #10 with 2 refills... please advise

## 2020-03-27 ENCOUNTER — Other Ambulatory Visit: Payer: Self-pay | Admitting: Family Medicine

## 2020-03-27 NOTE — Telephone Encounter (Signed)
Name of Medication: Temazepam Name of Pharmacy: [facility] or Written Date and Quantity: 01/26/20, #30 Last Office Visit and Type: 02/23/20, hosp f/u Next Office Visit and Type: 99/17/21, 3 mo f/u Last Controlled Substance Agreement Date: none Last UDS: none

## 2020-03-27 NOTE — Telephone Encounter (Signed)
ERx 

## 2020-04-15 ENCOUNTER — Other Ambulatory Visit: Payer: Self-pay | Admitting: Family Medicine

## 2020-04-16 NOTE — Telephone Encounter (Signed)
Electronic refill request. Tadalafil Last office visit:   02/23/2020 Last Filled:     10 tablet 1 03/05/2020  Please advise.

## 2020-04-19 ENCOUNTER — Telehealth: Payer: Self-pay | Admitting: Family Medicine

## 2020-04-19 NOTE — Telephone Encounter (Signed)
Please complete PA thanks

## 2020-04-19 NOTE — Telephone Encounter (Signed)
Medication Refill - Medication:  Semaglutide, 1 MG/DOSE, (OZEMPIC, 1 MG/DOSE,) 2 MG/1.5ML SOPN  Has the patient contacted their pharmacy? Yes advised to call office. Patient stated he contacted pharmacy but that they stated a Pa was needed. Please advise.   Preferred Pharmacy (with phone number or street name):  [facility], [place] Phone:  [phone]  Fax:  [phone]

## 2020-04-20 NOTE — Telephone Encounter (Signed)
Received faxed PA request for Ozempic 1 mg.    Submitted PA; key:  [id number], Rx #:  [id number].  Decision pending.

## 2020-04-23 ENCOUNTER — Encounter: Payer: Self-pay | Admitting: Family Medicine

## 2020-04-23 NOTE — Telephone Encounter (Signed)
Received PA approval, valid 04/21/2020- 04/21/2021.  Notified [facility] they will fill for pt.

## 2020-04-24 NOTE — Telephone Encounter (Signed)
Spoke with [facility]pp[facility] yesterday notifying them of PA approval (see TE, 04/19/20).  Spoke with pharmacy again asking about rx.  Says they are getting it filled as soon as they can.   Notified pt of pharmacy's message, via MyChart.

## 2020-05-14 ENCOUNTER — Other Ambulatory Visit: Payer: Self-pay

## 2020-05-14 ENCOUNTER — Emergency Department
Admission: EM | Admit: 2020-05-14 | Discharge: 2020-05-14 | Disposition: A | Discharge status: Home / Self Care | Payer: No Typology Code available for payment source | Attending: Emergency Medicine | Admitting: Emergency Medicine

## 2020-05-14 DIAGNOSIS — E039 Hypothyroidism, unspecified: Secondary | ICD-10-CM | POA: Diagnosis not present

## 2020-05-14 DIAGNOSIS — S60922A Unspecified superficial injury of left hand, initial encounter: Secondary | ICD-10-CM | POA: Diagnosis present

## 2020-05-14 DIAGNOSIS — Z7984 Long term (current) use of oral hypoglycemic drugs: Secondary | ICD-10-CM | POA: Insufficient documentation

## 2020-05-14 DIAGNOSIS — Y9269 Other specified industrial and construction area as the place of occurrence of the external cause: Secondary | ICD-10-CM | POA: Diagnosis not present

## 2020-05-14 DIAGNOSIS — W228XXA Striking against or struck by other objects, initial encounter: Secondary | ICD-10-CM | POA: Insufficient documentation

## 2020-05-14 DIAGNOSIS — Y939 Activity, unspecified: Secondary | ICD-10-CM | POA: Insufficient documentation

## 2020-05-14 DIAGNOSIS — Z79890 Hormone replacement therapy (postmenopausal): Secondary | ICD-10-CM | POA: Diagnosis not present

## 2020-05-14 DIAGNOSIS — E119 Type 2 diabetes mellitus without complications: Secondary | ICD-10-CM | POA: Insufficient documentation

## 2020-05-14 DIAGNOSIS — Z79899 Other long term (current) drug therapy: Secondary | ICD-10-CM | POA: Diagnosis not present

## 2020-05-14 DIAGNOSIS — Z23 Encounter for immunization: Secondary | ICD-10-CM | POA: Diagnosis not present

## 2020-05-14 DIAGNOSIS — S61412A Laceration without foreign body of left hand, initial encounter: Secondary | ICD-10-CM | POA: Insufficient documentation

## 2020-05-14 DIAGNOSIS — Y999 Unspecified external cause status: Secondary | ICD-10-CM | POA: Diagnosis not present

## 2020-05-14 MED ORDER — LIDOCAINE HCL (PF) 1 % IJ SOLN
5.0000 mL | Freq: Once | INTRAMUSCULAR | Status: AC
  Administered 2020-05-14: 5 mL
  Filled 2020-05-14: qty 5

## 2020-05-14 MED ORDER — TRAMADOL HCL 50 MG PO TABS: 50.0000 mg | ORAL_TABLET | Freq: Four times a day (QID) | ORAL | 0 refills | Status: DC | PRN

## 2020-05-14 MED ORDER — BACITRACIN-NEOMYCIN-POLYMYXIN 400-5-5000 EX OINT
TOPICAL_OINTMENT | Freq: Once | CUTANEOUS | Status: AC
  Administered 2020-05-14: 1 via TOPICAL
  Filled 2020-05-14: qty 1

## 2020-05-14 MED ORDER — TETANUS-DIPHTH-ACELL PERTUSSIS 5-2.5-18.5 LF-MCG/0.5 IM SUSP
0.5000 mL | Freq: Once | INTRAMUSCULAR | Status: AC
  Administered 2020-05-14: 0.5 mL via INTRAMUSCULAR
  Filled 2020-05-14: qty 0.5

## 2020-05-14 NOTE — ED Provider Notes (Signed)
[facility] Emergency Department Provider Note   ____________________________________________   First MD Initiated Contact with Patient 05/14/20 [phone]     (approximate)  I have reviewed the triage vital signs and the nursing notes.   HISTORY  Chief Complaint Laceration (Workers Comp)    HPI Kevin Sheppard. is a 69 y.o. male          Past Medical History:  Diagnosis Date  . Abnormal drug screen 8/2-05/2015   +MJ pt states one time thing (04/2015)  . COVID-19 virus infection 09/2019  . Diabetes type 2, controlled ([place])   . History of chicken pox   . History of colon polyps    benign  . HLD (hyperlipidemia)   . Hypothyroid   . 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 Hydronephrosis with obstructing calculus 02/23/2020  . Advanced directives, counseling/discussion 10/12/2019  . Personal history of colonic polyps   . Polyp of sigmoid colon   . Benign neoplasm of descending colon   . Benign neoplasm of cecum   . Right shoulder pain 08/09/2018  . Left anterior knee pain 06/01/2017  . Overweight with body mass index (BMI) 25.0-29.9 06/01/2017  . Health maintenance examination 03/13/2015  . Undescended left testicle   . Benign skin lesion of nose 10/30/2014  . Erectile dysfunction 10/30/2014  . 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 Hyperlipidemia associated with type 2 diabetes mellitus ([place])   . Peripheral neuropathy 12/07/2013  . Hypothyroidism 10/26/2013  . Type 2 diabetes mellitus with other specified complication ([place]) 78/67/6720    Past Surgical History:  Procedure Laterality Date  . COLONOSCOPY  2004;2009   first with b9 polyps, then diverticulosis no polyps, rpt 62yrs ([facility])  . COLONOSCOPY WITH PROPOFOL N/A 08/30/2018   2 TA, 1 SSP, diverticulosis, rpt 5 yrs Allen Norris, Darren, MD)  .  MENISCUS REPAIR Bilateral 2002;2008  . POLYPECTOMY N/A 08/30/2018   Procedure: POLYPECTOMY;  Surgeon: Lucilla Lame, MD;  Location: [facility];  Service: Endoscopy;  Laterality: N/A;    Prior to Admission medications   Medication Sig Start Date End Date Taking? Authorizing Provider  atorvastatin (LIPITOR) 20 MG tablet Take 1 tablet (20 mg total) by mouth daily. 10/12/19   Ria Bush, MD  ciclopirox (LOPROX) 0.77 % cream Apply topically 2 (two) times daily. 10/24/18   Ria Bush, MD  diclofenac sodium (VOLTAREN) 1 % GEL Apply 1 application topically 3 (three) times daily. 08/09/18   Ria Bush, MD  glipiZIDE (GLUCOTROL XL) 5 MG 24 hr tablet Take 1 tablet (5 mg total) by mouth daily with breakfast. 02/23/20   Ria Bush, MD  glucose blood (ONE TOUCH ULTRA TEST) test strip Check three times daily and as needed E11.65 07/03/17   Ria Bush, MD  HYDROcodone-acetaminophen (NORCO) 5-325 MG tablet Take 1 tablet by mouth every 4 (four) hours as needed for moderate pain. 02/16/20   Merlyn Lot, MD  ibuprofen (IBU) 800 MG tablet Take 1 tablet (800 mg total) by mouth 2 (two) times daily as needed. 08/09/18   Ria Bush, MD  levothyroxine (SYNTHROID) 100 MCG tablet Take 1 tablet (100 mcg total) by mouth daily with breakfast. 10/12/19   Ria Bush, MD  metFORMIN (GLUCOPHAGE) 1000 MG tablet TAKE ONE TABLET BY MOUTH TWICE A DAY WITH A MEAL 09/13/19   Ria Bush, MD  Multiple Vitamins-Minerals (MULTIVITAMIN ADULTS 50+ PO) Take 1 tablet by mouth daily.    [provider]  Omega-3 Fatty Acids (FISH OIL) 1000 MG CAPS Take 1 capsule by mouth daily.    [provider]  ondansetron (ZOFRAN ODT) 4 MG disintegrating tablet Take 1 tablet (4 mg total) by mouth every 8 (eight) hours as needed for nausea or vomiting. 02/16/20   Merlyn Lot, MD  Semaglutide, 1 MG/DOSE, (OZEMPIC, 1 MG/DOSE,) 2 MG/1.5ML SOPN Inject 1 mg into the skin once a  week. 10/12/19   Ria Bush, MD  sildenafil (REVATIO) 20 MG tablet Take 1-5 tablets (20-100 mg total) by mouth daily as needed (relations). 10/12/19   Ria Bush, MD  tadalafil (CIALIS) 10 MG tablet TAKE ONE TABLET BY MOUTH EVERY OTHER DAY AS NEEDED FOR ERECTILE DYSFUNCTION (THIS REPLACES SILDENAFIL) 04/16/20   Ria Bush, MD  tamsulosin (FLOMAX) 0.4 MG CAPS capsule Take 1 capsule (0.4 mg total) by mouth daily after supper. 02/16/20   Merlyn Lot, MD  temazepam (RESTORIL) 15 MG capsule TAKE ONE CAPSULE BY MOUTH EVERY NIGHT AT BEDTIME AS NEEDED FOR SLEEP 03/27/20   Ria Bush, MD  traMADol (ULTRAM) 50 MG tablet Take 1 tablet (50 mg total) by mouth every 6 (six) hours as needed for moderate pain. 05/14/20   Sable Feil, PA-C    Allergies Lyrica [pregabalin]  Family History  Problem Relation Age of Onset  . Stroke Father   . Diabetes Father   . Other Mother        blood disease, died 72 (?porphyria)  . Diabetes type II Brother   . Diabetes type II Maternal Aunt   . Hypertension Sister   . Stroke Sister 53  . Thyroid disease Sister   . Cancer Neg Hx   . CAD Neg Hx     Social History Social History   Tobacco Use  . Smoking status: Never Smoker  . Smokeless tobacco: Never Used  Vaping Use  . Vaping Use: Never used  Substance Use Topics  . Alcohol use: Yes    Alcohol/week: 6.0 standard drinks    Types: 6 Cans of beer per week    Comment:    . Drug use: No    Review of Systems  Constitutional: No fever/chills Eyes: No visual changes. ENT: No sore throat. Cardiovascular: Denies chest pain. Respiratory: Denies shortness of breath. Gastrointestinal: No abdominal pain.  No nausea, no vomiting.  No diarrhea.  No constipation. Genitourinary: Negative for dysuria. Musculoskeletal: Negative for back pain. Skin: Negative for rash. Dorsal hand laceration Neurological: Negative for headaches, focal weakness or numbness. Endocrine:  Diabetes,  hyperlipidemia, hypertension. Allergic/Immunilogical: Lyrica ____________________________________________   PHYSICAL EXAM:  VITAL SIGNS: ED Triage Vitals  Enc Vitals Group     BP 05/14/20 0416 (!) 142/82     Pulse Rate 05/14/20 0416 74     Resp 05/14/20 0416 18     Temp 05/14/20 0416 98.5 F (36.9 C)     Temp Source 05/14/20 0416 Oral     SpO2 05/14/20 0416 100 %     Weight 05/14/20 0417 168 lb (76.2 kg)     Height 05/14/20 0416 5' (1.524 m)     Head Circumference --      Peak Flow --      Pain Score 05/14/20 0415 0     Pain Loc --      Pain Edu? --      Excl. in [place]? --    Constitutional: Alert  and oriented. Well appearing and in no acute distress. Cardiovascular: Normal rate, regular rhythm. Grossly normal heart sounds.  Good peripheral circulation. Respiratory: Normal respiratory effort.  No retractions. Lungs CTAB. Skin: 1.5 cm laceration dorsal aspect of left hand. No rash noted. Psychiatric: Mood and affect are normal. Speech and behavior are normal.  ____________________________________________   LABS (all labs ordered are listed, but only abnormal results are displayed)  Labs Reviewed - No data to display ____________________________________________  EKG   ____________________________________________  RADIOLOGY  ED MD interpretation:    Official radiology report(s): No results found.  ____________________________________________   PROCEDURES  Procedure(s) performed (including Critical Care):  Marland KitchenMarland KitchenLaceration Repair  Date/Time: 05/14/2020 9:36 AM Performed by: Sable Feil, PA-C Authorized by: Sable Feil, PA-C   Consent:    Consent obtained:  Verbal   Consent given by:  Patient   Risks discussed:  Infection, pain, poor cosmetic result and need for additional repair Anesthesia (see MAR for exact dosages):    Anesthesia method:  Local infiltration   Local anesthetic:  Lidocaine 1% w/o epi Laceration details:    Location:  Hand   Hand  location:  L hand, dorsum   Length (cm):  1.5   Depth (mm):  1 Repair type:    Repair type:  Simple Pre-procedure details:    Preparation:  Patient was prepped and draped in usual sterile fashion Exploration:    Contaminated: no   Treatment:    Area cleansed with:  Betadine and saline   Amount of cleaning:  Standard Skin repair:    Repair method:  Sutures   Suture size:  3-0   Suture material:  Nylon   Suture technique:  Simple interrupted   Number of sutures:  5 Approximation:    Approximation:  Close Post-procedure details:    Dressing:  Antibiotic ointment and sterile dressing   Patient tolerance of procedure:  Tolerated well, no immediate complications     ____________________________________________   INITIAL IMPRESSION / ASSESSMENT AND PLAN / ED COURSE  As part of my medical decision making, I reviewed the following data within the [place]     Patient presented with laceration to assess for the left knee. See procedure note for wound closure. Patient given discharge care instructions a prescription for tramadol. Patient advised to have sutures removed in 10 days.    Samy Ryner. was evaluated in Emergency Department on 05/14/2020 for the symptoms described in the history of present illness. He was evaluated in the context of the global COVID-19 pandemic, which necessitated consideration that the patient might be at risk for infection with the SARS-CoV-2 virus that causes COVID-19. Institutional protocols and algorithms that pertain to the evaluation of patients at risk for COVID-19 are in a state of rapid change based on information released by regulatory bodies including the CDC and federal and state organizations. These policies and algorithms were followed during the patient's care in the ED.       ____________________________________________   FINAL CLINICAL IMPRESSION(S) / ED DIAGNOSES  Final diagnoses:  Laceration of left hand without  foreign body, initial encounter     ED Discharge Orders         Ordered    traMADol (ULTRAM) 50 MG tablet  Every 6 hours PRN        05/14/20 0933           Note:  This document was prepared using Dragon voice recognition software and may include unintentional dictation  errors.    Sable Feil, PA-C 05/14/20 0940    Vladimir Crofts, MD 05/14/20 [phone]

## 2020-05-14 NOTE — Discharge Instructions (Signed)
Follow discharge care instruction take medication as needed for pain. Have sutures removed in 10 days.

## 2020-05-14 NOTE — ED Triage Notes (Signed)
Patient was at work on a machine, reached in to take something out of the machine and hit the top of his hand on a piece of metal. Presents with an approximate 1.5 inch laceration that requires repair. His MyChart shows his TdaP is overdue. Bleeding is controlled at this time.

## 2020-05-14 NOTE — ED Provider Notes (Signed)
[facility] Emergency Department Provider Note   ____________________________________________   First MD Initiated Contact with Patient 05/14/20 [phone]     (approximate)  I have reviewed the triage vital signs and the nursing notes.   HISTORY  Chief Complaint Laceration (Workers Comp)    HPI Kevin Sheppard. is a 69 y.o. male patient presents a laceration dorsal aspect the left hand.  Patient states that his head on machine at work.  Bleeding is controlled with direct pressure.  Patient states tetanus status not up-to-date.  Patient denies loss sensation or loss of function.  Patient is right-hand dominant.         Past Medical History:  Diagnosis Date  . Abnormal drug screen 8/2-05/2015   +MJ pt states one time thing (04/2015)  . COVID-19 virus infection 09/2019  . Diabetes type 2, controlled ([place])   . History of chicken pox   . History of colon polyps    benign  . HLD (hyperlipidemia)   . Hypothyroid   . 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 Hydronephrosis with obstructing calculus 02/23/2020  . Advanced directives, counseling/discussion 10/12/2019  . Personal history of colonic polyps   . Polyp of sigmoid colon   . Benign neoplasm of descending colon   . Benign neoplasm of cecum   . Right shoulder pain 08/09/2018  . Left anterior knee pain 06/01/2017  . Overweight with body mass index (BMI) 25.0-29.9 06/01/2017  . Health maintenance examination 03/13/2015  . Undescended left testicle   . Benign skin lesion of nose 10/30/2014  . Erectile dysfunction 10/30/2014  . 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 Hyperlipidemia associated with type 2 diabetes mellitus ([place])   . Peripheral neuropathy 12/07/2013  . Hypothyroidism 10/26/2013  . Type 2 diabetes mellitus with other specified complication  ([place]) 12/87/8676    Past Surgical History:  Procedure Laterality Date  . COLONOSCOPY  2004;2009   first with b9 polyps, then diverticulosis no polyps, rpt 67yrs ([facility])  . COLONOSCOPY WITH PROPOFOL N/A 08/30/2018   2 TA, 1 SSP, diverticulosis, rpt 5 yrs Allen Norris, Darren, MD)  . MENISCUS REPAIR Bilateral 2002;2008  . POLYPECTOMY N/A 08/30/2018   Procedure: POLYPECTOMY;  Surgeon: Lucilla Lame, MD;  Location: [facility];  Service: Endoscopy;  Laterality: N/A;    Prior to Admission medications   Medication Sig Start Date End Date Taking? Authorizing Provider  atorvastatin (LIPITOR) 20 MG tablet Take 1 tablet (20 mg total) by mouth daily. 10/12/19   Ria Bush, MD  ciclopirox (LOPROX) 0.77 % cream Apply topically 2 (two) times daily. 10/24/18   Ria Bush, MD  diclofenac sodium (VOLTAREN) 1 % GEL Apply 1 application topically 3 (three) times daily. 08/09/18   Ria Bush, MD  glipiZIDE (GLUCOTROL XL) 5 MG 24 hr tablet Take 1 tablet (5 mg total) by mouth daily with breakfast. 02/23/20   Ria Bush, MD  glucose blood (ONE TOUCH ULTRA TEST) test strip Check three times daily and as needed E11.65 07/03/17   Ria Bush, MD  HYDROcodone-acetaminophen (NORCO) 5-325 MG tablet Take 1 tablet by mouth every 4 (four) hours as needed for moderate pain. 02/16/20   Merlyn Lot, MD  ibuprofen (IBU) 800 MG tablet Take 1 tablet (800 mg total) by mouth 2 (two) times daily as needed. 08/09/18   Ria Bush, MD  levothyroxine (SYNTHROID) 100 MCG tablet Take 1 tablet (100 mcg total) by mouth daily with breakfast. 10/12/19   Ria Bush, MD  metFORMIN (GLUCOPHAGE) 1000 MG tablet TAKE ONE TABLET BY MOUTH TWICE A DAY WITH A MEAL 09/13/19   Ria Bush, MD  Multiple Vitamins-Minerals (MULTIVITAMIN ADULTS 50+ PO) Take 1 tablet by mouth daily.    [provider]  Omega-3 Fatty Acids (FISH OIL) 1000 MG CAPS Take 1 capsule by mouth daily.    [provider]  ondansetron (ZOFRAN ODT) 4 MG disintegrating tablet Take 1 tablet (4 mg total) by mouth every 8 (eight) hours as needed for nausea or vomiting. 02/16/20   Merlyn Lot, MD  Semaglutide, 1 MG/DOSE, (OZEMPIC, 1 MG/DOSE,) 2 MG/1.5ML SOPN Inject 1 mg into the skin once a week. 10/12/19   Ria Bush, MD  sildenafil (REVATIO) 20 MG tablet Take 1-5 tablets (20-100 mg total) by mouth daily as needed (relations). 10/12/19   Ria Bush, MD  tadalafil (CIALIS) 10 MG tablet TAKE ONE TABLET BY MOUTH EVERY OTHER DAY AS NEEDED FOR ERECTILE DYSFUNCTION (THIS REPLACES SILDENAFIL) 04/16/20   Ria Bush, MD  tamsulosin (FLOMAX) 0.4 MG CAPS capsule Take 1 capsule (0.4 mg total) by mouth daily after supper. 02/16/20   Merlyn Lot, MD  temazepam (RESTORIL) 15 MG capsule TAKE ONE CAPSULE BY MOUTH EVERY NIGHT AT BEDTIME AS NEEDED FOR SLEEP 03/27/20   Ria Bush, MD  traMADol (ULTRAM) 50 MG tablet Take 1 tablet (50 mg total) by mouth every 6 (six) hours as needed for moderate pain. 05/14/20   Sable Feil, PA-C    Allergies Lyrica [pregabalin]  Family History  Problem Relation Age of Onset  . Stroke Father   . Diabetes Father   . Other Mother        blood disease, died 64 (?porphyria)  . Diabetes type II Brother   . Diabetes type II Maternal Aunt   . Hypertension Sister   . Stroke Sister 73  . Thyroid disease Sister   . Cancer Neg Hx   . CAD Neg Hx     Social History Social History   Tobacco Use  . Smoking status: Never Smoker  . Smokeless tobacco: Never Used  Vaping Use  . Vaping Use: Never used  Substance Use Topics  . Alcohol use: Yes    Alcohol/week: 6.0 standard drinks    Types: 6 Cans of beer per week    Comment:    . Drug use: No    Review of Systems Constitutional: No fever/chills Eyes: No visual changes. ENT: No sore throat. Cardiovascular: Denies chest pain. Respiratory: Denies shortness of breath. Gastrointestinal: No abdominal pain.   No nausea, no vomiting.  No diarrhea.  No constipation. Genitourinary: Negative for dysuria. Musculoskeletal: Negative for back pain. Skin: Negative for rash. Neurological: Negative for headaches, focal weakness or numbness. Endocrine:  Diabetes, hyperlipidemia, and hypothyroidism. Allergic/Immunilogical: Lyrica ____________________________________________   PHYSICAL EXAM:  VITAL SIGNS: ED Triage Vitals  Enc Vitals Group     BP 05/14/20 0416 (!) 142/82     Pulse Rate 05/14/20 0416 74     Resp 05/14/20 0416 18     Temp 05/14/20 0416 98.5 F (36.9 C)     Temp Source 05/14/20 0416 Oral     SpO2 05/14/20 0416 100 %     Weight 05/14/20 0417 168 lb (76.2 kg)     Height 05/14/20 0416 5' (1.524 m)     Head Circumference --  Peak Flow --      Pain Score 05/14/20 0415 0     Pain Loc --      Pain Edu? --      Excl. in [place]? --     Constitutional: Alert and oriented. Well appearing and in no acute distress. Cardiovascular: Normal rate, regular rhythm. Grossly normal heart sounds.  Good peripheral circulation. Respiratory: Normal respiratory effort.  No retractions. Lungs CTAB. Skin: 1.5 cm laceration dorsal aspect left hand.. No rash noted. Psychiatric: Mood and affect are normal. Speech and behavior are normal.  ____________________________________________   LABS (all labs ordered are listed, but only abnormal results are displayed)  Labs Reviewed - No data to display ____________________________________________  EKG   ____________________________________________  RADIOLOGY  ED MD interpretation:    Official radiology report(s): No results found.  ____________________________________________   PROCEDURES  Procedure(s) performed (including Critical Care):  Marland KitchenMarland KitchenLaceration Repair  Date/Time: 05/14/2020 1:22 PM Performed by: Sable Feil, PA-C Authorized by: Sable Feil, PA-C   Consent:    Consent obtained:  Verbal   Consent given by:  Patient   Risks  discussed:  Infection, pain, poor cosmetic result and need for additional repair Anesthesia (see MAR for exact dosages):    Anesthesia method:  Local infiltration   Local anesthetic:  Lidocaine 1% w/o epi Laceration details:    Location:  Hand   Hand location:  L hand, dorsum   Length (cm):  1.5   Depth (mm):  1 Repair type:    Repair type:  Simple Pre-procedure details:    Preparation:  Patient was prepped and draped in usual sterile fashion Exploration:    Contaminated: no   Treatment:    Area cleansed with:  Betadine and saline   Amount of cleaning:  Standard Skin repair:    Repair method:  Sutures   Suture size:  3-0   Suture technique:  Simple interrupted   Number of sutures:  5 Approximation:    Approximation:  Close Post-procedure details:    Dressing:  Antibiotic ointment and sterile dressing   Patient tolerance of procedure:  Tolerated well, no immediate complications     ____________________________________________   INITIAL IMPRESSION / ASSESSMENT AND PLAN / ED COURSE  As part of my medical decision making, I reviewed the following data within the [place]     Patient presents with laceration to dorsal aspect left hand.  See procedure note for wound closure.  Patient given discharge care instructions and advised return back in 10 days for suture removal.    Judy Goodenow. was evaluated in Emergency Department on 05/14/2020 for the symptoms described in the history of present illness. He was evaluated in the context of the global COVID-19 pandemic, which necessitated consideration that the patient might be at risk for infection with the SARS-CoV-2 virus that causes COVID-19. Institutional protocols and algorithms that pertain to the evaluation of patients at risk for COVID-19 are in a state of rapid change based on information released by regulatory bodies including the CDC and federal and state organizations. These policies and algorithms were  followed during the patient's care in the ED.       ____________________________________________   FINAL CLINICAL IMPRESSION(S) / ED DIAGNOSES  Final diagnoses:  Laceration of left hand without foreign body, initial encounter     ED Discharge Orders         Ordered    traMADol (ULTRAM) 50 MG tablet  Every 6 hours PRN  05/14/20 0933           Note:  This document was prepared using Dragon voice recognition software and may include unintentional dictation errors.    Sable Feil, PA-C 05/14/20 1326    Vladimir Crofts, MD 05/14/20 [phone]

## 2020-06-01 ENCOUNTER — Encounter: Payer: Self-pay | Admitting: Family Medicine

## 2020-06-01 ENCOUNTER — Other Ambulatory Visit: Payer: Self-pay

## 2020-06-01 ENCOUNTER — Ambulatory Visit (INDEPENDENT_AMBULATORY_CARE_PROVIDER_SITE_OTHER): Payer: 59 | Admitting: Family Medicine

## 2020-06-01 VITALS — BP 120/70 | HR 78 | Temp 98.4°F | Ht 65.0 in | Wt 173.2 lb

## 2020-06-01 DIAGNOSIS — E1169 Type 2 diabetes mellitus with other specified complication: Secondary | ICD-10-CM

## 2020-06-01 LAB — POCT GLYCOSYLATED HEMOGLOBIN (HGB A1C): Hemoglobin A1C: 7 % — AB (ref 4.0–5.6)

## 2020-06-01 MED ORDER — IBUPROFEN 800 MG PO TABS: 800.0000 mg | ORAL_TABLET | Freq: Two times a day (BID) | ORAL | 1 refills | Status: DC | PRN

## 2020-06-01 MED ORDER — IBUPROFEN 800 MG PO TABS: 800.0000 mg | ORAL_TABLET | Freq: Two times a day (BID) | ORAL | 3 refills | Status: DC | PRN

## 2020-06-01 NOTE — Patient Instructions (Addendum)
We will request records of latest eye exam from [facility] in [place].  Sugars are doing better! Keep up the good work Good to see you today.  Return for physical end of 09/2020.

## 2020-06-01 NOTE — Progress Notes (Signed)
This visit was conducted in person.  BP 120/70 (BP Location: Left Arm, Patient Position: Sitting, Cuff Size: Normal)   Pulse 78   Temp 98.4 F (36.9 C) (Temporal)   Ht 5\' 5"  (1.651 m)   Wt 173 lb 3 oz (78.6 kg)   SpO2 97%   BMI 28.82 kg/m    CC: DM f/u visit  Subjective:    Patient ID: Kevin Sheppard., male    DOB: March 06, 1951, 69 y.o.   MRN: [id number]  HPI: Kevin Sheppard. is a 69 y.o. male presenting on 06/01/2020 for Diabetes (Here for 3 mo f/u.)   3rd shift - he works out swimming prior to work.  Quit [employer]!  Requests ibuprofen PRN - discussed use sparingly, caution with kidneys and stomach.  DM - does regularly check sugars fasting 120-140, postprandial 150-170. Compliant with antihyperglycemic regimen which includes: glipizide XL 5mg  daily, metformin 1000mg  bid, ozempic 1mg  weekly. Has started limiting carbs. Denies low sugars or hypoglycemic symptoms. Denies paresthesias. Last diabetic eye exam DUE. Pneumovax: 2019, 2020. Prevnar: 2015. Glucometer brand: one-touch. DSME: completed 2011. Lab Results  Component Value Date   HGBA1C 7.0 (A) 06/01/2020   Diabetic Foot Exam - Simple   Simple Foot Form Diabetic Foot exam was performed with the following findings: Yes 06/01/2020  4:21 PM  Visual Inspection No deformities, no ulcerations, no other skin breakdown bilaterally: Yes Sensation Testing Intact to touch and monofilament testing bilaterally: Yes Pulse Check Posterior Tibialis and Dorsalis pulse intact bilaterally: Yes Comments    Lab Results  Component Value Date   MICROALBUR 2.8 (H) 10/05/2019         Relevant past medical, surgical, family and social history reviewed and updated as indicated. Interim medical history since our last visit reviewed. Allergies and medications reviewed and updated. Outpatient Medications Prior to Visit  Medication Sig Dispense Refill  . atorvastatin (LIPITOR) 20 MG tablet Take 1 tablet (20 mg total) by mouth daily. 30 tablet 11    . ciclopirox (LOPROX) 0.77 % cream Apply topically 2 (two) times daily. 30 g 0  . diclofenac sodium (VOLTAREN) 1 % GEL Apply 1 application topically 3 (three) times daily. 1 Tube 1  . glipiZIDE (GLUCOTROL XL) 5 MG 24 hr tablet Take 1 tablet (5 mg total) by mouth daily with breakfast. 30 tablet 6  . glucose blood (ONE TOUCH ULTRA TEST) test strip Check three times daily and as needed E11.65 100 each 6  . levothyroxine (SYNTHROID) 100 MCG tablet Take 1 tablet (100 mcg total) by mouth daily with breakfast. 30 tablet 11  . metFORMIN (GLUCOPHAGE) 1000 MG tablet TAKE ONE TABLET BY MOUTH TWICE A DAY WITH A MEAL 180 tablet 2  . Multiple Vitamins-Minerals (MULTIVITAMIN ADULTS 50+ PO) Take 1 tablet by mouth daily.    . Omega-3 Fatty Acids (FISH OIL) 1000 MG CAPS Take 1 capsule by mouth daily.    . ondansetron (ZOFRAN ODT) 4 MG disintegrating tablet Take 1 tablet (4 mg total) by mouth every 8 (eight) hours as needed for nausea or vomiting. 20 tablet 0  . Semaglutide, 1 MG/DOSE, (OZEMPIC, 1 MG/DOSE,) 2 MG/1.5ML SOPN Inject 1 mg into the skin once a week. 2 pen 11  . tadalafil (CIALIS) 10 MG tablet TAKE ONE TABLET BY MOUTH EVERY OTHER DAY AS NEEDED FOR ERECTILE DYSFUNCTION (THIS REPLACES SILDENAFIL) 10 tablet 1  . temazepam (RESTORIL) 15 MG capsule TAKE ONE CAPSULE BY MOUTH EVERY NIGHT AT BEDTIME AS NEEDED FOR SLEEP 30 capsule 1  .  traMADol (ULTRAM) 50 MG tablet Take 1 tablet (50 mg total) by mouth every 6 (six) hours as needed for moderate pain. 12 tablet 0  . ibuprofen (IBU) 800 MG tablet Take 1 tablet (800 mg total) by mouth 2 (two) times daily as needed. 50 tablet 3  . tamsulosin (FLOMAX) 0.4 MG CAPS capsule Take 1 capsule (0.4 mg total) by mouth daily after supper. (Patient not taking: Reported on 06/01/2020) 10 capsule 0  . HYDROcodone-acetaminophen (NORCO) 5-325 MG tablet Take 1 tablet by mouth every 4 (four) hours as needed for moderate pain. 6 tablet 0  . sildenafil (REVATIO) 20 MG tablet Take 1-5  tablets (20-100 mg total) by mouth daily as needed (relations). 30 tablet 3   No facility-administered medications prior to visit.     Per HPI unless specifically indicated in ROS section below Review of Systems Objective:  BP 120/70 (BP Location: Left Arm, Patient Position: Sitting, Cuff Size: Normal)   Pulse 78   Temp 98.4 F (36.9 C) (Temporal)   Ht 5\' 5"  (1.651 m)   Wt 173 lb 3 oz (78.6 kg)   SpO2 97%   BMI 28.82 kg/m   Wt Readings from Last 3 Encounters:  06/01/20 173 lb 3 oz (78.6 kg)  05/14/20 168 lb (76.2 kg)  02/23/20 171 lb (77.6 kg)      Physical Exam Vitals and nursing note reviewed.  Constitutional:      General: He is not in acute distress.    Appearance: Normal appearance. He is well-developed. He is not ill-appearing.  Eyes:     General: No scleral icterus.    Extraocular Movements: Extraocular movements intact.     Conjunctiva/sclera: Conjunctivae normal.     Pupils: Pupils are equal, round, and reactive to light.  Cardiovascular:     Rate and Rhythm: Normal rate and regular rhythm.     Pulses: Normal pulses.     Heart sounds: Normal heart sounds. No murmur heard.   Pulmonary:     Effort: Pulmonary effort is normal. No respiratory distress.     Breath sounds: Normal breath sounds. No wheezing, rhonchi or rales.  Musculoskeletal:     Cervical back: Normal range of motion and neck supple.     Right lower leg: No edema.     Left lower leg: No edema.     Comments: See HPI for foot exam if done  Lymphadenopathy:     Cervical: No cervical adenopathy.  Skin:    General: Skin is warm and dry.     Findings: No rash.  Neurological:     Mental Status: He is alert.  Psychiatric:        Mood and Affect: Mood normal.        Behavior: Behavior normal.       Results for orders placed or performed in visit on 06/01/20  POCT glycosylated hemoglobin (Hb A1C)  Result Value Ref Range   Hemoglobin A1C 7.0 (A) 4.0 - 5.6 %   HbA1c POC (<> result, manual entry)      HbA1c, POC (prediabetic range)     HbA1c, POC (controlled diabetic range)     Assessment & Plan:  This visit occurred during the SARS-CoV-2 public health emergency.  Safety protocols were in place, including screening questions prior to the visit, additional usage of staff PPE, and extensive cleaning of exam room while observing appropriate contact time as indicated for disinfecting solutions.   Problem List Items Addressed This Visit    Type  2 diabetes mellitus with other specified complication ([place]) - Primary    Chronic, stable. Congratulated on improved control to date. Attributes to healthy diet and lifestyle changes. Motivated to continue this. Reassess at CPE. We will request records from latest eye exam.       Relevant Orders   POCT glycosylated hemoglobin (Hb A1C) (Completed)       Meds ordered this encounter  Medications  . DISCONTD: ibuprofen (IBU) 800 MG tablet    Sig: Take 1 tablet (800 mg total) by mouth 2 (two) times daily as needed for moderate pain.    Dispense:  50 tablet    Refill:  3  . ibuprofen (IBU) 800 MG tablet    Sig: Take 1 tablet (800 mg total) by mouth 2 (two) times daily as needed for moderate pain.    Dispense:  50 tablet    Refill:  1    Use this # refill   Orders Placed This Encounter  Procedures  . POCT glycosylated hemoglobin (Hb A1C)    Patient Instructions  We will request records of latest eye exam from [facility] in [place].  Sugars are doing better! Keep up the good work Good to see you today.  Return for physical end of 09/2020.    Follow up plan: Return if symptoms worsen or fail to improve.  Ria Bush, MD

## 2020-06-01 NOTE — Assessment & Plan Note (Signed)
Chronic, stable. Congratulated on improved control to date. Attributes to healthy diet and lifestyle changes. Motivated to continue this. Reassess at CPE. We will request records from latest eye exam.

## 2020-06-05 ENCOUNTER — Encounter: Payer: Self-pay | Admitting: Family Medicine

## 2020-06-08 ENCOUNTER — Encounter: Payer: Self-pay | Admitting: Family Medicine

## 2020-06-08 MED ORDER — ACCU-CHEK GUIDE VI STRP: ORAL_STRIP | 3 refills | Status: DC

## 2020-06-08 NOTE — Telephone Encounter (Signed)
E-scribed Accu-Chek Guide test strips.  Notified pt.

## 2020-06-11 ENCOUNTER — Telehealth: Payer: Self-pay | Admitting: *Deleted

## 2020-06-11 ENCOUNTER — Other Ambulatory Visit: Payer: Self-pay | Admitting: Family Medicine

## 2020-06-11 MED ORDER — ACCU-CHEK GUIDE VI STRP: ORAL_STRIP | 3 refills | Status: DC

## 2020-06-11 NOTE — Telephone Encounter (Signed)
New sig sent

## 2020-06-11 NOTE — Telephone Encounter (Signed)
Manuela Schwartz at [facility] left a voicemail stating that they received a script for accu-chek test strips that shows use as directed. Manuela Schwartz stated that they need specific directions as to how often the patient should be testing his sugar. Manuela Schwartz stated that this is needed so that the insurance will cover the test stirps. Manuela Schwartz stated that you can call with directions or send in another script with the specific directions.

## 2020-06-13 ENCOUNTER — Other Ambulatory Visit: Payer: Self-pay | Admitting: Family Medicine

## 2020-06-13 NOTE — Telephone Encounter (Signed)
Name of Medication: Temazepam Name of [facility]acy: [facility] Written Date and Quantity: 05/06/20, #30 Last Office Visit and Type: 06/01/20, 3 mo f/u Next Office Visit and Type: 10/12/20, CPE Last Controlled Substance Agreement Date: none Last UDS: none

## 2020-06-15 NOTE — Telephone Encounter (Signed)
ERx 

## 2020-07-06 ENCOUNTER — Other Ambulatory Visit: Payer: Self-pay | Admitting: Family Medicine

## 2020-08-02 ENCOUNTER — Encounter: Payer: Self-pay | Admitting: Family Medicine

## 2020-08-03 NOTE — Telephone Encounter (Addendum)
Attempted to speak with pt to get more info on abd pain.  Could not hear him due to call going in and out.  Pt needs OV today, if no vomiting and/or diarrhea.    I will also send MyChart message asking pt to call to schedule OV.

## 2020-09-18 ENCOUNTER — Other Ambulatory Visit: Payer: Self-pay | Admitting: Family Medicine

## 2020-10-03 ENCOUNTER — Other Ambulatory Visit: Payer: Self-pay | Admitting: Family Medicine

## 2020-10-04 ENCOUNTER — Other Ambulatory Visit: Payer: Self-pay | Admitting: Family Medicine

## 2020-10-04 DIAGNOSIS — Z125 Encounter for screening for malignant neoplasm of prostate: Secondary | ICD-10-CM

## 2020-10-04 DIAGNOSIS — E785 Hyperlipidemia, unspecified: Secondary | ICD-10-CM

## 2020-10-04 DIAGNOSIS — E1169 Type 2 diabetes mellitus with other specified complication: Secondary | ICD-10-CM

## 2020-10-04 DIAGNOSIS — E039 Hypothyroidism, unspecified: Secondary | ICD-10-CM

## 2020-10-05 ENCOUNTER — Other Ambulatory Visit: Payer: 59

## 2020-10-10 ENCOUNTER — Telehealth: Payer: Self-pay

## 2020-10-10 NOTE — Telephone Encounter (Signed)
Received faxed PA form from [facility] for tadalafil 10 mg tab.  Placed form in Dr. Synthia Innocent box.

## 2020-10-10 NOTE — Telephone Encounter (Addendum)
Filled and in Lisa's box 

## 2020-10-11 NOTE — Telephone Encounter (Signed)
Faxed form.  Decision pending.  

## 2020-10-12 ENCOUNTER — Encounter: Payer: 59 | Admitting: Family Medicine

## 2020-10-12 ENCOUNTER — Other Ambulatory Visit: Payer: Self-pay | Admitting: Family Medicine

## 2020-10-16 NOTE — Telephone Encounter (Signed)
Noted. plz notify pt. He will have to pay out of pocket if he wants to use tadalafil.

## 2020-10-16 NOTE — Telephone Encounter (Signed)
Received PA denial.  Reason:  Pt's prescription plan does not cover the requested med.  FYI to Dr. Darnell Level.

## 2020-10-17 NOTE — Telephone Encounter (Signed)
Lvm asking pt to call back.  Need to relay Dr. G's message.  

## 2020-10-18 NOTE — Telephone Encounter (Signed)
Lvm asking pt to call back.  Need to relay Dr. G's message.  

## 2020-10-19 ENCOUNTER — Other Ambulatory Visit: Payer: Self-pay | Admitting: Family Medicine

## 2020-10-19 NOTE — Telephone Encounter (Signed)
Lvm asking pt to call back.  Need to relay Dr. G's message.   Mailing a letter.  

## 2020-10-25 ENCOUNTER — Other Ambulatory Visit: Payer: Self-pay | Admitting: Family Medicine

## 2020-10-25 NOTE — Telephone Encounter (Signed)
ERx 

## 2020-10-26 ENCOUNTER — Telehealth: Payer: Self-pay

## 2020-10-26 NOTE — Telephone Encounter (Signed)
Received faxed PA request for temazepam 15 mg cap from [facility].    Submitted PA; key:  [id number].  Decision pending.

## 2020-10-30 MED ORDER — TEMAZEPAM 15 MG PO CAPS: 15.0000 mg | ORAL_CAPSULE | Freq: Every evening | ORAL | 0 refills | Status: DC | PRN

## 2020-10-30 NOTE — Telephone Encounter (Signed)
plz notify pt - insurance only covers #15 temazepam per month. New Rx sent in.

## 2020-10-30 NOTE — Addendum Note (Signed)
Addended by: Ria Bush on: 10/30/2020 05:05 PM   Modules accepted: Orders

## 2020-10-30 NOTE — Telephone Encounter (Addendum)
Received faxed PA denial.  Reason:  More than the maximum quantity allowed by pt's plan was requested.  Pt's plan covers up to 15 caps of temazepam per month.

## 2020-10-31 NOTE — Telephone Encounter (Signed)
Spoke with pt relaying Dr. G's message. Pt verbalizes understanding.  

## 2020-11-13 ENCOUNTER — Ambulatory Visit (INDEPENDENT_AMBULATORY_CARE_PROVIDER_SITE_OTHER): Payer: 59 | Admitting: Family Medicine

## 2020-11-13 ENCOUNTER — Encounter: Payer: Self-pay | Admitting: Family Medicine

## 2020-11-13 ENCOUNTER — Other Ambulatory Visit: Payer: Self-pay

## 2020-11-13 VITALS — BP 124/78 | HR 74 | Temp 97.9°F | Ht 64.5 in | Wt 175.6 lb

## 2020-11-13 DIAGNOSIS — I444 Left anterior fascicular block: Secondary | ICD-10-CM

## 2020-11-13 DIAGNOSIS — N529 Male erectile dysfunction, unspecified: Secondary | ICD-10-CM

## 2020-11-13 DIAGNOSIS — G4726 Circadian rhythm sleep disorder, shift work type: Secondary | ICD-10-CM

## 2020-11-13 DIAGNOSIS — Z125 Encounter for screening for malignant neoplasm of prostate: Secondary | ICD-10-CM

## 2020-11-13 DIAGNOSIS — E039 Hypothyroidism, unspecified: Secondary | ICD-10-CM

## 2020-11-13 DIAGNOSIS — E785 Hyperlipidemia, unspecified: Secondary | ICD-10-CM

## 2020-11-13 DIAGNOSIS — Z0000 Encounter for general adult medical examination without abnormal findings: Secondary | ICD-10-CM | POA: Diagnosis not present

## 2020-11-13 DIAGNOSIS — E1169 Type 2 diabetes mellitus with other specified complication: Secondary | ICD-10-CM

## 2020-11-13 MED ORDER — OZEMPIC (1 MG/DOSE) 4 MG/3ML ~~LOC~~ SOPN: 1.0000 mg | PEN_INJECTOR | SUBCUTANEOUS | 11 refills | Status: DC

## 2020-11-13 MED ORDER — TADALAFIL 10 MG PO TABS: 10.0000 mg | ORAL_TABLET | Freq: Every day | ORAL | 3 refills | Status: DC | PRN

## 2020-11-13 MED ORDER — ATORVASTATIN CALCIUM 20 MG PO TABS: 20.0000 mg | ORAL_TABLET | Freq: Every day | ORAL | 3 refills | Status: DC

## 2020-11-13 MED ORDER — GLIPIZIDE ER 5 MG PO TB24: 5.0000 mg | ORAL_TABLET | Freq: Every day | ORAL | 3 refills | Status: DC

## 2020-11-13 MED ORDER — LEVOTHYROXINE SODIUM 100 MCG PO TABS: 100.0000 ug | ORAL_TABLET | Freq: Every day | ORAL | 3 refills | Status: DC

## 2020-11-13 MED ORDER — METFORMIN HCL 1000 MG PO TABS: 1000.0000 mg | ORAL_TABLET | Freq: Two times a day (BID) | ORAL | 3 refills | Status: DC

## 2020-11-13 NOTE — Patient Instructions (Addendum)
We will update EKG today  We will request latest eye exam 10/2020 from [facility] on [address] in [place]  Return as needed or in 6 months for diabetes follow up visit.  Good to see you today.   Health Maintenance After Age 69 After age 14, you are at a higher risk for certain long-term diseases and infections as well as injuries from falls. Falls are a major cause of broken bones and head injuries in people who are older than age 53. Getting regular preventive care can help to keep you healthy and well. Preventive care includes getting regular testing and making lifestyle changes as recommended by your health care provider. Talk with your health care provider about:  Which screenings and tests you should have. A screening is a test that checks for a disease when you have no symptoms.  A diet and exercise plan that is right for you. What should I know about screenings and tests to prevent falls? Screening and testing are the best ways to find a health problem early. Early diagnosis and treatment give you the best chance of managing medical conditions that are common after age 64. Certain conditions and lifestyle choices may make you more likely to have a fall. Your health care provider may recommend:  Regular vision checks. Poor vision and conditions such as cataracts can make you more likely to have a fall. If you wear glasses, make sure to get your prescription updated if your vision changes.  Medicine review. Work with your health care provider to regularly review all of the medicines you are taking, including over-the-counter medicines. Ask your health care provider about any side effects that may make you more likely to have a fall. Tell your health care provider if any medicines that you take make you feel dizzy or sleepy.  Osteoporosis screening. Osteoporosis is a condition that causes the bones to get weaker. This can make the bones weak and cause them to break more easily.  Blood  pressure screening. Blood pressure changes and medicines to control blood pressure can make you feel dizzy.  Strength and balance checks. Your health care provider may recommend certain tests to check your strength and balance while standing, walking, or changing positions.  Foot health exam. Foot pain and numbness, as well as not wearing proper footwear, can make you more likely to have a fall.  Depression screening. You may be more likely to have a fall if you have a fear of falling, feel emotionally low, or feel unable to do activities that you used to do.  Alcohol use screening. Using too much alcohol can affect your balance and may make you more likely to have a fall. What actions can I take to lower my risk of falls? General instructions  Talk with your health care provider about your risks for falling. Tell your health care provider if: ? You fall. Be sure to tell your health care provider about all falls, even ones that seem minor. ? You feel dizzy, sleepy, or off-balance.  Take over-the-counter and prescription medicines only as told by your health care provider. These include any supplements.  Eat a healthy diet and maintain a healthy weight. A healthy diet includes low-fat dairy products, low-fat (lean) meats, and fiber from whole grains, beans, and lots of fruits and vegetables. Home safety  Remove any tripping hazards, such as rugs, cords, and clutter.  Install safety equipment such as grab bars in bathrooms and safety rails on stairs.  Keep  rooms and walkways well-lit. Activity  Follow a regular exercise program to stay fit. This will help you maintain your balance. Ask your health care provider what types of exercise are appropriate for you.  If you need a cane or walker, use it as recommended by your health care provider.  Wear supportive shoes that have nonskid soles.   Lifestyle  Do not drink alcohol if your health care provider tells you not to drink.  If you  drink alcohol, limit how much you have: ? 0-1 drink a day for women. ? 0-2 drinks a day for men.  Be aware of how much alcohol is in your drink. In the [place] one drink equals one typical bottle of beer (12 oz), one-half glass of wine (5 oz), or one shot of hard liquor (1 oz).  Do not use any products that contain nicotine or tobacco, such as cigarettes and e-cigarettes. If you need help quitting, ask your health care provider. Summary  Having a healthy lifestyle and getting preventive care can help to protect your health and wellness after age 74.  Screening and testing are the best way to find a health problem early and help you avoid having a fall. Early diagnosis and treatment give you the best chance for managing medical conditions that are more common for people who are older than age 4.  Falls are a major cause of broken bones and head injuries in people who are older than age 67. Take precautions to prevent a fall at home.  Work with your health care provider to learn what changes you can make to improve your health and wellness and to prevent falls. This information is not intended to replace advice given to you by your health care provider. Make sure you discuss any questions you have with your health care provider. Document Revised: 12/23/2018 Document Reviewed: 07/15/2017 Elsevier Patient Education  2021 [employer].

## 2020-11-13 NOTE — Assessment & Plan Note (Signed)
Noted today on EKG, present since 2006 EKG. Pt states he had normal stress test and catheterization at that time.

## 2020-11-13 NOTE — Assessment & Plan Note (Signed)
Cialis works better than viagra - refilled today.

## 2020-11-13 NOTE — Assessment & Plan Note (Addendum)
Chronic, stable. Continue current regimen - ozempic, metformin, glipizide. Pending A1c.

## 2020-11-13 NOTE — Assessment & Plan Note (Signed)
Continue atorvastatin pending FLP. The ASCVD Risk score Mikey Bussing DC Jr., et al., 2013) failed to calculate for the following reasons:   The valid total cholesterol range is 130 to 320 mg/dL   Unable to determine if patient is Non-Hispanic African American

## 2020-11-13 NOTE — Assessment & Plan Note (Signed)
Stable period. Pending TSH continue levothyroxine.

## 2020-11-13 NOTE — Progress Notes (Addendum)
Patient ID: Kevin Stowers., male    DOB: 29-Feb-1951, 70 y.o.   MRN: [id number]  This visit was conducted in person.  BP 124/78   Pulse 74   Temp 97.9 F (36.6 C) (Temporal)   Ht 5' 4.5" (1.638 m)   Wt 175 lb 9 oz (79.6 kg)   SpO2 97%   BMI 29.67 kg/m    CC: CPE Subjective:   HPI: Kevin Golson. is a 70 y.o. male presenting on 11/13/2020 for Annual Exam (Reminded pt his ins co does not cover tadalafil.  )   Only has medicare part A  DM - checks sugars twice daily fasting in am and when he gets off work. Fluctuating control (130-190 fasting). Works 3rd shift 11pm-7am.  Lab Results  Component Value Date   HGBA1C 7.0 (A) 06/01/2020   Brother had 4v bypass 08/2020 - age 38yo. Recovered well.   Preventative: COLONOSCOPY WITH PROPOFOL 08/30/2018 - 2 TA, 1 SSP, diverticulosis, rpt 5 yrs Allen Norris, Darren, MD)  Prostate cancer screening - yearly screen  Lung cancer screening - not eligible  Flu shotyearly  COVID vaccine J&J 12/2019, booster 06/2020 Tdap 2011, 04/2020 Prevnar 05/2014, pneumovax 2019, 2020 (extra dose repeated at pharmacy)  shingrix - 04/2018, 06/2018 Advanced directive - Discussed - has at home but needs updating. Wants wife to be HCPOA. Asked to bring me form.  Seat belt use discussed Sunscreen use discussed. No changing moles. Non smoker - wife smokes indoors. Works on [employer].  Alcohol - 1-2 drinks, only some on weekends  Rec drugs -  Dentist q6 mo  Eye exam yearly  He lives with wife. 2 dogs. They have 4 grown children and 3 grandchildren  He works on cigarette machines, 3rd shift  Activity: goes to gym and swims regularly about 5d/wk  Diet: good water, fruits/vegetables daily      Relevant past medical, surgical, family and social history reviewed and updated as indicated. Interim medical history since our last visit reviewed. Allergies and medications reviewed and updated. Outpatient Medications Prior to Visit  Medication Sig Dispense  Refill  . ciclopirox (LOPROX) 0.77 % cream Apply topically 2 (two) times daily. 30 g 0  . diclofenac sodium (VOLTAREN) 1 % GEL Apply 1 application topically 3 (three) times daily. 1 Tube 1  . glucose blood (ACCU-CHEK GUIDE) test strip Use as instructed to check sugars once daily with second check if needed E11.69 100 each 3  . ibuprofen (IBU) 800 MG tablet Take 1 tablet (800 mg total) by mouth 2 (two) times daily as needed for moderate pain. 50 tablet 1  . Multiple Vitamins-Minerals (MULTIVITAMIN ADULTS 50+ PO) Take 1 tablet by mouth daily.    . Omega-3 Fatty Acids (FISH OIL) 1000 MG CAPS Take 1 capsule by mouth daily.    . temazepam (RESTORIL) 15 MG capsule Take 1 capsule (15 mg total) by mouth at bedtime as needed for sleep. 15 capsule 0  . atorvastatin (LIPITOR) 20 MG tablet TAKE ONE TABLET BY MOUTH DAILY 30 tablet 2  . glipiZIDE (GLUCOTROL XL) 5 MG 24 hr tablet TAKE ONE TABLET BY MOUTH DAILY WITH BREAKFAST 30 tablet 6  . levothyroxine (SYNTHROID) 100 MCG tablet TAKE ONE TABLET BY MOUTH DAILY WITH BREAKFAST 30 tablet 2  . metFORMIN (GLUCOPHAGE) 1000 MG tablet TAKE ONE TABLET BY MOUTH TWICE A DAY WITH A MEAL 180 tablet 1  . ondansetron (ZOFRAN ODT) 4 MG disintegrating tablet Take 1 tablet (4 mg total) by mouth  every 8 (eight) hours as needed for nausea or vomiting. 20 tablet 0  . OZEMPIC, 1 MG/DOSE, 4 MG/3ML SOPN DIAL AND INJECT UNDER THE SKIN 1 MG WEEKLY 3 mL 0  . Semaglutide, 1 MG/DOSE, (OZEMPIC, 1 MG/DOSE,) 2 MG/1.5ML SOPN Inject 1 mg into the skin once a week. 2 pen 11  . tadalafil (CIALIS) 10 MG tablet TAKE ONE TABLET BY MOUTH EVERY OTHER DAY AS NEEDED FOR ERECTILE DYSFUNCTION - TO REPLACE SILDENAFIL 10 tablet 1  . tamsulosin (FLOMAX) 0.4 MG CAPS capsule Take 1 capsule (0.4 mg total) by mouth daily after supper. 10 capsule 0  . traMADol (ULTRAM) 50 MG tablet Take 1 tablet (50 mg total) by mouth every 6 (six) hours as needed for moderate pain. 12 tablet 0   No facility-administered  medications prior to visit.     Per HPI unless specifically indicated in ROS section below Review of Systems  Constitutional: Negative for activity change, appetite change, chills, fatigue, fever and unexpected weight change.  HENT: Negative for hearing loss.   Eyes: Negative for visual disturbance.  Respiratory: Negative for cough, chest tightness, shortness of breath and wheezing.   Cardiovascular: Negative for chest pain, palpitations and leg swelling.  Gastrointestinal: Negative for abdominal distention, abdominal pain, blood in stool, constipation, diarrhea, nausea and vomiting.  Genitourinary: Negative for difficulty urinating and hematuria.  Musculoskeletal: Negative for arthralgias, myalgias and neck pain.  Skin: Negative for rash.  Neurological: Negative for dizziness, seizures, syncope and headaches.  Hematological: Negative for adenopathy. Does not bruise/bleed easily.  Psychiatric/Behavioral: Negative for dysphoric mood. The patient is not nervous/anxious.    Objective:  BP 124/78   Pulse 74   Temp 97.9 F (36.6 C) (Temporal)   Ht 5' 4.5" (1.638 m)   Wt 175 lb 9 oz (79.6 kg)   SpO2 97%   BMI 29.67 kg/m   Wt Readings from Last 3 Encounters:  11/13/20 175 lb 9 oz (79.6 kg)  06/01/20 173 lb 3 oz (78.6 kg)  05/14/20 168 lb (76.2 kg)      Physical Exam Vitals and nursing note reviewed.  Constitutional:      General: He is not in acute distress.    Appearance: Normal appearance. He is well-developed and well-nourished. He is not ill-appearing.  HENT:     Head: Normocephalic and atraumatic.     Right Ear: Hearing, tympanic membrane, ear canal and external ear normal.     Left Ear: Hearing, tympanic membrane, ear canal and external ear normal.     Mouth/Throat:     Mouth: Oropharynx is clear and moist and mucous membranes are normal.     Pharynx: No posterior oropharyngeal edema.  Eyes:     General: No scleral icterus.    Extraocular Movements: Extraocular  movements intact and EOM normal.     Conjunctiva/sclera: Conjunctivae normal.     Pupils: Pupils are equal, round, and reactive to light.  Neck:     Thyroid: No thyroid mass or thyromegaly.     Vascular: No carotid bruit.  Cardiovascular:     Rate and Rhythm: Normal rate and regular rhythm.     Pulses: Normal pulses and intact distal pulses.          Radial pulses are 2+ on the right side and 2+ on the left side.     Heart sounds: Normal heart sounds. No murmur heard.   Pulmonary:     Effort: Pulmonary effort is normal. No respiratory distress.  Breath sounds: Normal breath sounds. No wheezing, rhonchi or rales.  Abdominal:     General: Abdomen is flat. Bowel sounds are normal. There is no distension.     Palpations: Abdomen is soft. There is no mass.     Tenderness: There is no abdominal tenderness. There is no guarding or rebound.     Hernia: No hernia is present.  Musculoskeletal:        General: No edema. Normal range of motion.     Cervical back: Normal range of motion and neck supple.     Right lower leg: No edema.     Left lower leg: No edema.  Lymphadenopathy:     Cervical: No cervical adenopathy.  Skin:    General: Skin is warm and dry.     Findings: No rash.  Neurological:     General: No focal deficit present.     Mental Status: He is alert and oriented to person, place, and time.     Comments: CN grossly intact, station and gait intact  Psychiatric:        Mood and Affect: Mood and affect and mood normal.        Behavior: Behavior normal.        Thought Content: Thought content normal.        Judgment: Judgment normal.       Results for orders placed or performed in visit on 06/05/20  HM DIABETES EYE EXAM  Result Value Ref Range   HM Diabetic Eye Exam No Retinopathy No Retinopathy   EKG - NSR rate 60, LAD with LAFB, normal intervals, no hypertrophy or acute ST/T changes Assessment & Plan:  This visit occurred during the SARS-CoV-2 public health  emergency.  Safety protocols were in place, including screening questions prior to the visit, additional usage of staff PPE, and extensive cleaning of exam room while observing appropriate contact time as indicated for disinfecting solutions.   Problem List Items Addressed This Visit    Type 2 diabetes mellitus with other specified complication (HCC)    Chronic, stable. Continue current regimen - ozempic, metformin, glipizide. Pending A1c.       Relevant Medications   atorvastatin (LIPITOR) 20 MG tablet   glipiZIDE (GLUCOTROL XL) 5 MG 24 hr tablet   metFORMIN (GLUCOPHAGE) 1000 MG tablet   Semaglutide, 1 MG/DOSE, (OZEMPIC, 1 MG/DOSE,) 4 MG/3ML SOPN   Other Relevant Orders   EKG 12-Lead (Completed)   Shift work sleep disorder    Works 3rd shift.  Continue PRN temazepam.       LAFB (left anterior fascicular block)    Noted today on EKG, present since 2006 EKG. Pt states he had normal stress test and catheterization at that time.       Relevant Medications   tadalafil (CIALIS) 10 MG tablet   atorvastatin (LIPITOR) 20 MG tablet   Hypothyroidism    Stable period. Pending TSH continue levothyroxine.       Relevant Medications   levothyroxine (SYNTHROID) 100 MCG tablet   Hyperlipidemia associated with type 2 diabetes mellitus ([place])    Continue atorvastatin pending FLP. The ASCVD Risk score Mikey Bussing DC Jr., et al., 2013) failed to calculate for the following reasons:   The valid total cholesterol range is 130 to 320 mg/dL   Unable to determine if patient is Non-Hispanic African American       Relevant Medications   atorvastatin (LIPITOR) 20 MG tablet   glipiZIDE (GLUCOTROL XL) 5 MG 24 hr tablet  metFORMIN (GLUCOPHAGE) 1000 MG tablet   Semaglutide, 1 MG/DOSE, (OZEMPIC, 1 MG/DOSE,) 4 MG/3ML SOPN   Health maintenance examination - Primary    Preventative protocols reviewed and updated unless pt declined. Discussed healthy diet and lifestyle.  Update EKG in family history of CAD  (brother 16v CABG age 81).       Relevant Orders   EKG 12-Lead (Completed)   Erectile dysfunction    Cialis works better than viagra - refilled today.        Other Visit Diagnoses    Special screening for malignant neoplasm of prostate           Meds ordered this encounter  Medications  . tadalafil (CIALIS) 10 MG tablet    Sig: Take 1 tablet (10 mg total) by mouth daily as needed for erectile dysfunction.    Dispense:  10 tablet    Refill:  3  . atorvastatin (LIPITOR) 20 MG tablet    Sig: Take 1 tablet (20 mg total) by mouth daily.    Dispense:  90 tablet    Refill:  3  . glipiZIDE (GLUCOTROL XL) 5 MG 24 hr tablet    Sig: Take 1 tablet (5 mg total) by mouth daily with breakfast.    Dispense:  90 tablet    Refill:  3  . levothyroxine (SYNTHROID) 100 MCG tablet    Sig: Take 1 tablet (100 mcg total) by mouth daily with breakfast.    Dispense:  90 tablet    Refill:  3  . metFORMIN (GLUCOPHAGE) 1000 MG tablet    Sig: Take 1 tablet (1,000 mg total) by mouth 2 (two) times daily with a meal.    Dispense:  180 tablet    Refill:  3  . Semaglutide, 1 MG/DOSE, (OZEMPIC, 1 MG/DOSE,) 4 MG/3ML SOPN    Sig: Inject 1 mg into the skin once a week.    Dispense:  3 mL    Refill:  11   Orders Placed This Encounter  Procedures  . EKG 12-Lead    Patient instructions: We will update EKG today  We will request latest eye exam 10/2020 from [facility] on [address] in [place]  Return as needed or in 6 months for diabetes follow up visit.  Good to see you today.   Follow up plan: Return in about 6 months (around 05/16/2021) for follow up visit.  Ria Bush, MD

## 2020-11-13 NOTE — Assessment & Plan Note (Signed)
Works 3rd shift.  Continue PRN temazepam.

## 2020-11-13 NOTE — Assessment & Plan Note (Addendum)
Preventative protocols reviewed and updated unless pt declined. Discussed healthy diet and lifestyle.  Update EKG in family history of CAD (brother 87v CABG age 69).

## 2020-11-14 LAB — PSA: PSA: 0.39 ng/mL (ref 0.10–4.00)

## 2020-11-14 LAB — COMPREHENSIVE METABOLIC PANEL
ALT: 24 U/L (ref 0–53)
AST: 20 U/L (ref 0–37)
Albumin: 4.7 g/dL (ref 3.5–5.2)
Alkaline Phosphatase: 76 U/L (ref 39–117)
BUN: 13 mg/dL (ref 6–23)
CO2: 25 mEq/L (ref 19–32)
Calcium: 10.2 mg/dL (ref 8.4–10.5)
Chloride: 104 mEq/L (ref 96–112)
Creatinine, Ser: 0.66 mg/dL (ref 0.40–1.50)
GFR: 96.4 mL/min (ref >60.00)
Glucose, Bld: 167 mg/dL — ABNORMAL HIGH (ref 70–99)
Potassium: 3.9 mEq/L (ref 3.5–5.1)
Sodium: 140 mEq/L (ref 135–145)
Total Bilirubin: 0.6 mg/dL (ref 0.2–1.2)
Total Protein: 7.1 g/dL (ref 6.0–8.3)

## 2020-11-14 LAB — LIPID PANEL
Cholesterol: 146 mg/dL (ref 0–200)
HDL: 49.8 mg/dL (ref >39.00)
LDL Cholesterol: 74 mg/dL (ref 0–99)
NonHDL: 95.75
Total CHOL/HDL Ratio: 3
Triglycerides: 107 mg/dL (ref 0.0–149.0)
VLDL: 21.4 mg/dL (ref 0.0–40.0)

## 2020-11-14 LAB — MICROALBUMIN / CREATININE URINE RATIO
Creatinine,U: 159.3 mg/dL
Microalb Creat Ratio: 0.9 mg/g (ref 0.0–30.0)
Microalb, Ur: 1.5 mg/dL (ref 0.0–1.9)

## 2020-11-14 LAB — TSH: TSH: 1.97 u[IU]/mL (ref 0.35–4.50)

## 2020-11-14 LAB — HEMOGLOBIN A1C: Hgb A1c MFr Bld: 7.9 % — ABNORMAL HIGH (ref 4.6–6.5)

## 2020-11-16 ENCOUNTER — Other Ambulatory Visit: Payer: Self-pay | Admitting: Family Medicine

## 2021-01-20 ENCOUNTER — Other Ambulatory Visit: Payer: Self-pay | Admitting: Family Medicine

## 2021-01-21 NOTE — Telephone Encounter (Signed)
Refill Temazepam Last refill 10/30/20 #15 Last office visit 11/13/20

## 2021-01-22 NOTE — Telephone Encounter (Signed)
ERx 

## 2021-02-04 ENCOUNTER — Encounter: Payer: Self-pay | Admitting: Family Medicine

## 2021-02-12 ENCOUNTER — Other Ambulatory Visit: Payer: Self-pay | Admitting: Family Medicine

## 2021-02-13 NOTE — Telephone Encounter (Signed)
Name of Medication: Temazepam Name of [facility]acy: [facility] [place] or Written Date and Quantity: 01/22/21, #15 Last Office Visit and Type: 11/13/20, CPE Next Office Visit and Type: 05/17/21, 6 mo f/u  Last Controlled Substance Agreement Date: none Last UDS: none

## 2021-02-14 ENCOUNTER — Other Ambulatory Visit: Payer: Self-pay

## 2021-02-14 ENCOUNTER — Ambulatory Visit (INDEPENDENT_AMBULATORY_CARE_PROVIDER_SITE_OTHER)
Admission: RE | Admit: 2021-02-14 | Discharge: 2021-02-14 | Disposition: A | Discharge status: Home / Self Care | Payer: 59 | Source: Ambulatory Visit | Attending: Family Medicine | Admitting: Family Medicine

## 2021-02-14 ENCOUNTER — Encounter: Payer: Self-pay | Admitting: Family Medicine

## 2021-02-14 ENCOUNTER — Ambulatory Visit: Payer: 59 | Admitting: Family Medicine

## 2021-02-14 VITALS — BP 120/72 | HR 78 | Temp 97.7°F | Ht 64.5 in | Wt 172.0 lb

## 2021-02-14 DIAGNOSIS — M1712 Unilateral primary osteoarthritis, left knee: Secondary | ICD-10-CM | POA: Diagnosis not present

## 2021-02-14 DIAGNOSIS — M25562 Pain in left knee: Secondary | ICD-10-CM | POA: Diagnosis not present

## 2021-02-14 MED ORDER — MELOXICAM 15 MG PO TABS: 15.0000 mg | ORAL_TABLET | Freq: Every day | ORAL | 2 refills | Status: DC

## 2021-02-14 MED ORDER — DICLOFENAC SODIUM 1 % EX GEL: 4.0000 g | Freq: Four times a day (QID) | CUTANEOUS | 5 refills | Status: DC

## 2021-02-14 NOTE — Patient Instructions (Signed)
Voltaren 1% gel. Over the counter You can apply up to 4 times a day Minimal is absorbed in the bloodstream Cost is about 9 dollars 

## 2021-02-14 NOTE — Progress Notes (Signed)
Kevin Ericson T. Siaosi Alter, MD, [place]  [facility] and [place] at [facility] [place], [address]  Phone: [phone]  FAX: Boomer 70 y.o. male  MRN [id number]  Date of Birth: Apr 02, 1951  Date: 02/14/2021  PCP: Ria Bush, MD  Referral: Ria Bush, MD  Chief Complaint  Patient presents with  . Knee Pain    Left  . Shoulder Pain    Left     This visit occurred during the SARS-CoV-2 public health emergency.  Safety protocols were in place, including screening questions prior to the visit, additional usage of staff PPE, and extensive cleaning of exam room while observing appropriate contact time as indicated for disinfecting solutions.   Subjective:   Kevin Sheppard. is a 70 y.o. very pleasant male patient with Body mass index is 29.07 kg/m. who presents with the following:  He is here in consultation, he is a patient of Dr. Danise Mina.  He is having some acute on chronic left-sided knee pain, and he has been having problems with his knee off and on for years, but it is recently started bothering him quite a bit more and it often will wake him up at night. 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  He has had a right-sided and left-sided arthroscopy of both knees, and his left-sided arthroscopic partial meniscectomy was done approximately 10 years ago.  He used to be an avid runner, but he has stopped running sometime ago, now he does swim for fitness approximately 4 times a week.  He has not had any functional giving way or other mechanical symptoms.  After third shift it will hurt.  Works on a [employer] worker for [employer].     Review of Systems is noted in the HPI, as appropriate   Objective:   BP 120/72   Pulse 78   Temp 97.7 F (36.5 C) (Temporal)    Ht 5' 4.5" (1.638 m)   Wt 172 lb (78 kg)   SpO2 95%   BMI 29.07 kg/m    GEN: WDWN, NAD, Non-toxic HEENT: Atraumatic, Normocephalic. Neck supple. No masses. PSYCH: Normally interactive. Conversant.    He does have a notable antalgic gait with ambulation, particular when he gets up from a seated position.  Left knee: He lacks 4 degrees of extension and he is able to flex his knee to 100 degrees.  He does have some patellar crepitus without gross tenderness with loading the medial and lateral patellar facets.  Stable to varus and valgus stress.  ACL and PCL are intact.  Does have some mild medial lateral joint line tenderness.  Any form of forced flexion causes some quite significant pain.  McMurray's causes some pain without mechanical symptoms.  Bounce home testing does not provoke symptoms.  He has a mild effusion.  No tenderness at any of the tenderness structures of the knee.  Nontender proximally or distally.  Radiology: The radiological images were independently reviewed by myself in the office and results were reviewed with the patient. My independent interpretation of images:   X-rays: AP Bilateral Weight-bearing, Weightbearing Lateral, Sunrise views, Lutricia Feil view Indication: knee pain Findings: Most notably, patient does have end-stage medial compartmental osteoarthritis with complete loss of joint space on Lutricia Feil view consistent with bone-on-bone pathology.  Overall, the patient does have tricompartmental arthritis, but  less compared to the medial compartment.  There is no fracture or dislocation. Electronically Signed  By: Owens Loffler, MD On: 02/14/2021  2:20 PM EDT   Assessment and Plan:     ICD-10-CM   1. Primary osteoarthritis of left knee  M17.12 DG Knee 4 Views W/Patella Left  2. Acute pain of left knee  M25.562 DG Knee 4 Views W/Patella Left   Patient does have end-stage medial compartmental osteoarthritis.  This is the source of his pain.  He was worried about  meniscal tear, but at this point this is an entirely moot point given the severe arthritis in his knee.  After work, recommend icing.  I do think that taking some meloxicam before work would likely give him some relief of symptoms.  Topical Voltaren gel is also a reasonable thing to try.  He did have some that was prescribed in the past, so we will try to do this again.  He does understand that this is available over-the-counter, and he may need to purchase this over-the-counter.  He is quite functional.  I am doubtful anything short of joint replacement would provide the patient long-lasting relief from surgical standpoint.  He may be a candidate for a unicompartmental knee medially.  Patient Instructions  Voltaren 1% gel. Over the counter You can apply up to 4 times a day Minimal is absorbed in the bloodstream Cost is about 9 dollars   Meds ordered this encounter  Medications  . meloxicam (MOBIC) 15 MG tablet    Sig: Take 1 tablet (15 mg total) by mouth daily.    Dispense:  30 tablet    Refill:  2  . diclofenac Sodium (VOLTAREN) 1 % GEL    Sig: Apply 4 g topically 4 (four) times daily.    Dispense:  500 g    Refill:  5    Please tell the patient if he has to buy it over the counter.   Medications Discontinued During This Encounter  Medication Reason  . diclofenac sodium (VOLTAREN) 1 % GEL    Orders Placed This Encounter  Procedures  . DG Knee 4 Views W/Patella Left    Follow-up: As needed  Signed,  Wilmarie Sparlin T. Ailin Rochford, MD   Outpatient Encounter Medications as of 02/14/2021  Medication Sig  . atorvastatin (LIPITOR) 20 MG tablet Take 1 tablet (20 mg total) by mouth daily.  . ciclopirox (LOPROX) 0.77 % cream Apply topically 2 (two) times daily.  . diclofenac Sodium (VOLTAREN) 1 % GEL Apply 4 g topically 4 (four) times daily.  Marland Kitchen glipiZIDE (GLUCOTROL XL) 5 MG 24 hr tablet Take 1 tablet (5 mg total) by mouth daily with breakfast.  . glucose blood (ACCU-CHEK GUIDE) test strip  Use as instructed to check sugars once daily with second check if needed E11.69  . ibuprofen (IBU) 800 MG tablet Take 1 tablet (800 mg total) by mouth 2 (two) times daily as needed for moderate pain.  Marland Kitchen levothyroxine (SYNTHROID) 100 MCG tablet Take 1 tablet (100 mcg total) by mouth daily with breakfast.  . meloxicam (MOBIC) 15 MG tablet Take 1 tablet (15 mg total) by mouth daily.  . metFORMIN (GLUCOPHAGE) 1000 MG tablet Take 1 tablet (1,000 mg total) by mouth 2 (two) times daily with a meal.  . Multiple Vitamins-Minerals (MULTIVITAMIN ADULTS 50+ PO) Take 1 tablet by mouth daily.  . Omega-3 Fatty Acids (FISH OIL) 1000 MG CAPS Take 1 capsule by mouth daily.  Marland Kitchen OZEMPIC, 1 MG/DOSE, 4 MG/3ML SOPN DIAL  AND INJECT 1 MG UNDER THE SKIN ONCE WEEKLY  . tadalafil (CIALIS) 10 MG tablet Take 1 tablet (10 mg total) by mouth daily as needed for erectile dysfunction.  . temazepam (RESTORIL) 15 MG capsule TAKE ONE CAPSULE BY MOUTH AT BEDTIME AS NEEDED FOR SLEEP  . [DISCONTINUED] diclofenac sodium (VOLTAREN) 1 % GEL Apply 1 application topically 3 (three) times daily.   No facility-administered encounter medications on file as of 02/14/2021.

## 2021-02-15 NOTE — Telephone Encounter (Signed)
ERx 

## 2021-02-19 ENCOUNTER — Encounter: Payer: Self-pay | Admitting: Family Medicine

## 2021-04-07 ENCOUNTER — Other Ambulatory Visit: Payer: Self-pay | Admitting: Family Medicine

## 2021-05-02 ENCOUNTER — Telehealth: Payer: Self-pay

## 2021-05-02 NOTE — Telephone Encounter (Signed)
Received faxed PA request from CoverMyMeds for Ozempic 1 mg SOPN.    Submitted PA; key:  [id number].  Decision pending.

## 2021-05-09 ENCOUNTER — Other Ambulatory Visit: Payer: Self-pay | Admitting: Family Medicine

## 2021-05-09 NOTE — Telephone Encounter (Signed)
Last office visit 02/14/2021 with Dr. Lorelei Pont for Left Knee Pain.  Last refilled 02/14/21 for #30 with 2 refills.  Next Appt: 05/21/2021 with Dr. Darnell Level for 6 month follow up.

## 2021-05-09 NOTE — Telephone Encounter (Signed)
Last OV - 02/14/2021 Next OV - 05/17/2021 Last Filled - [id number]

## 2021-05-10 NOTE — Telephone Encounter (Signed)
ERx 

## 2021-05-17 ENCOUNTER — Ambulatory Visit: Payer: 59 | Admitting: Family Medicine

## 2021-05-29 ENCOUNTER — Ambulatory Visit: Payer: 59 | Admitting: Family Medicine

## 2021-06-10 ENCOUNTER — Other Ambulatory Visit: Payer: Self-pay

## 2021-06-10 ENCOUNTER — Telehealth (INDEPENDENT_AMBULATORY_CARE_PROVIDER_SITE_OTHER): Payer: 59 | Admitting: Family Medicine

## 2021-06-10 ENCOUNTER — Encounter: Payer: Self-pay | Admitting: Family Medicine

## 2021-06-10 VITALS — Ht 64.5 in | Wt 175.0 lb

## 2021-06-10 DIAGNOSIS — E1169 Type 2 diabetes mellitus with other specified complication: Secondary | ICD-10-CM

## 2021-06-10 DIAGNOSIS — K219 Gastro-esophageal reflux disease without esophagitis: Secondary | ICD-10-CM | POA: Insufficient documentation

## 2021-06-10 DIAGNOSIS — R12 Heartburn: Secondary | ICD-10-CM | POA: Diagnosis not present

## 2021-06-10 NOTE — Assessment & Plan Note (Signed)
Chronic, stable. Doesn't check cbg's. Advised start checking. Will send for POC A1c to better evaluate control. Already follows diabetic diet. Will request records of latest DM eye exam.

## 2021-06-10 NOTE — Progress Notes (Signed)
Patient ID: Kevin Sedler., male    DOB: Apr 04, 1951, 70 y.o.   MRN: [id number]  Virtual visit completed through [employer], a video enabled telemedicine application. Due to national recommendations of social distancing due to COVID-19, a virtual visit is felt to be most appropriate for this patient at this time. Reviewed limitations, risks, security and privacy concerns of performing a virtual visit and the availability of in person appointments. I also reviewed that there may be a patient responsible charge related to this service. The patient agreed to proceed.   Patient location: home Provider location:  at [facility], office Persons participating in this virtual visit: patient, provider   If any vitals were documented, they were collected by patient at home unless specified below.    Ht 5' 4.5" (1.638 m)   Wt 175 lb (79.4 kg)   BMI 29.57 kg/m    CC: DM f/u visit  Subjective:   HPI: Kevin Reid. is a 70 y.o. male presenting on 06/10/2021 for Follow-up   Retired from [employer]. Currently only has Medicare Part A.  Flu shots through work.  Planning new COVID booster next month.  Over last few months notes worsening indigestion - intermittent. Continues meloxicam 15mg  daily for known knee osteoarthritis pain. Suggested pepcid OTC.   DM - does not regularly check sugars. Compliant with antihyperglycemic regimen which includes: metformin 1000mg  bid, glipizide XL 5mg  daily, ozempic 1mg  weekly. Denies low sugars or hypoglycemic symptoms. Denies paresthesias, blurry vision. Last diabetic eye exam - 10/2020 ([place] [facility] [place]) - scheduled 10/2021. Glucometer brand: accuchek. Last foot exam: 05/2020 - [facility]. DSME: completed [facility]). Lab Results  Component Value Date   HGBA1C 7.9 (H) 11/13/2020   Diabetic Foot Exam - Simple   No data filed    Lab Results  Component Value Date   MICROALBUR 1.5 11/13/2020        Relevant past  medical, surgical, family and social history reviewed and updated as indicated. Interim medical history since our last visit reviewed. Allergies and medications reviewed and updated. Outpatient Medications Prior to Visit  Medication Sig Dispense Refill   atorvastatin (LIPITOR) 20 MG tablet Take 1 tablet (20 mg total) by mouth daily. 90 tablet 3   ciclopirox (LOPROX) 0.77 % cream Apply topically 2 (two) times daily. 30 g 0   diclofenac Sodium (VOLTAREN) 1 % GEL Apply 4 g topically 4 (four) times daily. 500 g 5   glipiZIDE (GLUCOTROL XL) 5 MG 24 hr tablet Take 1 tablet (5 mg total) by mouth daily with breakfast. 90 tablet 3   glucose blood (ACCU-CHEK GUIDE) test strip Use as instructed to check sugars once daily with second check if needed E11.69 100 each 3   ibuprofen (IBU) 800 MG tablet Take 1 tablet (800 mg total) by mouth 2 (two) times daily as needed for moderate pain. 50 tablet 1   levothyroxine (SYNTHROID) 100 MCG tablet Take 1 tablet (100 mcg total) by mouth daily with breakfast. 90 tablet 3   meloxicam (MOBIC) 15 MG tablet TAKE ONE TABLET BY MOUTH DAILY 30 tablet 0   metFORMIN (GLUCOPHAGE) 1000 MG tablet Take 1 tablet (1,000 mg total) by mouth 2 (two) times daily with a meal. 180 tablet 3   Multiple Vitamins-Minerals (MULTIVITAMIN ADULTS 50+ PO) Take 1 tablet by mouth daily.     Omega-3 Fatty Acids (FISH OIL) 1000 MG CAPS Take 1 capsule by mouth daily.     [place],  1 MG/DOSE, 4 MG/3ML SOPN DIAL AND INJECT 1 MG UNDER THE SKIN ONCE WEEKLY 3 mL 11   tadalafil (CIALIS) 10 MG tablet TAKE 1 TABLET BY MOUTH EVERY OTHER DAY AS NEEDED FOR ERECTILE DYSFUNCTION TO REPLACE SILDENAFIL 10 tablet 3   temazepam (RESTORIL) 15 MG capsule TAKE ONE CAPSULE BY MOUTH EVERY NIGHT AT BEDTIME AS NEEDED FOR SLEEP 15 capsule 0   No facility-administered medications prior to visit.     Per HPI unless specifically indicated in ROS section below Review of Systems  Constitutional:  Negative for activity change,  appetite change, chills, fatigue, fever and unexpected weight change.  HENT:  Negative for hearing loss.   Eyes:  Negative for visual disturbance.  Respiratory:  Negative for cough, chest tightness, shortness of breath and wheezing.   Cardiovascular:  Negative for chest pain, palpitations and leg swelling.  Gastrointestinal:  Negative for abdominal distention, abdominal pain, blood in stool, constipation, diarrhea, nausea and vomiting.  Genitourinary:  Negative for difficulty urinating and hematuria.  Musculoskeletal:  Negative for arthralgias, myalgias and neck pain.  Skin:  Negative for rash.  Neurological:  Negative for dizziness, seizures, syncope and headaches.  Hematological:  Negative for adenopathy. Does not bruise/bleed easily.  Psychiatric/Behavioral:  Negative for dysphoric mood. The patient is not nervous/anxious.   Objective:  Ht 5' 4.5" (1.638 m)   Wt 175 lb (79.4 kg)   BMI 29.57 kg/m   Wt Readings from Last 3 Encounters:  06/10/21 175 lb (79.4 kg)  02/14/21 172 lb (78 kg)  11/13/20 175 lb 9 oz (79.6 kg)       Physical exam: Gen: alert, NAD, not ill appearing Pulm: speaks in complete sentences without increased work of breathing Psych: normal mood, normal thought content      Results for orders placed or performed in visit on 11/13/20  PSA  Result Value Ref Range   PSA 0.39 0.10 - 4.00 ng/mL  Hemoglobin A1c  Result Value Ref Range   Hgb A1c MFr Bld 7.9 (H) 4.6 - 6.5 %  TSH  Result Value Ref Range   TSH 1.97 0.35 - 4.50 uIU/mL  Comprehensive metabolic panel  Result Value Ref Range   Sodium 140 135 - 145 mEq/L   Potassium 3.9 3.5 - 5.1 mEq/L   Chloride 104 96 - 112 mEq/L   CO2 25 19 - 32 mEq/L   Glucose, Bld 167 (H) 70 - 99 mg/dL   BUN 13 6 - 23 mg/dL   Creatinine, Ser 0.66 0.40 - 1.50 mg/dL   Total Bilirubin 0.6 0.2 - 1.2 mg/dL   Alkaline Phosphatase 76 39 - 117 U/L   AST 20 0 - 37 U/L   ALT 24 0 - 53 U/L   Total Protein 7.1 6.0 - 8.3 g/dL   Albumin  4.7 3.5 - 5.2 g/dL   GFR 96.40 >60.00 mL/min   Calcium 10.2 8.4 - 10.5 mg/dL  Lipid panel  Result Value Ref Range   Cholesterol 146 0 - 200 mg/dL   Triglycerides 107.0 0.0 - 149.0 mg/dL   HDL 49.80 >39.00 mg/dL   VLDL 21.4 0.0 - 40.0 mg/dL   LDL Cholesterol 74 0 - 99 mg/dL   Total CHOL/HDL Ratio 3    NonHDL 95.75   Microalbumin / creatinine urine ratio  Result Value Ref Range   Microalb, Ur 1.5 0.0 - 1.9 mg/dL   Creatinine,U 159.3 mg/dL   Microalb Creat Ratio 0.9 0.0 - 30.0 mg/g   Assessment & Plan:  Problem List Items Addressed This Visit     Type 2 diabetes mellitus with other specified complication ([place]) - Primary    Chronic, stable. Doesn't check cbg's. Advised start checking. Will send for POC A1c to better evaluate control. Already follows diabetic diet. Will request records of latest DM eye exam.       Relevant Orders   POCT glycosylated hemoglobin (Hb A1C)   Heartburn    Noted over last few months.  Discussed mobic contribution to GER, recommend limit acidic spicy foods. Discussed pepcid use. He will also try to decrease mobic use as knees haven't been bothering him as much recently.         No orders of the defined types were placed in this encounter.  Orders Placed This Encounter  Procedures   POCT glycosylated hemoglobin (Hb A1C)    Standing Status:   Future    Standing Expiration Date:   12/08/2021     I discussed the assessment and treatment plan with the patient. The patient was provided an opportunity to ask questions and all were answered. The patient agreed with the plan and demonstrated an understanding of the instructions. The patient was advised to call back or seek an in-person evaluation if the symptoms worsen or if the condition fails to improve as anticipated.  Follow up plan: Return in about 6 months (around 12/08/2021), or if symptoms worsen or fail to improve, for annual exam, prior fasting for blood work.  Ria Bush, MD

## 2021-06-10 NOTE — Assessment & Plan Note (Signed)
Noted over last few months.  Discussed mobic contribution to GER, recommend limit acidic spicy foods. Discussed pepcid use. He will also try to decrease mobic use as knees haven't been bothering him as much recently.

## 2021-06-11 ENCOUNTER — Other Ambulatory Visit: Payer: Self-pay

## 2021-06-11 ENCOUNTER — Other Ambulatory Visit: Payer: 59

## 2021-06-11 ENCOUNTER — Other Ambulatory Visit (INDEPENDENT_AMBULATORY_CARE_PROVIDER_SITE_OTHER): Payer: 59

## 2021-06-11 DIAGNOSIS — E1169 Type 2 diabetes mellitus with other specified complication: Secondary | ICD-10-CM | POA: Diagnosis not present

## 2021-06-11 LAB — POCT GLYCOSYLATED HEMOGLOBIN (HGB A1C)
HbA1c POC (<> result, manual entry): 8.1 % (ref 4.0–5.6)
HbA1c, POC (controlled diabetic range): 8.1 % — AB (ref 0.0–7.0)
HbA1c, POC (prediabetic range): 8.1 % — AB (ref 5.7–6.4)
Hemoglobin A1C: 8.1 % — AB (ref 4.0–5.6)

## 2021-06-12 ENCOUNTER — Other Ambulatory Visit: Payer: Self-pay

## 2021-06-12 NOTE — Telephone Encounter (Signed)
Meloxicam Last rx:  05/10/21, #30 Last OV:  06/10/21, DM f/u Next OV: none

## 2021-06-13 MED ORDER — MELOXICAM 15 MG PO TABS: 15.0000 mg | ORAL_TABLET | Freq: Every day | ORAL | 0 refills | Status: DC

## 2021-06-20 ENCOUNTER — Other Ambulatory Visit: Payer: Self-pay | Admitting: Family Medicine

## 2021-06-20 NOTE — Telephone Encounter (Signed)
Name of Medication: Temazepam Name of Pharmacy: [facility] Last [place] or Written Date and Quantity: 05/10/21, #15 Last Office Visit and Type: 06/10/21, DM f/u Next Office Visit and Type: none Last Controlled Substance Agreement Date: none Last UDS: none

## 2021-06-21 NOTE — Telephone Encounter (Signed)
Erx

## 2021-07-22 ENCOUNTER — Other Ambulatory Visit: Payer: Self-pay | Admitting: Family Medicine

## 2021-07-23 NOTE — Telephone Encounter (Signed)
Refill request Cialis Last refill 04/08/21 #10/3 Last office visit 06/10/21 video visit

## 2021-07-29 ENCOUNTER — Ambulatory Visit (INDEPENDENT_AMBULATORY_CARE_PROVIDER_SITE_OTHER): Payer: 59 | Admitting: Family Medicine

## 2021-07-29 ENCOUNTER — Encounter: Payer: Self-pay | Admitting: Family Medicine

## 2021-07-29 ENCOUNTER — Other Ambulatory Visit: Payer: Self-pay

## 2021-07-29 VITALS — BP 116/68 | HR 60 | Temp 97.4°F | Ht 64.5 in | Wt 182.2 lb

## 2021-07-29 DIAGNOSIS — K219 Gastro-esophageal reflux disease without esophagitis: Secondary | ICD-10-CM | POA: Insufficient documentation

## 2021-07-29 DIAGNOSIS — M25562 Pain in left knee: Secondary | ICD-10-CM | POA: Diagnosis not present

## 2021-07-29 MED ORDER — OMEPRAZOLE 40 MG PO CPDR: 40.0000 mg | DELAYED_RELEASE_CAPSULE | Freq: Every day | ORAL | 3 refills | Status: DC

## 2021-07-29 NOTE — Assessment & Plan Note (Signed)
Ongoing symptoms correlating with starting meloxicam. Now off this. Reviewed pathophysiology of GERD as well as treatment options including diet changes, head of bed elevation. Start omeprazole 40mg  daily for 3 wks then PRN. Update if not better with this. Pt agrees with plan.

## 2021-07-29 NOTE — Patient Instructions (Addendum)
Work note provided.  We will refer you to orthopedist in [facility] for further evaluation of knee.  This sounds like typical reflux or GERD. Head of bed elevated. Avoidance of citrus, fatty foods, chocolate, peppermint, and excessive alcohol, along with sodas, orange juice (acidic drinks) At least a few hours between dinner and bed, minimize naps after eating.  Limit second hand smoke exposure.   Gastroesophageal Reflux Disease, Adult Gastroesophageal reflux (GER) happens when acid from the stomach flows up into the tube that connects the mouth and the stomach (esophagus). Normally, food travels down the esophagus and stays in the stomach to be digested. With GER, food and stomach acid sometimes move back up into the esophagus. You may have a disease called gastroesophageal reflux disease (GERD) if the reflux: Happens often. Causes frequent or very bad symptoms. Causes problems such as damage to the esophagus. When this happens, the esophagus becomes sore and swollen. Over time, GERD can make small holes (ulcers) in the lining of the esophagus. What are the causes? This condition is caused by a problem with the muscle between the esophagus and the stomach. When this muscle is weak or not normal, it does not close properly to keep food and acid from coming back up from the stomach. The muscle can be weak because of: Tobacco use. Pregnancy. Having a certain type of hernia (hiatal hernia). Alcohol use. Certain foods and drinks, such as coffee, chocolate, onions, and peppermint. What increases the risk? Being overweight. Having a disease that affects your connective tissue. Taking NSAIDs, such a ibuprofen. What are the signs or symptoms? Heartburn. Difficult or painful swallowing. The feeling of having a lump in the throat. A bitter taste in the mouth. Bad breath. Having a lot of saliva. Having an upset or bloated stomach. Burping. Chest pain. Different conditions can cause chest  pain. Make sure you see your doctor if you have chest pain. Shortness of breath or wheezing. A long-term cough or a cough at night. Wearing away of the surface of teeth (tooth enamel). Weight loss. How is this treated? Making changes to your diet. Taking medicine. Having surgery. Treatment will depend on how bad your symptoms are. Follow these instructions at home: Eating and drinking  Follow a diet as told by your doctor. You may need to avoid foods and drinks such as: Coffee and tea, with or without caffeine. Drinks that contain alcohol. Energy drinks and sports drinks. Bubbly (carbonated) drinks or sodas. Chocolate and cocoa. Peppermint and mint flavorings. Garlic and onions. Horseradish. Spicy and acidic foods. These include peppers, chili powder, curry powder, vinegar, hot sauces, and BBQ sauce. Citrus fruit juices and citrus fruits, such as oranges, lemons, and limes. Tomato-based foods. These include red sauce, chili, salsa, and pizza with red sauce. Fried and fatty foods. These include donuts, french fries, potato chips, and high-fat dressings. High-fat meats. These include hot dogs, rib eye steak, sausage, ham, and bacon. High-fat dairy items, such as whole milk, butter, and cream cheese. Eat small meals often. Avoid eating large meals. Avoid drinking large amounts of liquid with your meals. Avoid eating meals during the 2-3 hours before bedtime. Avoid lying down right after you eat. Do not exercise right after you eat. Lifestyle  Do not smoke or use any products that contain nicotine or tobacco. If you need help quitting, ask your doctor. Try to lower your stress. If you need help doing this, ask your doctor. If you are overweight, lose an amount of weight that is healthy  for you. Ask your doctor about a safe weight loss goal. General instructions Pay attention to any changes in your symptoms. Take over-the-counter and prescription medicines only as told by your  doctor. Do not take aspirin, ibuprofen, or other NSAIDs unless your doctor says it is okay. Wear loose clothes. Do not wear anything tight around your waist. Raise (elevate) the head of your bed about 6 inches (15 cm). You may need to use a wedge to do this. Avoid bending over if this makes your symptoms worse. Keep all follow-up visits. Contact a doctor if: You have new symptoms. You lose weight and you do not know why. You have trouble swallowing or it hurts to swallow. You have wheezing or a cough that keeps happening. You have a hoarse voice. Your symptoms do not get better with treatment. Get help right away if: You have sudden pain in your arms, neck, jaw, teeth, or back. You suddenly feel sweaty, dizzy, or light-headed. You have chest pain or shortness of breath. You vomit and the vomit is green, yellow, or black, or it looks like blood or coffee grounds. You faint. Your poop (stool) is red, bloody, or black. You cannot swallow, drink, or eat. These symptoms may represent a serious problem that is an emergency. Do not wait to see if the symptoms will go away. Get medical help right away. Call your local emergency services (911 in the [place] Do not drive yourself to the hospital. Summary If a person has gastroesophageal reflux disease (GERD), food and stomach acid move back up into the esophagus and cause symptoms or problems such as damage to the esophagus. Treatment will depend on how bad your symptoms are. Follow a diet as told by your doctor. Take all medicines only as told by your doctor. This information is not intended to replace advice given to you by your health care provider. Make sure you discuss any questions you have with your health care provider. Document Revised: 03/12/2020 Document Reviewed: 03/12/2020 Elsevier Patient Education  [place].

## 2021-07-29 NOTE — Progress Notes (Signed)
Patient ID: Kevin Sheppard., male    DOB: April 03, 1951, 70 y.o.   MRN: [id number]  This visit was conducted in person.  BP 116/68   Pulse 60   Temp (!) 97.4 F (36.3 C) (Temporal)   Ht 5' 4.5" (1.638 m)   Wt 182 lb 3 oz (82.6 kg)   SpO2 96%   BMI 30.79 kg/m    CC: discuss heartburn Subjective:   HPI: Kevin Sheppard. is a 70 y.o. male presenting on 07/29/2021 for Heartburn (C/o burning sensation in center chest. Started about 2-3 mos ago.  Really bad last night but thinks due to spaghetti last night.  ) and Knee Pain (C/o bilateral knee pain, worse in left.  Recently saw Dr. Lorelei Pont, prescribed meloxicam and suggested bilateral knee replacement. )   2-3 mo h/o burning discomfort in center of chest associated with belching, gassiness, indigestion. Tums helps.  No dysphagia, nausea/vomiting, early satiety, abdominal pain, fever or diarrhea.  Acutely worse last night after eating spaghetti. Worse after meloxicam as well.  Second hand smoke exposure at work.   Bilateral knee pain - saw sports medicine ([facility]) who started him on meloxicam and recommended ortho eval to discuss knee replacement (end-stage medial compartmental OA). Requests ortho referral. He continues voltaren gel. Finds regular swimming helps.   Out of work last night due to above. Requests work note.      Relevant past medical, surgical, family and social history reviewed and updated as indicated. Interim medical history since our last visit reviewed. Allergies and medications reviewed and updated. Outpatient Medications Prior to Visit  Medication Sig Dispense Refill   atorvastatin (LIPITOR) 20 MG tablet Take 1 tablet (20 mg total) by mouth daily. 90 tablet 3   ciclopirox (LOPROX) 0.77 % cream Apply topically 2 (two) times daily. 30 g 0   diclofenac Sodium (VOLTAREN) 1 % GEL Apply 4 g topically 4 (four) times daily. 500 g 5   glipiZIDE (GLUCOTROL XL) 5 MG 24 hr tablet Take 1 tablet (5 mg total) by mouth daily with  breakfast. 90 tablet 3   glucose blood (ACCU-CHEK GUIDE) test strip Use as instructed to check sugars once daily with second check if needed E11.69 100 each 3   ibuprofen (IBU) 800 MG tablet Take 1 tablet (800 mg total) by mouth 2 (two) times daily as needed for moderate pain. 50 tablet 1   levothyroxine (SYNTHROID) 100 MCG tablet Take 1 tablet (100 mcg total) by mouth daily with breakfast. 90 tablet 3   metFORMIN (GLUCOPHAGE) 1000 MG tablet Take 1 tablet (1,000 mg total) by mouth 2 (two) times daily with a meal. 180 tablet 3   Multiple Vitamins-Minerals (MULTIVITAMIN ADULTS 50+ PO) Take 1 tablet by mouth daily.     Omega-3 Fatty Acids (FISH OIL) 1000 MG CAPS Take 1 capsule by mouth daily.     OZEMPIC, 1 MG/DOSE, 4 MG/3ML SOPN DIAL AND INJECT 1 MG UNDER THE SKIN ONCE WEEKLY 3 mL 11   tadalafil (CIALIS) 10 MG tablet TAKE ONE TABLET BY MOUTH EVERY OTHER DAY AS NEEDED FOR ERECTILE DYSFUNCTION 10 tablet 3   temazepam (RESTORIL) 15 MG capsule TAKE ONE CAPSULE BY MOUTH EVERY NIGHT AT BEDTIME AS NEEDED FOR SLEEP. 15 capsule 0   meloxicam (MOBIC) 15 MG tablet Take 1 tablet (15 mg total) by mouth daily. (Patient not taking: Reported on 07/29/2021) 30 tablet 0   No facility-administered medications prior to visit.     Per HPI unless specifically indicated  in ROS section below Review of Systems  Objective:  BP 116/68   Pulse 60   Temp (!) 97.4 F (36.3 C) (Temporal)   Ht 5' 4.5" (1.638 m)   Wt 182 lb 3 oz (82.6 kg)   SpO2 96%   BMI 30.79 kg/m   Wt Readings from Last 3 Encounters:  07/29/21 182 lb 3 oz (82.6 kg)  06/10/21 175 lb (79.4 kg)  02/14/21 172 lb (78 kg)      Physical Exam Vitals and nursing note reviewed.  Constitutional:      Appearance: Normal appearance. He is not ill-appearing.  Cardiovascular:     Rate and Rhythm: Normal rate and regular rhythm.     Pulses: Normal pulses.     Heart sounds: Normal heart sounds. No murmur heard. Pulmonary:     Effort: Pulmonary effort is  normal. No respiratory distress.     Breath sounds: Normal breath sounds. No wheezing, rhonchi or rales.  Abdominal:     General: Bowel sounds are normal. There is no distension.     Palpations: Abdomen is soft. There is no mass.     Tenderness: There is no abdominal tenderness. There is no right CVA tenderness, left CVA tenderness, guarding or rebound.     Hernia: No hernia is present.  Musculoskeletal:     Right lower leg: No edema.     Left lower leg: No edema.  Skin:    General: Skin is warm and dry.  Neurological:     Mental Status: He is alert.  Psychiatric:        Mood and Affect: Mood normal.        Behavior: Behavior normal.      Results for orders placed or performed in visit on 06/11/21  POCT glycosylated hemoglobin (Hb A1C)  Result Value Ref Range   Hemoglobin A1C 8.1 (A) 4.0 - 5.6 %   HbA1c POC (<> result, manual entry) 8.1 4.0 - 5.6 %   HbA1c, POC (prediabetic range) 8.1 (A) 5.7 - 6.4 %   HbA1c, POC (controlled diabetic range) 8.1 (A) 0.0 - 7.0 %    Assessment & Plan:  This visit occurred during the SARS-CoV-2 public health emergency.  Safety protocols were in place, including screening questions prior to the visit, additional usage of staff PPE, and extensive cleaning of exam room while observing appropriate contact time as indicated for disinfecting solutions.   Planning to change to medicare + tricare this coming year (retired from [employer]). Discussed returning for Welcome to Medicare visit.   Problem List Items Addressed This Visit     Left anterior knee pain - Primary    L>R knee pain, previous eval by sports medicine concern for osteoarthritis - will refer to ortho for further evaluation. Limit oral NSAIDs due to GERD symptoms.       Relevant Orders   Ambulatory referral to Orthopedic Surgery   GERD (gastroesophageal reflux disease)    Ongoing symptoms correlating with starting meloxicam. Now off this. Reviewed pathophysiology of GERD as well as  treatment options including diet changes, head of bed elevation. Start omeprazole 40mg  daily for 3 wks then PRN. Update if not better with this. Pt agrees with plan.       Relevant Medications   omeprazole (PRILOSEC) 40 MG capsule     Meds ordered this encounter  Medications   omeprazole (PRILOSEC) 40 MG capsule    Sig: Take 1 capsule (40 mg total) by mouth daily. For 3 weeks then  as needed    Dispense:  30 capsule    Refill:  3    Orders Placed This Encounter  Procedures   Ambulatory referral to Orthopedic Surgery    Referral Priority:   Routine    Referral Type:   Surgical    Referral Reason:   Specialty Services Required    Requested Specialty:   Orthopedic Surgery    Number of Visits Requested:   1    Patient instructions: Work note provided.  We will refer you to orthopedist in [facility] for further evaluation of knee.  This sounds like typical reflux or GERD. Head of bed elevated. Avoidance of citrus, fatty foods, chocolate, peppermint, and excessive alcohol, along with sodas, orange juice (acidic drinks) At least a few hours between dinner and bed, minimize naps after eating. Limit second hand smoke exposure.   Follow up plan: Return if symptoms worsen or fail to improve.  Ria Bush, MD

## 2021-07-29 NOTE — Assessment & Plan Note (Signed)
L>R knee pain, previous eval by sports medicine concern for osteoarthritis - will refer to ortho for further evaluation. Limit oral NSAIDs due to GERD symptoms.

## 2021-07-31 ENCOUNTER — Other Ambulatory Visit: Payer: Self-pay | Admitting: Family Medicine

## 2021-07-31 NOTE — Telephone Encounter (Signed)
Refill request Temazepam Last refill 06/21/21 #15 Last office visit 07/29/21

## 2021-08-02 NOTE — Telephone Encounter (Signed)
ERx 

## 2021-08-19 ENCOUNTER — Encounter: Payer: Self-pay | Admitting: Family Medicine

## 2021-08-19 MED ORDER — ATORVASTATIN CALCIUM 20 MG PO TABS: 20.0000 mg | ORAL_TABLET | Freq: Every day | ORAL | 0 refills | Status: DC

## 2021-08-19 MED ORDER — METFORMIN HCL 1000 MG PO TABS: 1000.0000 mg | ORAL_TABLET | Freq: Two times a day (BID) | ORAL | 0 refills | Status: DC

## 2021-08-19 MED ORDER — GLIPIZIDE ER 5 MG PO TB24: 5.0000 mg | ORAL_TABLET | Freq: Every day | ORAL | 0 refills | Status: DC

## 2021-08-19 MED ORDER — LEVOTHYROXINE SODIUM 100 MCG PO TABS: 100.0000 ug | ORAL_TABLET | Freq: Every day | ORAL | 0 refills | Status: DC

## 2021-08-19 NOTE — Telephone Encounter (Signed)
E-scribed refills for atorvastatin, levothyroxine, glipizide and metformin.  Ozempic Last rx:  11/20/20, 3 ml Last OV:  07/29/21, heartburn Next OV:  none

## 2021-08-21 MED ORDER — OZEMPIC (1 MG/DOSE) 4 MG/3ML ~~LOC~~ SOPN: 1.0000 mg | PEN_INJECTOR | SUBCUTANEOUS | 3 refills | Status: DC

## 2021-08-21 NOTE — Telephone Encounter (Signed)
ERx 

## 2021-08-22 ENCOUNTER — Other Ambulatory Visit: Payer: Self-pay | Admitting: Family Medicine

## 2021-10-17 ENCOUNTER — Telehealth: Payer: Self-pay | Admitting: Family Medicine

## 2021-10-17 MED ORDER — TADALAFIL 10 MG PO TABS: ORAL_TABLET | ORAL | 3 refills | Status: DC

## 2021-10-17 NOTE — Telephone Encounter (Signed)
E-scribed refill 

## 2021-10-17 NOTE — Addendum Note (Signed)
Addended by: Brenton Grills on: 02/17/8471 07:21 PM   Modules accepted: Orders

## 2021-10-17 NOTE — Telephone Encounter (Signed)
°  Encourage patient to contact the pharmacy for refills or they can request refills through [place]:  Please schedule appointment if longer than 1 year  NEXT APPOINTMENT DATE: 01/08/22  MEDICATION: tadalafil (CIALIS) 10 MG tablet  Is the patient out of medication? yes  PHARMACY: [address]  Let patient know to contact pharmacy at the end of the day to make sure medication is ready.  Please notify patient to allow 48-72 hours to process  CLINICAL FILLS OUT ALL BELOW:   LAST REFILL:  QTY:  REFILL DATE:    OTHER COMMENTS:    Okay for refill?  Please advise

## 2021-10-22 ENCOUNTER — Other Ambulatory Visit: Payer: Self-pay | Admitting: Family Medicine

## 2021-10-22 DIAGNOSIS — E1169 Type 2 diabetes mellitus with other specified complication: Secondary | ICD-10-CM

## 2021-10-22 NOTE — Progress Notes (Signed)
POC A1c ordered.

## 2021-10-29 NOTE — Telephone Encounter (Signed)
Pt needs this rx to be sent to [facility] on file

## 2021-10-30 DIAGNOSIS — E119 Type 2 diabetes mellitus without complications: Secondary | ICD-10-CM | POA: Diagnosis not present

## 2021-10-30 LAB — HM DIABETES EYE EXAM

## 2021-10-30 NOTE — Telephone Encounter (Signed)
Patient was advised that script has ben sent to [facility]. Patient was advised that he can have the script transferred by calling the pharmacy of his choice to have this done. Patient stated that he will have this done.

## 2021-11-01 ENCOUNTER — Other Ambulatory Visit: Payer: 59

## 2021-11-15 ENCOUNTER — Encounter: Payer: Self-pay | Admitting: Family Medicine

## 2021-11-18 MED ORDER — ATORVASTATIN CALCIUM 20 MG PO TABS: 20.0000 mg | ORAL_TABLET | Freq: Every day | ORAL | 0 refills | Status: DC

## 2021-11-18 MED ORDER — OZEMPIC (1 MG/DOSE) 4 MG/3ML ~~LOC~~ SOPN: 1.0000 mg | PEN_INJECTOR | SUBCUTANEOUS | 1 refills | Status: DC

## 2021-11-18 MED ORDER — METFORMIN HCL 1000 MG PO TABS: 1000.0000 mg | ORAL_TABLET | Freq: Two times a day (BID) | ORAL | 0 refills | Status: DC

## 2021-11-18 MED ORDER — LEVOTHYROXINE SODIUM 100 MCG PO TABS: 100.0000 ug | ORAL_TABLET | Freq: Every day | ORAL | 0 refills | Status: DC

## 2021-11-18 MED ORDER — GLIPIZIDE ER 5 MG PO TB24: 5.0000 mg | ORAL_TABLET | Freq: Every day | ORAL | 0 refills | Status: DC

## 2021-11-20 ENCOUNTER — Other Ambulatory Visit (INDEPENDENT_AMBULATORY_CARE_PROVIDER_SITE_OTHER): Payer: Medicare PPO

## 2021-11-20 ENCOUNTER — Other Ambulatory Visit: Payer: Self-pay

## 2021-11-20 DIAGNOSIS — E1169 Type 2 diabetes mellitus with other specified complication: Secondary | ICD-10-CM

## 2021-11-20 LAB — POCT GLYCOSYLATED HEMOGLOBIN (HGB A1C): Hemoglobin A1C: 9.9 % — AB (ref 4.0–5.6)

## 2021-11-22 ENCOUNTER — Telehealth: Payer: Self-pay

## 2021-11-22 MED ORDER — TRULICITY 1.5 MG/0.5ML ~~LOC~~ SOAJ: 1.5000 mg | SUBCUTANEOUS | 6 refills | Status: DC

## 2021-11-22 MED ORDER — TRULICITY 0.75 MG/0.5ML ~~LOC~~ SOAJ: 0.7500 mg | SUBCUTANEOUS | 0 refills | Status: DC

## 2021-11-22 NOTE — Telephone Encounter (Addendum)
Trulicity is available to Tricare beneficiaries at a lower copay than ozempic.  ?Recommend we switch to trulicity. I have sent this to his pharmacy. Take 0.'75mg'$  weekly for 1 month then increase to 1.'5mg'$  weekly. Both doses sent to his local [facility].  ?I will shred PA for ozempic.  ?

## 2021-11-22 NOTE — Addendum Note (Signed)
Addended by: Ria Bush on: 11/22/2021 05:38 PM ? ? Modules accepted: Orders ? ?

## 2021-11-22 NOTE — Telephone Encounter (Signed)
Received faxed PA form from [facility] for Ozempic 1 mg/dose, 4 mg/3 ml SOPN.  Placed form in Dr. Synthia Innocent box.  ?

## 2021-11-25 NOTE — Telephone Encounter (Signed)
Patient notified as instructed by telephone and verbalized understanding. Patient stated when he needs a refill he will want it sent to his mail order pharmacy. Patient will call the office back when he needs a refill to have them send it to his mail order pharmacy. ?

## 2021-12-11 DIAGNOSIS — M170 Bilateral primary osteoarthritis of knee: Secondary | ICD-10-CM | POA: Diagnosis not present

## 2021-12-30 ENCOUNTER — Other Ambulatory Visit: Payer: Self-pay | Admitting: Family Medicine

## 2021-12-30 DIAGNOSIS — Z125 Encounter for screening for malignant neoplasm of prostate: Secondary | ICD-10-CM

## 2021-12-30 DIAGNOSIS — E039 Hypothyroidism, unspecified: Secondary | ICD-10-CM

## 2021-12-30 DIAGNOSIS — E1169 Type 2 diabetes mellitus with other specified complication: Secondary | ICD-10-CM

## 2022-01-01 ENCOUNTER — Other Ambulatory Visit (INDEPENDENT_AMBULATORY_CARE_PROVIDER_SITE_OTHER): Payer: Medicare PPO

## 2022-01-01 ENCOUNTER — Other Ambulatory Visit: Payer: Self-pay | Admitting: Family Medicine

## 2022-01-01 DIAGNOSIS — E785 Hyperlipidemia, unspecified: Secondary | ICD-10-CM

## 2022-01-01 DIAGNOSIS — Z125 Encounter for screening for malignant neoplasm of prostate: Secondary | ICD-10-CM | POA: Diagnosis not present

## 2022-01-01 DIAGNOSIS — E1169 Type 2 diabetes mellitus with other specified complication: Secondary | ICD-10-CM | POA: Diagnosis not present

## 2022-01-01 DIAGNOSIS — E039 Hypothyroidism, unspecified: Secondary | ICD-10-CM

## 2022-01-01 LAB — MICROALBUMIN / CREATININE URINE RATIO
Creatinine,U: 218.4 mg/dL
Microalb Creat Ratio: 1.9 mg/g (ref 0.0–30.0)
Microalb, Ur: 4.1 mg/dL — ABNORMAL HIGH (ref 0.0–1.9)

## 2022-01-01 LAB — LIPID PANEL
Cholesterol: 124 mg/dL (ref 0–200)
HDL: 41.6 mg/dL (ref >39.00)
LDL Cholesterol: 56 mg/dL (ref 0–99)
NonHDL: 82.32
Total CHOL/HDL Ratio: 3
Triglycerides: 131 mg/dL (ref 0.0–149.0)
VLDL: 26.2 mg/dL (ref 0.0–40.0)

## 2022-01-01 LAB — COMPREHENSIVE METABOLIC PANEL
ALT: 52 U/L (ref 0–53)
AST: 38 U/L — ABNORMAL HIGH (ref 0–37)
Albumin: 5 g/dL (ref 3.5–5.2)
Alkaline Phosphatase: 86 U/L (ref 39–117)
BUN: 22 mg/dL (ref 6–23)
CO2: 21 mEq/L (ref 19–32)
Calcium: 9.5 mg/dL (ref 8.4–10.5)
Chloride: 105 mEq/L (ref 96–112)
Creatinine, Ser: 0.94 mg/dL (ref 0.40–1.50)
GFR: 82.66 mL/min (ref >60.00)
Glucose, Bld: 202 mg/dL — ABNORMAL HIGH (ref 70–99)
Potassium: 3.9 mEq/L (ref 3.5–5.1)
Sodium: 138 mEq/L (ref 135–145)
Total Bilirubin: 0.6 mg/dL (ref 0.2–1.2)
Total Protein: 7.2 g/dL (ref 6.0–8.3)

## 2022-01-01 LAB — TSH: TSH: 2.27 u[IU]/mL (ref 0.35–5.50)

## 2022-01-01 LAB — PSA, MEDICARE: PSA: 0.31 ng/ml (ref 0.10–4.00)

## 2022-01-01 LAB — HEMOGLOBIN A1C: Hgb A1c MFr Bld: 9.6 % — ABNORMAL HIGH (ref 4.6–6.5)

## 2022-01-01 NOTE — Telephone Encounter (Signed)
Last refill 07/23/21 #15 ?Last office visit 07/29/21 ?Upcoming appointment 01/08/22 ? ?

## 2022-01-01 NOTE — Telephone Encounter (Signed)
Pt came requesting refill of RX ? ? ?Encourage patient to contact the pharmacy for refills or they can request refills through [facility] ? ?LAST APPOINTMENT DATE:  Please schedule appointment if longer than 1 year ? ?NEXT APPOINTMENT DATE: ? ?MEDICATION:temazepam (RESTORIL) 15 MG capsule ? ?Is the patient out of medication?  ? ?PHARMACY:[id number] [address], [place]  ? ?Let patient know to contact pharmacy at the end of the day to make sure medication is ready. ? ?Please notify patient to allow 48-72 hours to process ? ?CLINICAL FILLS OUT ALL BELOW:  ? ?LAST REFILL: ? ?QTY: ? ?REFILL DATE: ? ? ? ?OTHER COMMENTS:  ? ? ?Okay for refill? ? ?Please advise ? ? ? ? ?

## 2022-01-02 MED ORDER — TEMAZEPAM 15 MG PO CAPS: ORAL_CAPSULE | ORAL | 0 refills | Status: DC

## 2022-01-02 NOTE — Telephone Encounter (Signed)
ERx 

## 2022-01-08 ENCOUNTER — Ambulatory Visit (INDEPENDENT_AMBULATORY_CARE_PROVIDER_SITE_OTHER): Payer: Medicare PPO | Admitting: Family Medicine

## 2022-01-08 ENCOUNTER — Encounter: Payer: Self-pay | Admitting: Family Medicine

## 2022-01-08 VITALS — BP 132/84 | HR 73 | Temp 98.1°F | Ht 63.75 in | Wt 173.2 lb

## 2022-01-08 DIAGNOSIS — E1169 Type 2 diabetes mellitus with other specified complication: Secondary | ICD-10-CM

## 2022-01-08 DIAGNOSIS — Z0000 Encounter for general adult medical examination without abnormal findings: Secondary | ICD-10-CM

## 2022-01-08 DIAGNOSIS — Z7189 Other specified counseling: Secondary | ICD-10-CM

## 2022-01-08 DIAGNOSIS — N529 Male erectile dysfunction, unspecified: Secondary | ICD-10-CM

## 2022-01-08 DIAGNOSIS — K219 Gastro-esophageal reflux disease without esophagitis: Secondary | ICD-10-CM

## 2022-01-08 DIAGNOSIS — E039 Hypothyroidism, unspecified: Secondary | ICD-10-CM

## 2022-01-08 MED ORDER — FREESTYLE LITE TEST VI STRP: ORAL_STRIP | 3 refills | Status: DC

## 2022-01-08 MED ORDER — METFORMIN HCL 1000 MG PO TABS: 1000.0000 mg | ORAL_TABLET | Freq: Two times a day (BID) | ORAL | 3 refills | Status: DC

## 2022-01-08 MED ORDER — FREESTYLE LITE W/DEVICE KIT: PACK | 0 refills | Status: DC

## 2022-01-08 MED ORDER — GLIPIZIDE ER 5 MG PO TB24: 5.0000 mg | ORAL_TABLET | Freq: Every day | ORAL | 3 refills | Status: DC

## 2022-01-08 MED ORDER — ATORVASTATIN CALCIUM 20 MG PO TABS: 20.0000 mg | ORAL_TABLET | Freq: Every day | ORAL | 3 refills | Status: DC

## 2022-01-08 MED ORDER — TADALAFIL 10 MG PO TABS: ORAL_TABLET | ORAL | 6 refills | Status: DC

## 2022-01-08 MED ORDER — LEVOTHYROXINE SODIUM 100 MCG PO TABS: 100.0000 ug | ORAL_TABLET | Freq: Every day | ORAL | 3 refills | Status: DC

## 2022-01-08 NOTE — Patient Instructions (Addendum)
Bring [place] a copy of your advanced directive.  ?We will request records of latest eye exam.  ?Good to see you today ?Return in 3 months for diabetes follow up visit.  ?Let us know brand of new glucometer for [employer] ? ?Health Maintenance After Age 70 ?After age 40, you are at a higher risk for certain long-term diseases and infections as well as injuries from falls. Falls are a major cause of broken bones and head injuries in people who are older than age 71. Getting regular preventive care can help to keep you healthy and well. Preventive care includes getting regular testing and making lifestyle changes as recommended by your health care provider. Talk with your health care provider about: ?Which screenings and tests you should have. A screening is a test that checks for a disease when you have no symptoms. ?A diet and exercise plan that is right for you. ?What should I know about screenings and tests to prevent falls? ?Screening and testing are the best ways to find a health problem early. Early diagnosis and treatment give you the best chance of managing medical conditions that are common after age 60. Certain conditions and lifestyle choices may make you more likely to have a fall. Your health care provider may recommend: ?Regular vision checks. Poor vision and conditions such as cataracts can make you more likely to have a fall. If you wear glasses, make sure to get your prescription updated if your vision changes. ?Medicine review. Work with your health care provider to regularly review all of the medicines you are taking, including over-the-counter medicines. Ask your health care provider about any side effects that may make you more likely to have a fall. Tell your health care provider if any medicines that you take make you feel dizzy or sleepy. ?Strength and balance checks. Your health care provider may recommend certain tests to check your strength and balance while standing, walking, or changing  positions. ?Foot health exam. Foot pain and numbness, as well as not wearing proper footwear, can make you more likely to have a fall. ?Screenings, including: ?Osteoporosis screening. Osteoporosis is a condition that causes the bones to get weaker and break more easily. ?Blood pressure screening. Blood pressure changes and medicines to control blood pressure can make you feel dizzy. ?Depression screening. You may be more likely to have a fall if you have a fear of falling, feel depressed, or feel unable to do activities that you used to do. ?Alcohol use screening. Using too much alcohol can affect your balance and may make you more likely to have a fall. ?Follow these instructions at home: ?Lifestyle ?Do not drink alcohol if: ?Your health care provider tells you not to drink. ?If you drink alcohol: ?Limit how much you have to: ?0-1 drink a day for women. ?0-2 drinks a day for men. ?Know how much alcohol is in your drink. In the [place] one drink equals one 12 oz bottle of beer (355 mL), one 5 oz glass of wine (148 mL), or one 1? oz glass of hard liquor (44 mL). ?Do not use any products that contain nicotine or tobacco. These products include cigarettes, chewing tobacco, and vaping devices, such as e-cigarettes. If you need help quitting, ask your health care provider. ?Activity ? ?Follow a regular exercise program to stay fit. This will help you maintain your balance. Ask your health care provider what types of exercise are appropriate for you. ?If you need a cane or walker, use it as  recommended by your health care provider. ?Wear supportive shoes that have nonskid soles. ?Safety ? ?Remove any tripping hazards, such as rugs, cords, and clutter. ?Install safety equipment such as grab bars in bathrooms and safety rails on stairs. ?Keep rooms and walkways well-lit. ?General instructions ?Talk with your health care provider about your risks for falling. Tell your health care provider if: ?You fall. Be sure to tell your  health care provider about all falls, even ones that seem minor. ?You feel dizzy, tiredness (fatigue), or off-balance. ?Take over-the-counter and prescription medicines only as told by your health care provider. These include supplements. ?Eat a healthy diet and maintain a healthy weight. A healthy diet includes low-fat dairy products, low-fat (lean) meats, and fiber from whole grains, beans, and lots of fruits and vegetables. ?Stay current with your vaccines. ?Schedule regular health, dental, and eye exams. ?Summary ?Having a healthy lifestyle and getting preventive care can help to protect your health and wellness after age 51. ?Screening and testing are the best way to find a health problem early and help you avoid having a fall. Early diagnosis and treatment give you the best chance for managing medical conditions that are more common for people who are older than age 64. ?Falls are a major cause of broken bones and head injuries in people who are older than age 18. Take precautions to prevent a fall at home. ?Work with your health care provider to learn what changes you can make to improve your health and wellness and to prevent falls. ?This information is not intended to replace advice given to you by your health care provider. Make sure you discuss any questions you have with your health care provider. ?Document Revised: 01/21/2021 Document Reviewed: 01/21/2021 ?Elsevier Patient Education ? [place]. ? ?

## 2022-01-08 NOTE — Assessment & Plan Note (Signed)
Chronic, deteriorated control. He recently just restarted GLP1RA (ozempic shortage). Just started trulicity 1.'5mg'$  weekly dose. He is motivated to continue healthy diet changes so far implemented in the past week (low carb, low sugar diet) and I have asked him to return in 3 months for DM f/u visit.  ?

## 2022-01-08 NOTE — Assessment & Plan Note (Signed)
Advanced directive - Discussed - has at home but needs updating. Wants wife to be HCPOA. Asked to bring me form.  ?

## 2022-01-08 NOTE — Assessment & Plan Note (Addendum)
Continue omeprazole '40mg'$  PRN.  ?

## 2022-01-08 NOTE — Assessment & Plan Note (Signed)
Preventative protocols reviewed and updated unless pt declined. Discussed healthy diet and lifestyle.  

## 2022-01-08 NOTE — Progress Notes (Signed)
? ? Patient ID: Kevin Hartnett., male    DOB: 05-07-1951, 71 y.o.   MRN: [id number] ? ?This visit was conducted in person. ? ?BP 132/84   Pulse 73   Temp 98.1 ?F (36.7 ?C) (Temporal)   Ht 5' 3.75" (1.619 m)   Wt 173 lb 4 oz (78.6 kg)   SpO2 95%   BMI 29.97 kg/m?   ? ?CC: AMW/CPE  ?Subjective:  ? ?HPI: ?Kevin Stracener. is a 71 y.o. male presenting on 01/08/2022 for Medicare Wellness ? ? ?This year started using medicare B. ?  ?DM - continues trulicity 1.5 mg weekly (recent commencement due to ozempic shortage), glipizide XL '5mg'$  daily, metformin '1000mg'$  bid. In the past week he's started really focusing on diet (low carb, no more sodas, etc).  ?Only drinking 1 coffee/day.  ?Stopped MJ 2 wks ago.  ? ?Brother had 4v bypass 08/2020 - age 61yo. Recovered well.  ? ?Knee pain - sees ortho Krasinski s/p L knee steroid injection. Discussing knee surgery.  ? ?Preventative: ?COLONOSCOPY WITH PROPOFOL 08/30/2018 - 2 TA, 1 SSP, diverticulosis, rpt 5 yrs Allen Norris, Darren, MD)  ?Prostate cancer screening - yearly screen  ?Lung cancer screening - not eligible  ?Flu shot yearly  ?COVID vaccine J&J 12/2019, booster 06/2020 ?Tdap 2011, 04/2020 ?Prevnar-13 05/2014, pneumovax 2019, 2020 (extra dose repeated at pharmacy)  ?shingrix - 04/2018, 06/2018 ?Advanced directive - Discussed - has at home but needs updating. Wants wife to be HCPOA. Asked to bring me form.  ?Seat belt use discussed ?Sunscreen use discussed. No changing moles.  ?Non smoker - wife smokes indoors. Works on [employer].  ?Alcohol - 1-2 drinks on weekends  ?Rec drugs - stopped MJ recently ?Dentist q6 mo  ?Eye exam yearly ? ?He lives with wife. 2 dogs. They have 4 grown children and 3 grandchildren  ?He works on cigarette machines, 3rd shift  ?Activity: goes to gym and swims regularly about 5d/wk  ?Diet: good water, fruits/vegetables daily  ?   ? ?Relevant past medical, surgical, family and social history reviewed and updated as indicated. Interim medical history since  our last visit reviewed. ?Allergies and medications reviewed and updated. ?Outpatient Medications Prior to Visit  ?Medication Sig Dispense Refill  ? ciclopirox (LOPROX) 0.77 % cream Apply topically 2 (two) times daily. 30 g 0  ? diclofenac Sodium (VOLTAREN) 1 % GEL Apply 4 g topically 4 (four) times daily. 500 g 5  ? Dulaglutide (TRULICITY) 1.5 JK/0.9FG SOPN Inject 1.5 mg into the skin once a week. 2 mL 6  ? ibuprofen (IBU) 800 MG tablet Take 1 tablet (800 mg total) by mouth 2 (two) times daily as needed for moderate pain. 50 tablet 1  ? Multiple Vitamins-Minerals (MULTIVITAMIN ADULTS 50+ PO) Take 1 tablet by mouth daily.    ? Omega-3 Fatty Acids (FISH OIL) 1000 MG CAPS Take 1 capsule by mouth daily.    ? omeprazole (PRILOSEC) 40 MG capsule Take 1 capsule (40 mg total) by mouth daily. For 3 weeks then as needed 30 capsule 3  ? temazepam (RESTORIL) 15 MG capsule TAKE ONE CAPSULE BY MOUTH EVERY NIGHT AT BEDTIME AS NEEDED FOR SLEEP 15 capsule 0  ? atorvastatin (LIPITOR) 20 MG tablet Take 1 tablet (20 mg total) by mouth daily. 90 tablet 0  ? Dulaglutide (TRULICITY) 1.82 XH/3.7JI SOPN Inject 0.75 mg into the skin once a week. 2 mL 0  ? glipiZIDE (GLUCOTROL XL) 5 MG 24 hr tablet Take 1 tablet (5 mg total)  by mouth daily with breakfast. 90 tablet 0  ? glucose blood (ACCU-CHEK GUIDE) test strip USE AS INSTRUCTED TO CHECK SUGARS ONCE DAILY WITH SECOND CHECK IF NEEDED 100 strip 3  ? levothyroxine (SYNTHROID) 100 MCG tablet Take 1 tablet (100 mcg total) by mouth daily with breakfast. 90 tablet 0  ? metFORMIN (GLUCOPHAGE) 1000 MG tablet Take 1 tablet (1,000 mg total) by mouth 2 (two) times daily with a meal. 180 tablet 0  ? tadalafil (CIALIS) 10 MG tablet TAKE ONE TABLET BY MOUTH EVERY OTHER DAY AS NEEDED FOR ERECTILE DYSFUNCTION 10 tablet 3  ? ?No facility-administered medications prior to visit.  ?  ? ?Per HPI unless specifically indicated in ROS section below ?Review of Systems  ?Constitutional:  Negative for activity  change, appetite change, chills, fatigue, fever and unexpected weight change.  ?HENT:  Positive for congestion and rhinorrhea. Negative for hearing loss.   ?Eyes:  Negative for visual disturbance.  ?Respiratory:  Positive for cough (allergies). Negative for chest tightness, shortness of breath and wheezing.   ?Cardiovascular:  Negative for chest pain, palpitations and leg swelling.  ?Gastrointestinal:  Positive for diarrhea (loose stools attributed to metformin). Negative for abdominal distention, abdominal pain, blood in stool, constipation, nausea and vomiting.  ?Genitourinary:  Negative for difficulty urinating and hematuria.  ?Musculoskeletal:  Negative for arthralgias, myalgias and neck pain.  ?Skin:  Negative for rash.  ?Neurological:  Negative for dizziness, seizures, syncope and headaches.  ?Hematological:  Negative for adenopathy. Does not bruise/bleed easily.  ?Psychiatric/Behavioral:  Negative for dysphoric mood. The patient is not nervous/anxious.   ? ?Objective:  ?BP 132/84   Pulse 73   Temp 98.1 ?F (36.7 ?C) (Temporal)   Ht 5' 3.75" (1.619 m)   Wt 173 lb 4 oz (78.6 kg)   SpO2 95%   BMI 29.97 kg/m?   ?Wt Readings from Last 3 Encounters:  ?01/08/22 173 lb 4 oz (78.6 kg)  ?07/29/21 182 lb 3 oz (82.6 kg)  ?06/10/21 175 lb (79.4 kg)  ?  ?  ?Physical Exam ?Vitals and nursing note reviewed.  ?Constitutional:   ?   General: He is not in acute distress. ?   Appearance: Normal appearance. He is well-developed. He is not ill-appearing.  ?HENT:  ?   Head: Normocephalic and atraumatic.  ?   Right Ear: Hearing, tympanic membrane, ear canal and external ear normal.  ?   Left Ear: Hearing, tympanic membrane, ear canal and external ear normal.  ?Eyes:  ?   General: No scleral icterus. ?   Extraocular Movements: Extraocular movements intact.  ?   Conjunctiva/sclera: Conjunctivae normal.  ?   Pupils: Pupils are equal, round, and reactive to light.  ?Neck:  ?   Thyroid: No thyroid mass or thyromegaly.  ?   Vascular:  No carotid bruit.  ?Cardiovascular:  ?   Rate and Rhythm: Normal rate and regular rhythm.  ?   Pulses: Normal pulses.     ?     Radial pulses are 2+ on the right side and 2+ on the left side.  ?   Heart sounds: Normal heart sounds. No murmur heard. ?Pulmonary:  ?   Effort: Pulmonary effort is normal. No respiratory distress.  ?   Breath sounds: Normal breath sounds. No wheezing, rhonchi or rales.  ?Abdominal:  ?   General: Bowel sounds are normal. There is no distension.  ?   Palpations: Abdomen is soft. There is no mass.  ?   Tenderness: There is  no abdominal tenderness. There is no guarding or rebound.  ?   Hernia: No hernia is present.  ?Musculoskeletal:     ?   General: Normal range of motion.  ?   Cervical back: Normal range of motion and neck supple.  ?   Right lower leg: No edema.  ?   Left lower leg: No edema.  ?Lymphadenopathy:  ?   Cervical: No cervical adenopathy.  ?Skin: ?   General: Skin is warm and dry.  ?   Findings: No rash.  ?Neurological:  ?   General: No focal deficit present.  ?   Mental Status: He is alert and oriented to person, place, and time.  ?   Comments:  ?Recall 3/3 ?Calculation 5/5 DLROW  ?Psychiatric:     ?   Mood and Affect: Mood normal.     ?   Behavior: Behavior normal.     ?   Thought Content: Thought content normal.     ?   Judgment: Judgment normal.  ? ?   ?Results for orders placed or performed in visit on 01/01/22  ?PSA, Medicare  ?Result Value Ref Range  ? PSA 0.31 0.10 - 4.00 ng/ml  ?Microalbumin / creatinine urine ratio  ?Result Value Ref Range  ? Microalb, Ur 4.1 (H) 0.0 - 1.9 mg/dL  ? Creatinine,U 218.4 mg/dL  ? Microalb Creat Ratio 1.9 0.0 - 30.0 mg/g  ?Hemoglobin A1c  ?Result Value Ref Range  ? Hgb A1c MFr Bld 9.6 (H) 4.6 - 6.5 %  ?TSH  ?Result Value Ref Range  ? TSH 2.27 0.35 - 5.50 uIU/mL  ?Lipid panel  ?Result Value Ref Range  ? Cholesterol 124 0 - 200 mg/dL  ? Triglycerides 131.0 0.0 - 149.0 mg/dL  ? HDL 41.60 >39.00 mg/dL  ? VLDL 26.2 0.0 - 40.0 mg/dL  ? LDL  Cholesterol 56 0 - 99 mg/dL  ? Total CHOL/HDL Ratio 3   ? NonHDL 82.32   ?Comprehensive metabolic panel  ?Result Value Ref Range  ? Sodium 138 135 - 145 mEq/L  ? Potassium 3.9 3.5 - 5.1 mEq/L  ? Chloride 105 96 - 112 mEq/L

## 2022-01-08 NOTE — Assessment & Plan Note (Signed)
Chronic, stable on atorvastatin '20mg'$  daily - continue. ?The ASCVD Risk score (Arnett DK, et al., 2019) failed to calculate for the following reasons: ?  The valid total cholesterol range is 130 to 320 mg/dL ?  Unable to determine if patient is Non-Hispanic African American  ?

## 2022-01-08 NOTE — Assessment & Plan Note (Signed)
Chronic, stable on levothyroxine 100mcg daily.  

## 2022-01-08 NOTE — Assessment & Plan Note (Signed)

## 2022-01-08 NOTE — Assessment & Plan Note (Signed)
cialis refilled.  ?

## 2022-01-11 ENCOUNTER — Telehealth: Payer: Self-pay

## 2022-01-11 MED ORDER — TRULICITY 1.5 MG/0.5ML ~~LOC~~ SOAJ
1.5000 mg | SUBCUTANEOUS | 0 refills | Status: DC
Start: 2022-01-11 — End: 2022-03-14

## 2022-01-11 NOTE — Telephone Encounter (Signed)
Fax received requesting refill sent to mail order.  ?

## 2022-01-14 ENCOUNTER — Encounter: Payer: Self-pay | Admitting: Family Medicine

## 2022-01-16 ENCOUNTER — Encounter: Payer: Self-pay | Admitting: Family Medicine

## 2022-01-16 NOTE — Progress Notes (Signed)
Ok to do this 

## 2022-01-17 ENCOUNTER — Other Ambulatory Visit: Payer: Self-pay

## 2022-01-17 MED ORDER — TEMAZEPAM 15 MG PO CAPS: ORAL_CAPSULE | ORAL | 0 refills | Status: DC

## 2022-01-17 NOTE — Telephone Encounter (Signed)
ERx 

## 2022-01-22 ENCOUNTER — Telehealth: Payer: Self-pay

## 2022-01-22 DIAGNOSIS — E1169 Type 2 diabetes mellitus with other specified complication: Secondary | ICD-10-CM

## 2022-01-22 MED ORDER — FREESTYLE LITE TEST VI STRP: ORAL_STRIP | 3 refills | Status: DC

## 2022-01-22 NOTE — Telephone Encounter (Signed)
E-scribed refill 

## 2022-01-24 ENCOUNTER — Encounter: Payer: Self-pay | Admitting: Family Medicine

## 2022-01-29 DIAGNOSIS — M1711 Unilateral primary osteoarthritis, right knee: Secondary | ICD-10-CM | POA: Diagnosis not present

## 2022-01-29 MED ORDER — ZOLPIDEM TARTRATE 5 MG PO TABS: 5.0000 mg | ORAL_TABLET | Freq: Every evening | ORAL | 1 refills | Status: DC | PRN

## 2022-02-14 ENCOUNTER — Ambulatory Visit (INDEPENDENT_AMBULATORY_CARE_PROVIDER_SITE_OTHER): Payer: Medicare PPO | Admitting: Family Medicine

## 2022-02-14 ENCOUNTER — Encounter: Payer: Self-pay | Admitting: Family Medicine

## 2022-02-14 VITALS — BP 120/80 | HR 74 | Temp 97.6°F | Ht 63.75 in | Wt 169.4 lb

## 2022-02-14 DIAGNOSIS — E1169 Type 2 diabetes mellitus with other specified complication: Secondary | ICD-10-CM | POA: Diagnosis not present

## 2022-02-14 DIAGNOSIS — R0789 Other chest pain: Secondary | ICD-10-CM | POA: Diagnosis not present

## 2022-02-14 DIAGNOSIS — K219 Gastro-esophageal reflux disease without esophagitis: Secondary | ICD-10-CM

## 2022-02-14 DIAGNOSIS — E039 Hypothyroidism, unspecified: Secondary | ICD-10-CM

## 2022-02-14 DIAGNOSIS — E785 Hyperlipidemia, unspecified: Secondary | ICD-10-CM | POA: Diagnosis not present

## 2022-02-14 NOTE — Assessment & Plan Note (Signed)
Story and symptoms more consistent with GERD related indigestion/chest discomfort. rec omeprazole '40mg'$  daily x3 wks with pepcid '20mg'$  nightly PRN, update if not improving with treatment.  He does have significant risk factor for cardiac disease (HLD, uncontrolled diabetes, fmhx CAD) - update EKG today. Low threshold for further evaluation if not improving with PPI as per above.

## 2022-02-14 NOTE — Assessment & Plan Note (Signed)
Chronic, he is regularly taking glipizide XL and metformin '1000mg'$  bid as well as trulicity 1.'5mg'$  weekly. 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 Consider return to ozempic in the future.  Will also check fructosamine today.  Keep DM f/u scheduled for end of 03/2022.

## 2022-02-14 NOTE — Assessment & Plan Note (Signed)
Continue atorvastatin '20mg'$ .  Lab Results  Component Value Date   LDLCALC 56 01/01/2022

## 2022-02-14 NOTE — Assessment & Plan Note (Signed)
rec daily omeprazole x3 wks + pepcid PRN as per instructions.

## 2022-02-14 NOTE — Progress Notes (Signed)
Patient ID: Kevin Sheppard., male    DOB: 08-Oct-1950, 71 y.o.   MRN: [id number]  This visit was conducted in person.  BP 120/80   Pulse 74   Temp 97.6 F (36.4 C) (Temporal)   Ht 5' 3.75" (1.619 m)   Wt 169 lb 6 oz (76.8 kg)   SpO2 94%   BMI 29.30 kg/m    CC: "indigestion" Subjective:   HPI: Kevin Sheppard. is a 71 y.o. male presenting on 02/14/2022 for Gastroesophageal Reflux (C/o indigestion in mid chest.  Recently realized he is not taking omeprazole to help with reflux.  Requests CXR. )   Over the past week notes nagging chest discomfort worse with swallowing.  Mild discomfort, at its worse 2/10.  Notes symptoms worse with coffee, steak, better with yogurt.  No chest pressure/tightness, not exertional (continues swimming 1 hour 4-5 times a week), not relieved by rest. No dyspnea, cough or wheezing, dizziness or palpitations.   GERD - he's been taking omeprazole PRN. Notes some indigestion as well.  Chronic bradycardia previously attributed to healthy heart.   DM - worsened control on last check - despite trulicity 9.3TT weekly in addition to his metformin 1026m bid, glipizide XL 565m  Feels trulicity may be causing worsening insomnia, he'd like to prefer to return to ozempic.   Continues lipitor 2041maily for HLD in diabetes.  Strong fmhx CAD.  Told may need knee replacement.  Continues caring for MIL.  Lab Results  Component Value Date   TSH 2.27 01/01/2022        Relevant past medical, surgical, family and social history reviewed and updated as indicated. Interim medical history since our last visit reviewed. Allergies and medications reviewed and updated. Outpatient Medications Prior to Visit  Medication Sig Dispense Refill   atorvastatin (LIPITOR) 20 MG tablet Take 1 tablet (20 mg total) by mouth daily. 90 tablet 3   Blood Glucose Monitoring Suppl (FREESTYLE LITE) w/Device KIT Use to check sugars daily and as needed E11.69 1 kit 0   ciclopirox (LOPROX) 0.77 %  cream Apply topically 2 (two) times daily. 30 g 0   diclofenac Sodium (VOLTAREN) 1 % GEL Apply 4 g topically 4 (four) times daily. 500 g 5   Dulaglutide (TRULICITY) 1.5 MG/SV/7.7LTPN Inject 1.5 mg into the skin once a week. 6 mL 0   glipiZIDE (GLUCOTROL XL) 5 MG 24 hr tablet Take 1 tablet (5 mg total) by mouth daily with breakfast. 90 tablet 3   glucose blood (FREESTYLE LITE) test strip Use as instructed to check sugars daily 100 each 3   ibuprofen (IBU) 800 MG tablet Take 1 tablet (800 mg total) by mouth 2 (two) times daily as needed for moderate pain. 50 tablet 1   levothyroxine (SYNTHROID) 100 MCG tablet Take 1 tablet (100 mcg total) by mouth daily with breakfast. 90 tablet 3   metFORMIN (GLUCOPHAGE) 1000 MG tablet Take 1 tablet (1,000 mg total) by mouth 2 (two) times daily with a meal. 180 tablet 3   Multiple Vitamins-Minerals (MULTIVITAMIN ADULTS 50+ PO) Take 1 tablet by mouth daily.     Omega-3 Fatty Acids (FISH OIL) 1000 MG CAPS Take 1 capsule by mouth daily.     omeprazole (PRILOSEC) 40 MG capsule Take 1 capsule (40 mg total) by mouth daily. For 3 weeks then as needed 30 capsule 3   tadalafil (CIALIS) 10 MG tablet TAKE ONE TABLET BY MOUTH EVERY OTHER DAY AS NEEDED FOR ERECTILE DYSFUNCTION 10  tablet 6   temazepam (RESTORIL) 15 MG capsule TAKE ONE CAPSULE BY MOUTH EVERY NIGHT AT BEDTIME AS NEEDED FOR SLEEP 15 capsule 0   zolpidem (AMBIEN) 5 MG tablet Take 1 tablet (5 mg total) by mouth at bedtime as needed for sleep. 15 tablet 1   No facility-administered medications prior to visit.     Per HPI unless specifically indicated in ROS section below Review of Systems  Objective:  BP 120/80   Pulse 74   Temp 97.6 F (36.4 C) (Temporal)   Ht 5' 3.75" (1.619 m)   Wt 169 lb 6 oz (76.8 kg)   SpO2 94%   BMI 29.30 kg/m   Wt Readings from Last 3 Encounters:  02/14/22 169 lb 6 oz (76.8 kg)  01/08/22 173 lb 4 oz (78.6 kg)  07/29/21 182 lb 3 oz (82.6 kg)      Physical Exam Vitals and  nursing note reviewed.  Constitutional:      Appearance: Normal appearance. He is not ill-appearing.  Cardiovascular:     Rate and Rhythm: Normal rate and regular rhythm.     Pulses: Normal pulses.     Heart sounds: Normal heart sounds. No murmur heard. Pulmonary:     Effort: Pulmonary effort is normal. No respiratory distress.     Breath sounds: Normal breath sounds. No wheezing, rhonchi or rales.  Abdominal:     General: Abdomen is flat. Bowel sounds are normal. There is no distension.     Palpations: Abdomen is soft. There is no mass.     Tenderness: There is no abdominal tenderness. There is no right CVA tenderness, left CVA tenderness, guarding or rebound.     Hernia: No hernia is present.  Musculoskeletal:     Right lower leg: No edema.     Left lower leg: No edema.  Skin:    General: Skin is warm and dry.  Neurological:     Mental Status: He is alert.  Psychiatric:        Mood and Affect: Mood normal.        Behavior: Behavior normal.      Results for orders placed or performed in visit on 01/14/22  HM DIABETES EYE EXAM  Result Value Ref Range   HM Diabetic Eye Exam No Retinopathy No Retinopathy   Lab Results  Component Value Date   HGBA1C 9.6 (H) 01/01/2022   EKG - NSR rate 60s, LAD with LAFB, normal intervals, no hypertrophy or acute ST/T changes, poor R wave progression.   Assessment & Plan:   Problem List Items Addressed This Visit     Hypothyroidism   Type 2 diabetes mellitus with other specified complication ([place])    Chronic, he is regularly taking glipizide XL and metformin 109m bid as well as trulicity 19.5MWweekly. He feels he tolerated ozempic better however this was switched due to nProducer, television/film/video Consider return to ozempic in the future.  Will also check fructosamine today.  Keep DM f/u scheduled for end of 03/2022.        Relevant Orders   Fructosamine   Hyperlipidemia associated with type 2 diabetes mellitus (HCC)    Continue atorvastatin  240m  Lab Results  Component Value Date   LDLCALC 56 01/01/2022        GERD (gastroesophageal reflux disease)    rec daily omeprazole x3 wks + pepcid PRN as per instructions.        Chest discomfort - Primary    Story and symptoms  more consistent with GERD related indigestion/chest discomfort. rec omeprazole 45m daily x3 wks with pepcid 260mnightly PRN, update if not improving with treatment.  He does have significant risk factor for cardiac disease (HLD, uncontrolled diabetes, fmhx CAD) - update EKG today. Low threshold for further evaluation if not improving with PPI as per above.        Relevant Orders   EKG 12-Lead (Completed)     No orders of the defined types were placed in this encounter.  Orders Placed This Encounter  Procedures   Fructosamine   EKG 12-Lead     Patient Instructions  Lab today (fructosamine) EKG today  I think symptoms are more indigestion/reflux related. Try omeprazole 4037maily for 3 weeks then as needed. May also take pepcid 79m23mghtly - this is over the counter.  Keep follow up visit.   Reflux precautions: Head of bed elevated. Avoidance of citrus, fatty foods, chocolate, peppermint, and excessive alcohol, along with sodas, orange juice (acidic drinks) At least a few hours between dinner and bed, minimize naps after eating. No smoking.   Follow up plan: No follow-ups on file.  JaviRia Bush

## 2022-02-14 NOTE — Patient Instructions (Addendum)
Lab today (fructosamine) EKG today  I think symptoms are more indigestion/reflux related. Try omeprazole '40mg'$  daily for 3 weeks then as needed. May also take pepcid '20mg'$  nightly - this is over the counter.  Keep follow up visit.   Reflux precautions: Head of bed elevated. Avoidance of citrus, fatty foods, chocolate, peppermint, and excessive alcohol, along with sodas, orange juice (acidic drinks) At least a few hours between dinner and bed, minimize naps after eating. No smoking.

## 2022-02-18 LAB — FRUCTOSAMINE: Fructosamine: 322 umol/L — ABNORMAL HIGH (ref 205–285)

## 2022-02-25 ENCOUNTER — Encounter: Payer: Self-pay | Admitting: Internal Medicine

## 2022-02-25 ENCOUNTER — Ambulatory Visit (INDEPENDENT_AMBULATORY_CARE_PROVIDER_SITE_OTHER): Payer: Medicare PPO | Admitting: Internal Medicine

## 2022-02-25 DIAGNOSIS — M25511 Pain in right shoulder: Secondary | ICD-10-CM

## 2022-02-25 MED ORDER — HYDROCODONE-ACETAMINOPHEN 5-325 MG PO TABS: 1.0000 | ORAL_TABLET | Freq: Four times a day (QID) | ORAL | 0 refills | Status: DC | PRN

## 2022-02-25 NOTE — Assessment & Plan Note (Signed)
No internal derangement noted---fair active ROM (so no tears) Likely from having to work over his head with heavy trimmer (after 5 straight days of swimming) Discussed tendonitis Try ice Diclofenac didn't help Has some meloxicam---helped a bit (tries to limit) Will give a few hydrocodone for severe pain

## 2022-02-25 NOTE — Progress Notes (Signed)
Subjective:    Patient ID: Kevin Sheppard., male    DOB: 03-31-1951, 71 y.o.   MRN: [id number]  HPI Here due to shoulder pain  Swims a lot---will get some pain in proximal arms/shoulders. Can swim for an hour a day. Usually no more than 3 days a week Formerly runner--but stopped due to knee problems Had missed a week due to pool being closed--then went back and swam 5 days in a row Started with some mild pain  3 days ago--was pruning branches over shed and things got worse Couldn't sleep that night due to pain Did try diclofenac gel--not much help Did use left over med from MIL---vicodin. This helped No swelling  Current Outpatient Medications on File Prior to Visit  Medication Sig Dispense Refill   atorvastatin (LIPITOR) 20 MG tablet Take 1 tablet (20 mg total) by mouth daily. 90 tablet 3   Blood Glucose Monitoring Suppl (FREESTYLE LITE) w/Device KIT Use to check sugars daily and as needed E11.69 1 kit 0   ciclopirox (LOPROX) 0.77 % cream Apply topically 2 (two) times daily. 30 g 0   diclofenac Sodium (VOLTAREN) 1 % GEL Apply 4 g topically 4 (four) times daily. 500 g 5   Dulaglutide (TRULICITY) 1.5 OB/0.9GG SOPN Inject 1.5 mg into the skin once a week. 6 mL 0   glipiZIDE (GLUCOTROL XL) 5 MG 24 hr tablet Take 1 tablet (5 mg total) by mouth daily with breakfast. 90 tablet 3   glucose blood (FREESTYLE LITE) test strip Use as instructed to check sugars daily 100 each 3   ibuprofen (IBU) 800 MG tablet Take 1 tablet (800 mg total) by mouth 2 (two) times daily as needed for moderate pain. 50 tablet 1   levothyroxine (SYNTHROID) 100 MCG tablet Take 1 tablet (100 mcg total) by mouth daily with breakfast. 90 tablet 3   MELOXICAM PO Take by mouth.     metFORMIN (GLUCOPHAGE) 1000 MG tablet Take 1 tablet (1,000 mg total) by mouth 2 (two) times daily with a meal. 180 tablet 3   Multiple Vitamins-Minerals (MULTIVITAMIN ADULTS 50+ PO) Take 1 tablet by mouth daily.     omeprazole (PRILOSEC) 40 MG  capsule Take 1 capsule (40 mg total) by mouth daily. For 3 weeks then as needed 30 capsule 3   tadalafil (CIALIS) 10 MG tablet TAKE ONE TABLET BY MOUTH EVERY OTHER DAY AS NEEDED FOR ERECTILE DYSFUNCTION 10 tablet 6   temazepam (RESTORIL) 15 MG capsule TAKE ONE CAPSULE BY MOUTH EVERY NIGHT AT BEDTIME AS NEEDED FOR SLEEP 15 capsule 0   zolpidem (AMBIEN) 5 MG tablet Take 1 tablet (5 mg total) by mouth at bedtime as needed for sleep. 15 tablet 1   No current facility-administered medications on file prior to visit.    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/07/2013   Thyroid related neuropathy   Undescended left testicle 1961   after trauma with dodgeball    Past Surgical History:  Procedure Laterality Date   COLONOSCOPY  2004;2009   first with b9 polyps, then diverticulosis no polyps, rpt  12yr ([facility]   COLONOSCOPY WITH PROPOFOL N/A 08/30/2018   2 TA, 1 SSP, diverticulosis, rpt 5 yrs ([facility] Darren, MD)   MENISCUS REPAIR Bilateral [id number]  POLYPECTOMY N/A 08/30/2018   Procedure: POLYPECTOMY;  Surgeon: WLucilla Lame MD;  [place] [facility]  Service: Endoscopy;  Laterality: N/A;    Family History  Problem Relation Age of Onset   Stroke Father    Diabetes Father    Other Mother        blood disease, died 35(?porphyria)   Diabetes type II Brother    Diabetes type II Maternal Aunt    Hypertension Sister    Stroke Sister 515  Thyroid disease Sister    Cancer Neg Hx    CAD Neg Hx     Social History   Socioeconomic History   Marital status: Married    Spouse name: Not on file   Number of children: 4   Years of education: Not on  file   Highest education level: Not on file  Occupational History   Occupation: lAdministrator, Civil Service Tobacco Use   Smoking status: Never   Smokeless tobacco: Never  Vaping Use   Vaping Use: Never used  Substance and Sexual Activity   Alcohol use: Yes    Alcohol/week: 6.0 standard drinks of alcohol    Types: 6 Cans of beer per week    Comment:     Drug use: No   Sexual activity: Not on file  Other Topics Concern   Not on file  Social History Narrative   He lives with wife.  2 dogs. They have grown 4 children and 3 grandchildren.    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  Musculoskeletal:     Comments: Normal abduction in both shoulders Good internal rotation bilaterally---mild restriction in external rotation on left Mild tenderness around lateral shoulder (tendon)  Neurological:     Mental Status: He is alert.            Assessment & Plan:

## 2022-02-28 ENCOUNTER — Other Ambulatory Visit: Payer: Self-pay | Admitting: Family Medicine

## 2022-02-28 NOTE — Telephone Encounter (Signed)
Patient called back in, states that [facility] requires an okay from the doctor for each refill due to insurance purposes.

## 2022-02-28 NOTE — Telephone Encounter (Signed)
Left message on voicemail for patient to call the office back. These refills were sent to Kevin Sheppard 01/08/22 with a years refills. Need to know if patient requested these from the mail order pharmacy.

## 2022-03-04 ENCOUNTER — Other Ambulatory Visit: Payer: Self-pay | Admitting: Family Medicine

## 2022-03-04 NOTE — Telephone Encounter (Signed)
Name of Medication: Temazepam  Name of Pharmacy: [facility] Last Fill or Written Date and Quantity: 01/17/22, #15 Last Office Visit and Type: 02/14/22, chest pain Next Office Visit and Type: 04/09/22, 3 mo DM f/u Last Controlled Substance Agreement Date: none Last UDS: none

## 2022-03-05 NOTE — Telephone Encounter (Signed)
ERx 

## 2022-03-10 ENCOUNTER — Encounter: Payer: Self-pay | Admitting: Family Medicine

## 2022-03-10 DIAGNOSIS — E1169 Type 2 diabetes mellitus with other specified complication: Secondary | ICD-10-CM

## 2022-03-12 ENCOUNTER — Other Ambulatory Visit: Payer: Self-pay | Admitting: Family Medicine

## 2022-03-14 ENCOUNTER — Encounter: Payer: Self-pay | Admitting: Family Medicine

## 2022-03-14 MED ORDER — OZEMPIC (0.25 OR 0.5 MG/DOSE) 2 MG/1.5ML ~~LOC~~ SOPN: 0.5000 mg | PEN_INJECTOR | SUBCUTANEOUS | 3 refills | Status: DC

## 2022-03-14 MED ORDER — MELOXICAM 15 MG PO TABS: 15.0000 mg | ORAL_TABLET | Freq: Every day | ORAL | 0 refills | Status: DC

## 2022-03-14 NOTE — Telephone Encounter (Signed)
Patient is calling regards to this and asking for a call back.

## 2022-03-14 NOTE — Telephone Encounter (Signed)
MEDICATION:MELOXICAM PO  PHARMACY: [id number] - [place], [address] AT [address]  Comments: Patient has one pill left   **Let patient know to contact pharmacy at the end of the day to make sure medication is ready. **  ** Please notify patient to allow 48-72 hours to process**  **Encourage patient to contact the pharmacy for refills or they can request refills through [facility]**

## 2022-03-14 NOTE — Telephone Encounter (Signed)
ERx 

## 2022-03-19 ENCOUNTER — Telehealth: Payer: Self-pay

## 2022-03-19 DIAGNOSIS — K219 Gastro-esophageal reflux disease without esophagitis: Secondary | ICD-10-CM

## 2022-03-19 MED ORDER — OMEPRAZOLE 40 MG PO CPDR: 40.0000 mg | DELAYED_RELEASE_CAPSULE | Freq: Every day | ORAL | 0 refills | Status: DC | PRN

## 2022-03-19 MED ORDER — OMEPRAZOLE 40 MG PO CPDR: 40.0000 mg | DELAYED_RELEASE_CAPSULE | Freq: Every day | ORAL | 0 refills | Status: DC

## 2022-03-19 NOTE — Telephone Encounter (Signed)
E-scribed rx 

## 2022-03-19 NOTE — Telephone Encounter (Signed)
Filled and returned to Kate.  

## 2022-03-19 NOTE — Telephone Encounter (Signed)
Faxed prior auth paperwork for Ozempic to f # [phone].  Waiting for determination.

## 2022-03-19 NOTE — Telephone Encounter (Signed)
We received a fax for a prior auth for Ozempic 0.25 or 0.5.  I have filled out the form and placed it in your inbox for signing.  Please return to me so I can fax it in.

## 2022-03-20 ENCOUNTER — Other Ambulatory Visit: Payer: Self-pay | Admitting: Family Medicine

## 2022-03-20 NOTE — Telephone Encounter (Signed)
Prior auth for [employer] has been approved.    At this time, the authorization for coverage is effective until 09/14/2098; however, the coverage end-date may change based on future review of the drug and drug class by the [place].  Approval letter sent to scanning.

## 2022-03-25 ENCOUNTER — Telehealth: Payer: Self-pay | Admitting: Family Medicine

## 2022-03-25 ENCOUNTER — Other Ambulatory Visit: Payer: Self-pay | Admitting: Family Medicine

## 2022-03-25 NOTE — Telephone Encounter (Signed)
Caller Name: Mer[facility] Lot from [facility] Call back phone #: [phone], Reference: [id number]  MEDICATION(S): Meloxicam   Days of Med Remaining: Unsure  Has the patient contacted their pharmacy (YES/NO)?  Pharmacy is who called IF YES, when and what did the pharmacy advise?  IF NO, request that the patient contact the pharmacy for the refills in the future.             The pharmacy will send an electronic request (except for controlled medications).  [facility]  ~~~Please advise patient/caregiver to allow 2-3 business days to process RX refills.

## 2022-03-25 NOTE — Telephone Encounter (Signed)
error 

## 2022-03-26 NOTE — Telephone Encounter (Signed)
Spoke to [place] at [facility] and was advised that the patient is requesting that they get a new script from his doctor for a 90 day supply of Meloxicam. Last office visit 02/25/22 acute Upcoming 04/09/22 Last refill 03/14/22 #30  [facility]

## 2022-03-28 MED ORDER — MELOXICAM 15 MG PO TABS
15.0000 mg | ORAL_TABLET | Freq: Every day | ORAL | 0 refills | Status: DC
Start: 2022-03-28 — End: 2022-11-07

## 2022-03-28 NOTE — Telephone Encounter (Signed)
ERx 

## 2022-04-09 ENCOUNTER — Ambulatory Visit (INDEPENDENT_AMBULATORY_CARE_PROVIDER_SITE_OTHER): Payer: TRICARE For Life (TFL) | Admitting: Family Medicine

## 2022-04-09 ENCOUNTER — Encounter: Payer: Self-pay | Admitting: Family Medicine

## 2022-04-09 VITALS — BP 120/84 | HR 75 | Temp 97.3°F | Ht 63.75 in | Wt 174.4 lb

## 2022-04-09 DIAGNOSIS — G4726 Circadian rhythm sleep disorder, shift work type: Secondary | ICD-10-CM

## 2022-04-09 DIAGNOSIS — E1169 Type 2 diabetes mellitus with other specified complication: Secondary | ICD-10-CM

## 2022-04-09 DIAGNOSIS — B351 Tinea unguium: Secondary | ICD-10-CM | POA: Diagnosis not present

## 2022-04-09 LAB — POCT GLYCOSYLATED HEMOGLOBIN (HGB A1C): Hemoglobin A1C: 9.5 % — AB (ref 4.0–5.6)

## 2022-04-09 MED ORDER — ZOLPIDEM TARTRATE ER 6.25 MG PO TBCR: 6.2500 mg | EXTENDED_RELEASE_TABLET | Freq: Every evening | ORAL | 0 refills | Status: DC | PRN

## 2022-04-09 NOTE — Progress Notes (Signed)
.   Patient ID: Dora Clauss., male    DOB: [birth year], 71 y.o.   MRN: [id number]  This visit was conducted in person.  BP 120/84   Pulse 75   Temp (!) 97.3 F (36.3 C) (Temporal)   Ht 5' 3.75" (1.619 m)   Wt 174 lb 6 oz (79.1 kg)   SpO2 96%   BMI 30.17 kg/m    CC: DM f/u visit  Subjective:   HPI: Floy Riegler. is a 71 y.o. male presenting on 04/09/2022 for Diabetes (Here for 3 mo f/u.  Also, pt wants to discuss Ambien, temazepam and increasing number of tablets for tadalafil.  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 Temazepam 85m was not as effective, 372mwas more effective.   DM - does regularly check sugars but not in the past week. Compliant with antihyperglycemic regimen which includes: metformin 100090mid and glipizide XL 5mg63mily. Now on ozempic 0.5mg 73mkly started 2 wks ago, feels more effective than trulicity. Notes some dietary liberties this month. Denies low sugars or hypoglycemic symptoms. Denies paresthesias, blurry vision. Last diabetic eye exam 10/2021. Glucometer brand: freestyle lite. Last foot exam: 05/2020 - DUE. DSME: Completed 2011. Lab Results  Component Value Date   HGBA1C 9.5 (A) 04/09/2022   Diabetic Foot Exam - Simple   Simple Foot Form Diabetic Foot exam was performed with the following findings: Yes 04/09/2022  4:20 PM  Visual Inspection See comments: Yes Sensation Testing Intact to touch and monofilament testing bilaterally: Yes Pulse Check Posterior Tibialis and Dorsalis pulse intact bilaterally: Yes Comments Maceration between L 4/5th toes Thickened onychomycotic R great toenail with onycholysis    Lab Results  Component Value Date   MICROALBUR 4.1 (H) 01/01/2022         Relevant past medical, surgical, family and social history reviewed and updated as indicated. Interim medical history since our last visit reviewed. Allergies and medications reviewed and  updated. Outpatient Medications Prior to Visit  Medication Sig Dispense Refill   atorvastatin (LIPITOR) 20 MG tablet TAKE 1 TABLET DAILY 90 tablet 3   Blood Glucose Monitoring Suppl (FREESTYLE LITE) w/Device KIT Use to check sugars daily and as needed E11.69 1 kit 0   ciclopirox (LOPROX) 0.77 % cream Apply topically 2 (two) times daily. 30 g 0   diclofenac Sodium (VOLTAREN) 1 % GEL Apply 4 g topically 4 (four) times daily. 500 g 5   glipiZIDE (GLUCOTROL XL) 5 MG 24 hr tablet Take 1 tablet (5 mg total) by mouth daily with breakfast. 90 tablet 3   glucose blood (FREESTYLE LITE) test strip Use as instructed to check sugars daily 100 each 3   HYDROcodone-acetaminophen (NORCO/VICODIN) 5-325 MG tablet Take 1 tablet by mouth every 6 (six) hours as needed for moderate pain. 15 tablet 0   ibuprofen (IBU) 800 MG tablet Take 1 tablet (800 mg total) by mouth 2 (two) times daily as needed for moderate pain. 50 tablet 1   levothyroxine (SYNTHROID) 100 MCG tablet Take 1 tablet (100 mcg total) by mouth daily with breakfast. 90 tablet 3   meloxicam (MOBIC) 15 MG tablet Take 1 tablet (15 mg total) by mouth daily. 90 tablet 0   metFORMIN (GLUCOPHAGE) 1000 MG tablet TAKE 1 TABLET TWICE A DAY WITH MEALS 180 tablet 3   Multiple Vitamins-Minerals (MULTIVITAMIN ADULTS 50+ PO) Take 1 tablet by mouth daily.     omeprazole (PRILOSEC)  40 MG capsule Take 1 capsule (40 mg total) by mouth daily as needed. 90 capsule 0   Semaglutide,0.25 or 0.5MG/DOS, (OZEMPIC, 0.25 OR 0.5 MG/DOSE,) 2 MG/1.5ML SOPN Inject 0.5 mg into the skin once a week. 4.5 mL 3   tadalafil (CIALIS) 10 MG tablet TAKE ONE TABLET BY MOUTH EVERY OTHER DAY AS NEEDED FOR ERECTILE DYSFUNCTION 10 tablet 6   temazepam (RESTORIL) 15 MG capsule TAKE 1 CAPSULE BY MOUTH EVERY NIGHT AT BEDTIME AS NEEDED FOR SLEEP 15 capsule 0   zolpidem (AMBIEN) 5 MG tablet Take 1 tablet (5 mg total) by mouth at bedtime as needed for sleep. 15 tablet 1   No facility-administered  medications prior to visit.     Per HPI unless specifically indicated in ROS section below Review of Systems  Objective:  BP 120/84   Pulse 75   Temp (!) 97.3 F (36.3 C) (Temporal)   Ht 5' 3.75" (1.619 m)   Wt 174 lb 6 oz (79.1 kg)   SpO2 96%   BMI 30.17 kg/m   Wt Readings from Last 3 Encounters:  04/09/22 174 lb 6 oz (79.1 kg)  02/25/22 173 lb (78.5 kg)  02/14/22 169 lb 6 oz (76.8 kg)      Physical Exam Vitals and nursing note reviewed.  Constitutional:      Appearance: Normal appearance. He is not ill-appearing.  Eyes:     Extraocular Movements: Extraocular movements intact.     Conjunctiva/sclera: Conjunctivae normal.     Pupils: Pupils are equal, round, and reactive to light.  Cardiovascular:     Rate and Rhythm: Normal rate and regular rhythm.     Pulses: Normal pulses.     Heart sounds: Normal heart sounds. No murmur heard. Pulmonary:     Effort: Pulmonary effort is normal. No respiratory distress.     Breath sounds: Normal breath sounds. No wheezing, rhonchi or rales.  Musculoskeletal:     Right lower leg: No edema.     Left lower leg: No edema.     Comments: See HPI for foot exam if done  Skin:    General: Skin is warm and dry.     Findings: No rash.  Neurological:     Mental Status: He is alert.  Psychiatric:        Mood and Affect: Mood normal.        Behavior: Behavior normal.       Results for orders placed or performed in visit on 04/09/22  POCT glycosylated hemoglobin (Hb A1C)  Result Value Ref Range   Hemoglobin A1C 9.5 (A) 4.0 - 5.6 %   HbA1c POC (<> result, manual entry)     HbA1c, POC (prediabetic range)     HbA1c, POC (controlled diabetic range)      Assessment & Plan:   Problem List Items Addressed This Visit     Type 2 diabetes mellitus with other specified complication ([place]) - Primary    Chronic, remains uncontrolled despite Ozempic 0.5 mg weekly- he did only start this medication 2 weeks ago. Encouraged renewed efforts towards  low sugar low-carb diabetic diet, continue metformin and glipizide XL, give Ozempic more time to work. Return to clinic 3 months diabetes follow-up.      Relevant Orders   POCT glycosylated hemoglobin (Hb A1C) (Completed)   Shift work sleep disorder    Ongoing, longstanding.  Continues third shift. Finds temazepam 15 mg not as effective as he would like.  Feels better with 30 mg  temazepam.  Feels Ambien actually works the best, 5 mg dose allows him about 4 hours of sleep.  However caution in history of Ambien overuse.  Will trial Ambien CR 6.25 mg nightly as needed daily, discussed using only as needed, update with effect.      Onychomycosis of great toe    Evident onychomycosis on right great toe nail as well as maceration between fourth and fifth toes of the left foot.  Discussed Lotrimin use, foot care measures        Meds ordered this encounter  Medications   zolpidem (AMBIEN CR) 6.25 MG CR tablet    Sig: Take 1 tablet (6.25 mg total) by mouth at bedtime as needed for sleep.    Dispense:  20 tablet    Refill:  0   Orders Placed This Encounter  Procedures   POCT glycosylated hemoglobin (Hb A1C)     Patient Instructions  Sugars are too high - work on low sugars low carbs.  Let me known when running out of ozempic 0.60m to increase dose to 138mweekly.  Lotrimin between toes of left foot  Try ambien CR 6.2597ms needed at night time, watch for parasomnias (sleep walking, talking, eating).  Return in 3 months for diabetes   Follow up plan: Return in about 3 months (around 07/10/2022) for follow up visit.  JavRia BushD

## 2022-04-09 NOTE — Assessment & Plan Note (Signed)
Chronic, remains uncontrolled despite Ozempic 0.5 mg weekly- he did only start this medication 2 weeks ago. Encouraged renewed efforts towards low sugar low-carb diabetic diet, continue metformin and glipizide XL, give Ozempic more time to work. Return to clinic 3 months diabetes follow-up.

## 2022-04-09 NOTE — Assessment & Plan Note (Signed)
Evident onychomycosis on right great toe nail as well as maceration between fourth and fifth toes of the left foot.  Discussed Lotrimin use, foot care measures

## 2022-04-09 NOTE — Assessment & Plan Note (Signed)
Ongoing, longstanding.  Continues third shift. Finds temazepam 15 mg not as effective as he would like.  Feels better with 30 mg temazepam.  Feels Ambien actually works the best, 5 mg dose allows him about 4 hours of sleep.  However caution in history of Ambien overuse.  Will trial Ambien CR 6.25 mg nightly as needed daily, discussed using only as needed, update with effect.

## 2022-04-09 NOTE — Patient Instructions (Signed)
Sugars are too high - work on low sugars low carbs.  Let me known when running out of ozempic 0.'5mg'$  to increase dose to '1mg'$  weekly.  Lotrimin between toes of left foot  Try ambien CR 6.'25mg'$  as needed at night time, watch for parasomnias (sleep walking, talking, eating).  Return in 3 months for diabetes

## 2022-04-22 MED ORDER — LEVOTHYROXINE SODIUM 100 MCG PO TABS: 100.0000 ug | ORAL_TABLET | Freq: Every day | ORAL | 3 refills | Status: DC

## 2022-04-22 MED ORDER — GLIPIZIDE ER 5 MG PO TB24: 5.0000 mg | ORAL_TABLET | Freq: Every day | ORAL | 3 refills | Status: DC

## 2022-04-22 NOTE — Addendum Note (Signed)
Addended by: Kris Mouton on: 04/22/2022 08:21 AM   Modules accepted: Orders

## 2022-05-07 ENCOUNTER — Telehealth: Payer: Self-pay

## 2022-05-07 NOTE — Telephone Encounter (Signed)
Spoke with [place] of [employer] to [employer] about PA not to expire until 09/14/2098. Says their records show the same thing and pt is still receiving shipments and has 3 refills on last rx.

## 2022-05-07 NOTE — Telephone Encounter (Signed)
I attempted a new prior auth for Ozempic (0.25 or 0.5 MG/DOSE) '2MG'$ /3ML pen-injectors.  I received a fax from [facility] that we needed a new PA. Oliverio Cho (Key: [id number])  Per CMM:   Message from Plan An active PA is already on file with expiration date of 09/14/2098. Please wait to resubmit request within 60 days of that expiration date to obtain a PA renewal.

## 2022-06-26 ENCOUNTER — Other Ambulatory Visit: Payer: Self-pay | Admitting: Family Medicine

## 2022-06-26 NOTE — Telephone Encounter (Signed)
Name of Medication: Ambien Name of Pharmacy: [facility] [address] Last Fill or Written Date and Quantity: 04/09/22, #20 Last Office Visit and Type: 04/09/22, 3 mo DM f/u Next Office Visit and Type: 07/11/22, 3 mo DM f/u Last Controlled Substance Agreement Date: none Last UDS: none

## 2022-06-30 NOTE — Telephone Encounter (Signed)
ERx 

## 2022-07-11 ENCOUNTER — Ambulatory Visit: Payer: TRICARE For Life (TFL) | Admitting: Family Medicine

## 2022-07-30 ENCOUNTER — Encounter: Payer: Self-pay | Admitting: Family Medicine

## 2022-08-01 DIAGNOSIS — M170 Bilateral primary osteoarthritis of knee: Secondary | ICD-10-CM | POA: Diagnosis not present

## 2022-08-01 DIAGNOSIS — M1712 Unilateral primary osteoarthritis, left knee: Secondary | ICD-10-CM | POA: Diagnosis not present

## 2022-08-01 DIAGNOSIS — M1711 Unilateral primary osteoarthritis, right knee: Secondary | ICD-10-CM | POA: Diagnosis not present

## 2022-08-14 ENCOUNTER — Other Ambulatory Visit: Payer: Self-pay | Admitting: Family Medicine

## 2022-08-15 NOTE — Telephone Encounter (Signed)
Name of Medication: Ambien Name of Pharmacy: [facility] [address] Last Fill or Written Date and Quantity: 04/09/22, #20 Last Office Visit and Type: 04/09/22, 3 mo DM f/u Next Office Visit and Type: 08/27/22, pre-op eval Last Controlled Substance Agreement Date: none Last UDS: none

## 2022-08-17 NOTE — Telephone Encounter (Signed)
ERx 

## 2022-08-27 ENCOUNTER — Ambulatory Visit: Payer: TRICARE For Life (TFL) | Admitting: Family Medicine

## 2022-09-02 ENCOUNTER — Ambulatory Visit (INDEPENDENT_AMBULATORY_CARE_PROVIDER_SITE_OTHER): Payer: Medicare PPO | Admitting: Family Medicine

## 2022-09-02 ENCOUNTER — Encounter: Payer: Self-pay | Admitting: Family Medicine

## 2022-09-02 ENCOUNTER — Ambulatory Visit (INDEPENDENT_AMBULATORY_CARE_PROVIDER_SITE_OTHER)
Admission: RE | Admit: 2022-09-02 | Discharge: 2022-09-02 | Disposition: A | Discharge status: Home / Self Care | Payer: Medicare PPO | Source: Ambulatory Visit | Attending: Family Medicine | Admitting: Family Medicine

## 2022-09-02 VITALS — BP 134/82 | HR 97 | Temp 97.8°F | Ht 63.75 in | Wt 173.0 lb

## 2022-09-02 DIAGNOSIS — K219 Gastro-esophageal reflux disease without esophagitis: Secondary | ICD-10-CM | POA: Diagnosis not present

## 2022-09-02 DIAGNOSIS — E1169 Type 2 diabetes mellitus with other specified complication: Secondary | ICD-10-CM | POA: Diagnosis not present

## 2022-09-02 DIAGNOSIS — I444 Left anterior fascicular block: Secondary | ICD-10-CM | POA: Diagnosis not present

## 2022-09-02 DIAGNOSIS — E663 Overweight: Secondary | ICD-10-CM

## 2022-09-02 DIAGNOSIS — E039 Hypothyroidism, unspecified: Secondary | ICD-10-CM | POA: Diagnosis not present

## 2022-09-02 DIAGNOSIS — Z01818 Encounter for other preprocedural examination: Secondary | ICD-10-CM | POA: Diagnosis not present

## 2022-09-02 LAB — POCT GLYCOSYLATED HEMOGLOBIN (HGB A1C): Hemoglobin A1C: 9.2 % — AB (ref 4.0–5.6)

## 2022-09-02 MED ORDER — FREESTYLE LITE W/DEVICE KIT: PACK | 0 refills | Status: DC
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
## 2022-09-02 NOTE — Patient Instructions (Addendum)
Labs today EKG and chest xray today.  We will send today's information to the orthopedic surgeon's office.  A1c was too high at 9.2% - increase ozempic to 1 mg weekly (new dose sent to pharmacy). Schedule follow up visit in 5-6 weeks for diabetes follow up visit. Work on low sugar low carb diabetic diet

## 2022-09-03 LAB — COMPREHENSIVE METABOLIC PANEL
ALT: 38 U/L (ref 0–53)
AST: 38 U/L — ABNORMAL HIGH (ref 0–37)
Albumin: 4.9 g/dL (ref 3.5–5.2)
Alkaline Phosphatase: 94 U/L (ref 39–117)
BUN: 16 mg/dL (ref 6–23)
CO2: 23 mEq/L (ref 19–32)
Calcium: 10.4 mg/dL (ref 8.4–10.5)
Chloride: 106 mEq/L (ref 96–112)
Creatinine, Ser: 0.8 mg/dL (ref 0.40–1.50)
GFR: 89.82 mL/min (ref >60.00)
Glucose, Bld: 165 mg/dL — ABNORMAL HIGH (ref 70–99)
Potassium: 4.1 mEq/L (ref 3.5–5.1)
Sodium: 142 mEq/L (ref 135–145)
Total Bilirubin: 0.4 mg/dL (ref 0.2–1.2)
Total Protein: 7.2 g/dL (ref 6.0–8.3)

## 2022-09-03 LAB — CBC WITH DIFFERENTIAL/PLATELET
Basophils Absolute: 0.1 10*3/uL (ref 0.0–0.1)
Basophils Relative: 1 % (ref 0.0–3.0)
Eosinophils Absolute: 0.1 10*3/uL (ref 0.0–0.7)
Eosinophils Relative: 0.9 % (ref 0.0–5.0)
HCT: 43.5 % (ref 39.0–52.0)
Hemoglobin: 15.1 g/dL (ref 13.0–17.0)
Lymphocytes Relative: 32.2 % (ref 12.0–46.0)
Lymphs Abs: 2.7 10*3/uL (ref 0.7–4.0)
MCHC: 34.8 g/dL (ref 30.0–36.0)
MCV: 96.3 fl (ref 78.0–100.0)
Monocytes Absolute: 0.8 10*3/uL (ref 0.1–1.0)
Monocytes Relative: 9.8 % (ref 3.0–12.0)
Neutro Abs: 4.8 10*3/uL (ref 1.4–7.7)
Neutrophils Relative %: 56.1 % (ref 43.0–77.0)
Platelets: 249 10*3/uL (ref 150.0–400.0)
RBC: 4.52 Mil/uL (ref 4.22–5.81)
RDW: 12.8 % (ref 11.5–15.5)
WBC: 8.5 10*3/uL (ref 4.0–10.5)

## 2022-09-03 LAB — TSH: TSH: 1.74 u[IU]/mL (ref 0.35–5.50)

## 2022-09-04 ENCOUNTER — Encounter: Payer: Self-pay | Admitting: Family Medicine

## 2022-09-05 ENCOUNTER — Encounter: Payer: Self-pay | Admitting: Family Medicine

## 2022-09-05 DIAGNOSIS — J8410 Pulmonary fibrosis, unspecified: Secondary | ICD-10-CM | POA: Insufficient documentation

## 2022-09-05 MED ORDER — SEMAGLUTIDE (1 MG/DOSE) 4 MG/3ML ~~LOC~~ SOPN: 1.0000 mg | PEN_INJECTOR | SUBCUTANEOUS | 3 refills | Status: DC

## 2022-09-05 NOTE — Assessment & Plan Note (Signed)
Update TSH level on levothyroxine 136mg daily.

## 2022-09-05 NOTE — Assessment & Plan Note (Addendum)
Chronic, stable, present since 2006, pt asxs.

## 2022-09-05 NOTE — Assessment & Plan Note (Signed)
Stable on omeprazole '40mg'$  daily.

## 2022-09-05 NOTE — Assessment & Plan Note (Addendum)
RCRI = 0. Check CXR, EKG, labs today.  Will forward results to orthopedic office. A1 is 9.2% - above 7.5% goal. Pt aware surgery may need to be postponed per ortho. I have asked him to return in 5-6 wks for OV and fructosamine levels.  Diabetic regimen change as per below.

## 2022-09-05 NOTE — Assessment & Plan Note (Addendum)
Chronic, uncontrolled as evidenced by A1c of 9.2% today. He admits to more dietary liberties recently. Will increase ozempic to '1mg'$  weekly, he will work towards better adherence to diabetic diet. I have asked him to return in 5-6 weeks for rpt glycemic control evaluation with fructosamine. Pt agrees with plan.  New Freestyle lite glucometer Rx sent to pharmacy

## 2022-09-08 ENCOUNTER — Other Ambulatory Visit: Payer: Self-pay | Admitting: Family Medicine

## 2022-09-09 NOTE — Telephone Encounter (Signed)
Refill request for ZOLPIDEM ER 6.'25MG'$  TABLETS   LOV - 09/02/22 Next OV - 10/14/22 Last refill - 08/17/22 #20/0

## 2022-09-10 IMAGING — DX DG KNEE COMPLETE 4+V*L*
4 series · 4 of 4 positions shown · non-contrast
Comparison: None.

CLINICAL DATA: Knee pain

EXAM:
LEFT KNEE - COMPLETE 4+ VIEW

[knee ap]
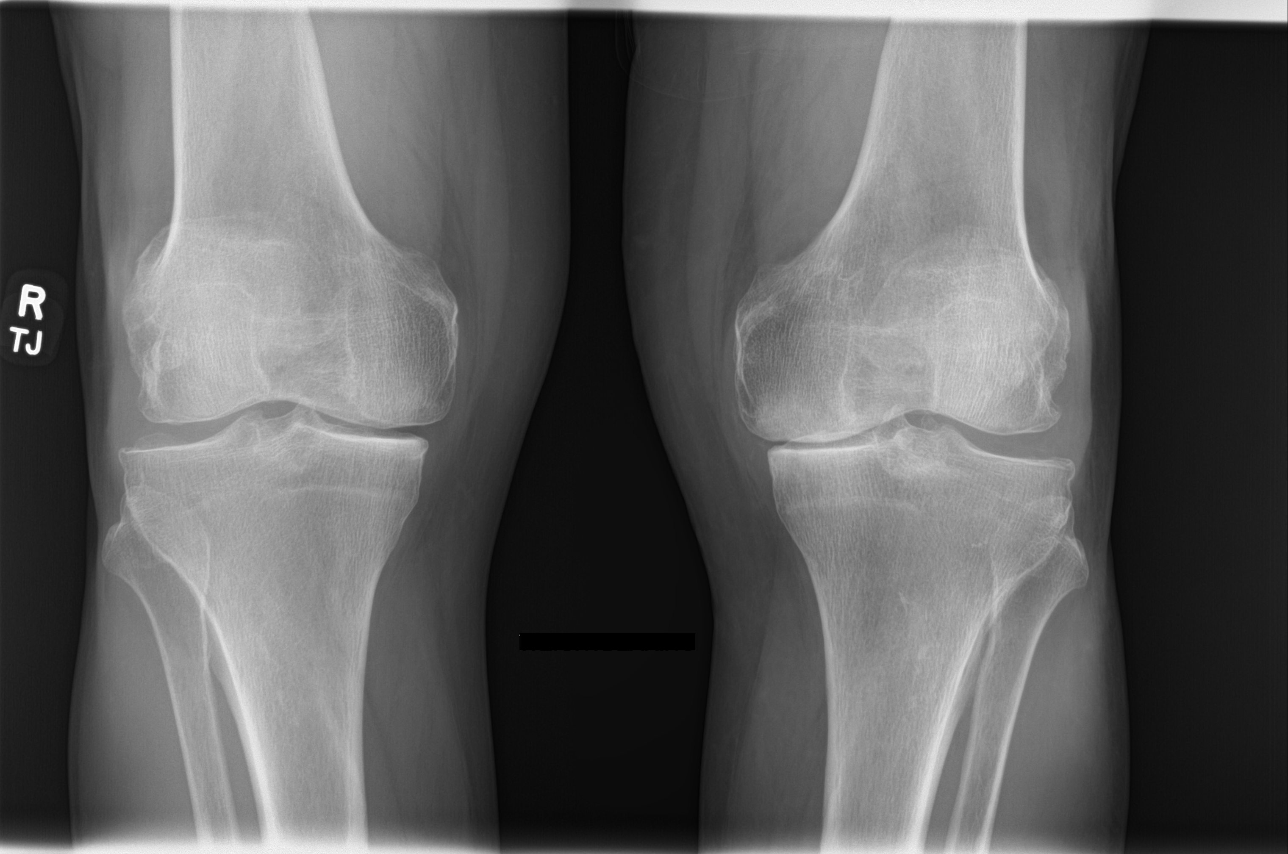

[knee tunnel]
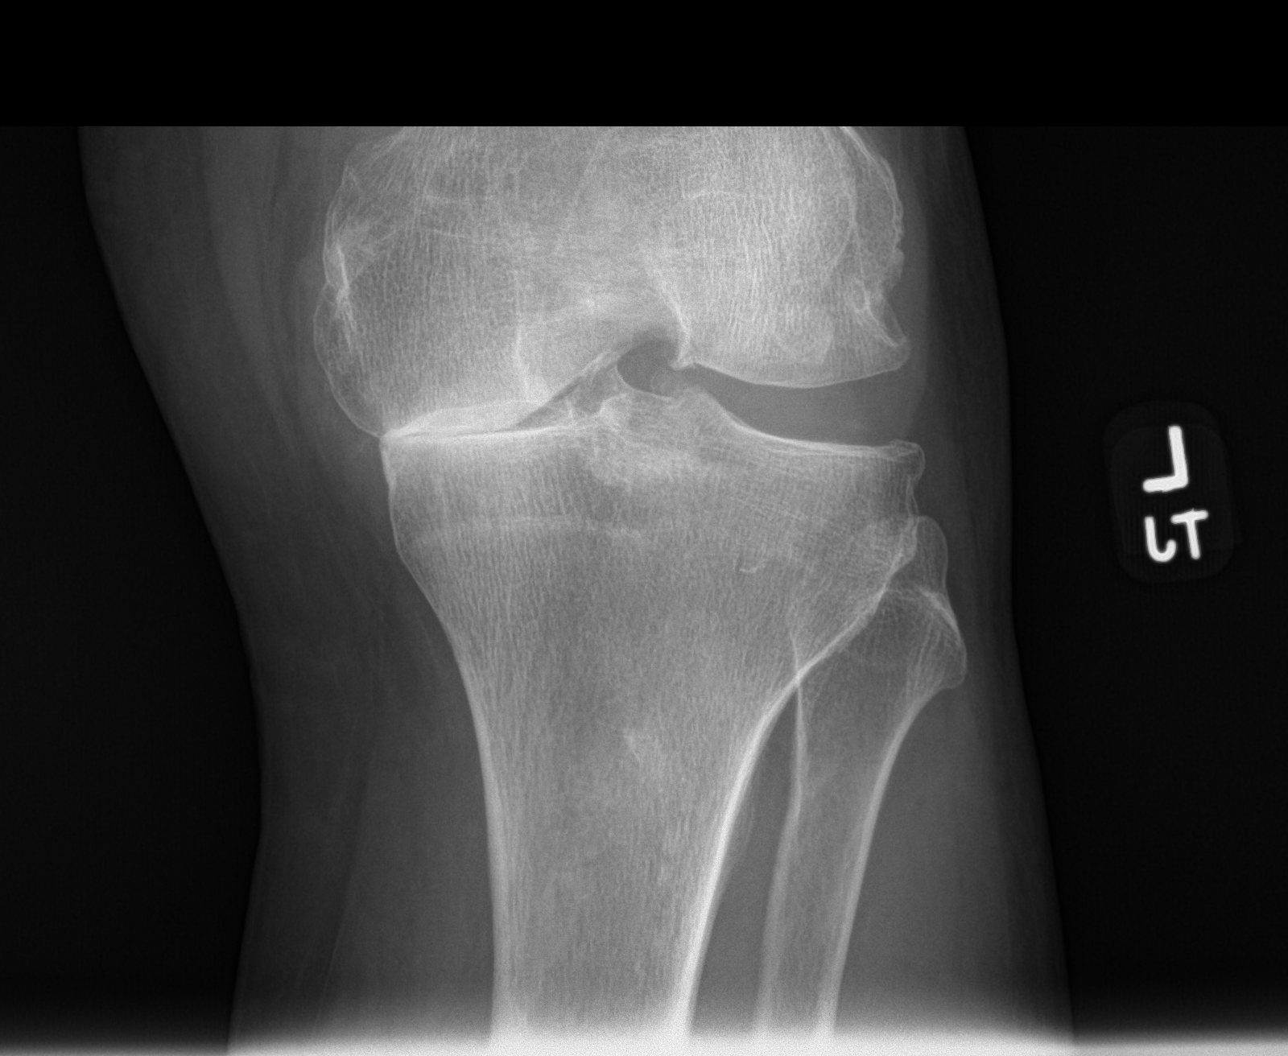

[knee lat]
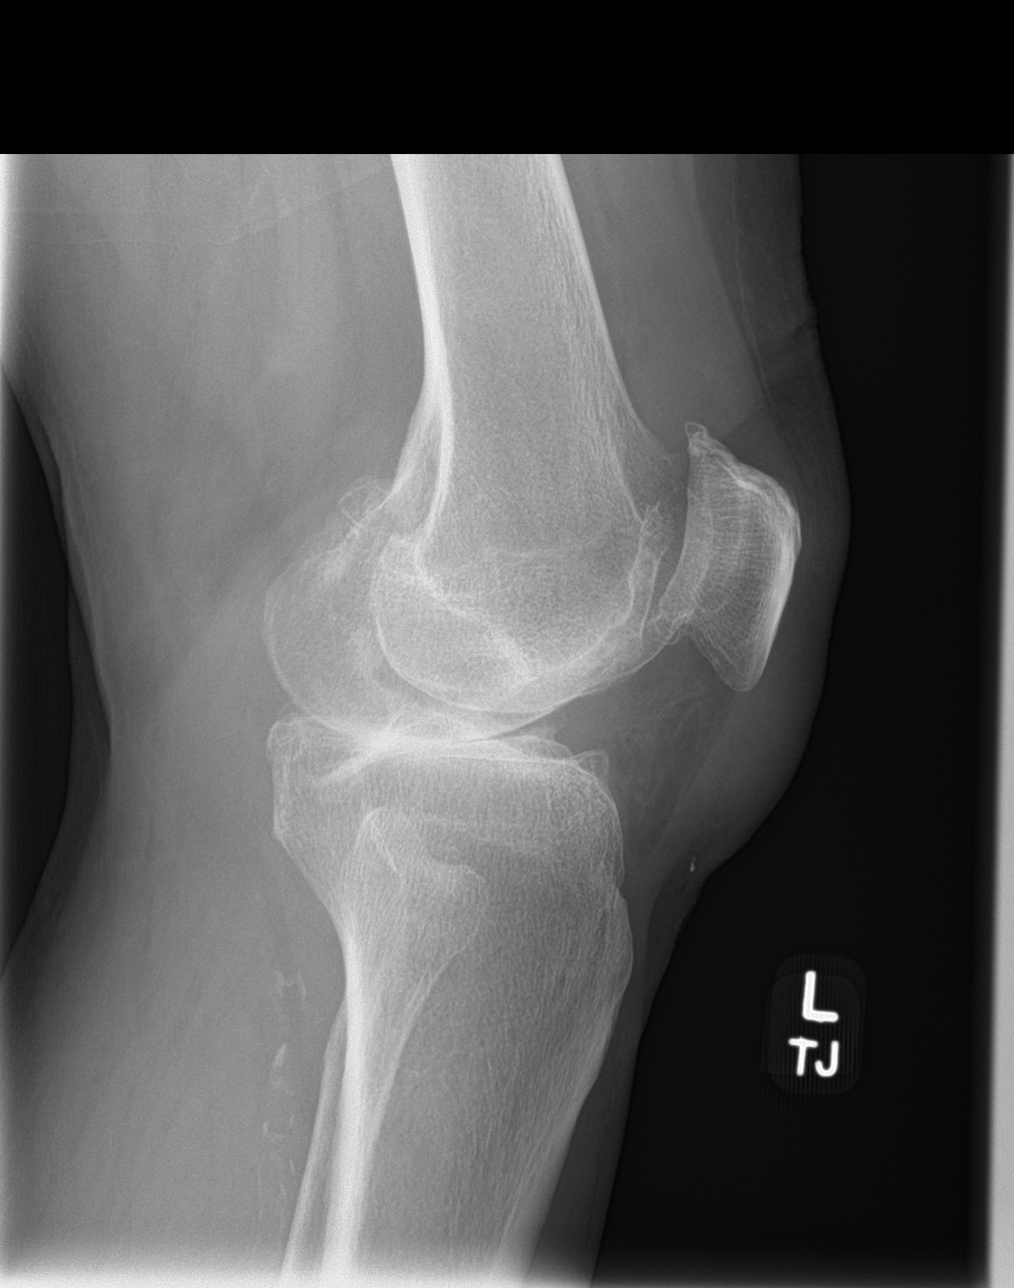

[patella skyline]
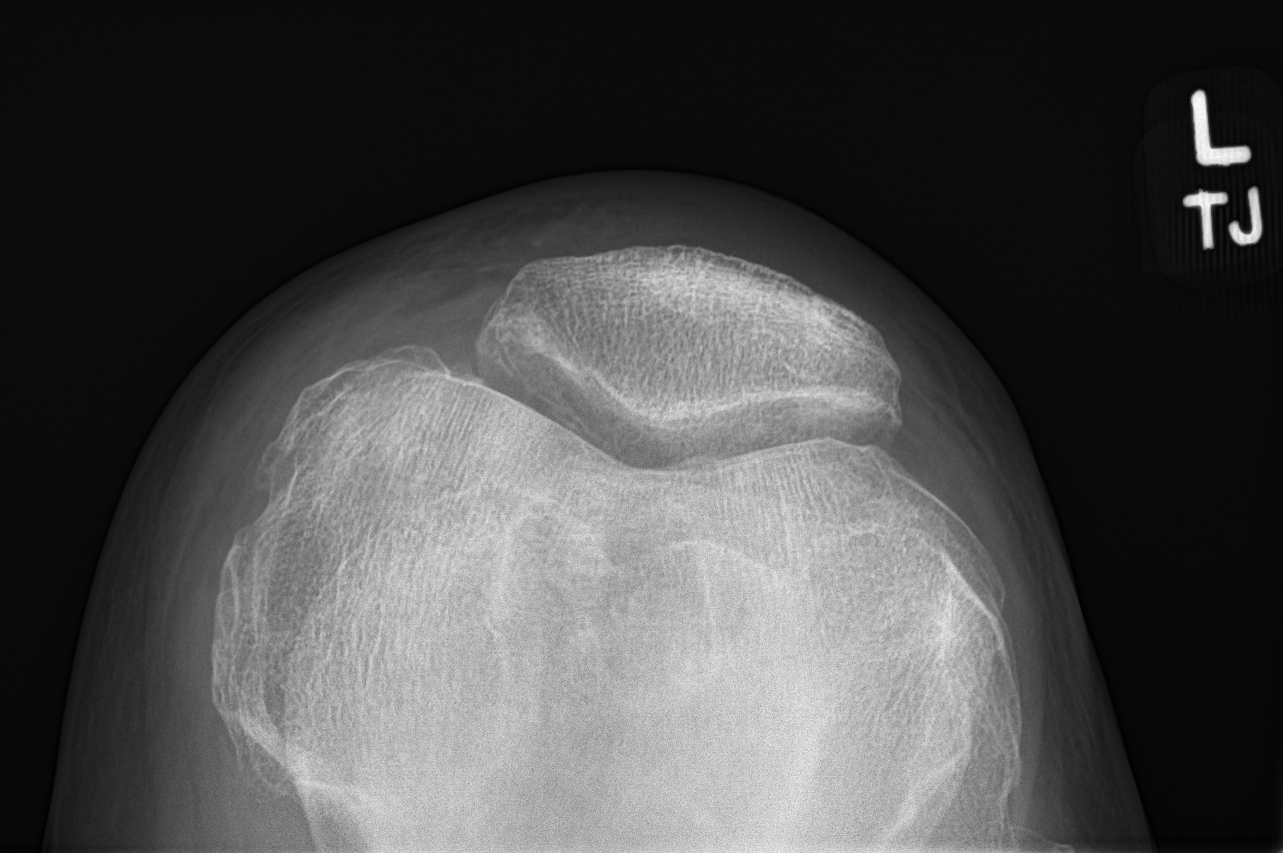

[4 of 4 positions shown; findings below may reference images not displayed]

FINDINGS: No acute fracture or dislocation of the bilateral knees. Moderate to
severe medial compartment joint space narrowing, left worse than
right. Mild patellofemoral osteoarthritis of the left knee. Small
knee joint effusion. Prepatellar soft tissue thickening. Vascular
calcifications are present.
IMPRESSION: 1. Tricompartmental osteoarthritis of the left knee, moderate to
severe within the medial compartment.
2. Small knee joint effusion.
3. Nonspecific prepatellar soft tissue thickening.

## 2022-09-10 NOTE — Telephone Encounter (Signed)
ERx 

## 2022-09-21 ENCOUNTER — Encounter: Payer: Self-pay | Admitting: Family Medicine

## 2022-09-24 NOTE — Telephone Encounter (Signed)
Pt called in requesting a call back regarding the message he sent regarding change in medication . Please advise [phone]

## 2022-09-25 NOTE — Telephone Encounter (Signed)
Pt called in requesting a call back stated message was sent on Mychart RX zolpidem (AMBIEN CR) 6.25 MG CR tablet  is not working need something to help him sleep . Please advise [phone]

## 2022-09-26 ENCOUNTER — Inpatient Hospital Stay: Admission: RE | Admit: 2022-09-26 | Payer: TRICARE For Life (TFL) | Source: Ambulatory Visit

## 2022-09-26 MED ORDER — ZOLPIDEM TARTRATE 10 MG PO TABS: 10.0000 mg | ORAL_TABLET | Freq: Every evening | ORAL | 1 refills | Status: DC | PRN

## 2022-10-14 ENCOUNTER — Encounter: Payer: Self-pay | Admitting: Family Medicine

## 2022-10-14 ENCOUNTER — Other Ambulatory Visit: Payer: Self-pay

## 2022-10-14 ENCOUNTER — Ambulatory Visit (INDEPENDENT_AMBULATORY_CARE_PROVIDER_SITE_OTHER): Payer: Medicare PPO | Admitting: Family Medicine

## 2022-10-14 VITALS — BP 122/82 | HR 72 | Temp 97.2°F | Ht 63.75 in | Wt 168.2 lb

## 2022-10-14 DIAGNOSIS — E1169 Type 2 diabetes mellitus with other specified complication: Secondary | ICD-10-CM

## 2022-10-14 MED ORDER — FREESTYLE LITE TEST VI STRP: ORAL_STRIP | 3 refills | Status: DC

## 2022-10-14 MED ORDER — FREESTYLE LITE W/DEVICE KIT: PACK | 0 refills | Status: AC

## 2022-10-14 NOTE — Patient Instructions (Addendum)
Lab test today (fructosamine level) We will send you results via mychart. Good to see you today.

## 2022-10-14 NOTE — Assessment & Plan Note (Addendum)
Chronic, anticipate overall improved control based on cbg log he keeps. Update fructosamine, continue current regimen.  Pending results, anticipate adequately low risk to proceed with planned intervention.   ADDENDUM ==> fructosamine returned equivalent to A1c of 7.1%. will let Dr Mack Guise know.

## 2022-10-14 NOTE — Progress Notes (Addendum)
Patient ID: Kevin Sheppard., male    DOB: 09/14/1951, 72 y.o.   MRN: [id number]  This visit was conducted in person.  BP 122/82   Pulse 72   Temp (!) 97.2 F (36.2 C) (Temporal)   Ht 5' 3.75" (1.619 m)   Wt 168 lb 4 oz (76.3 kg)   SpO2 96%   BMI 29.11 kg/m    CC: DM f/u visit  Subjective:   HPI: Kevin Sheppard. is a 72 y.o. male presenting on 10/14/2022 for Medical Management of Chronic Issues (Here for 5 wk DM f/u.)   Seen 09/02/2022 for preop evaluation - however A1c was elevated to 9.2% in setting of holiday dietary liberties- we increased ozempic to '1mg'$  weekly and I asked him to return today for follow up.   DM - does regularly check sugars and brings log - 120-160 fasting, occasional 90s, lowest 69. Compliant with antihyperglycemic regimen which includes: glipizide XL '5mg'$  daily, metformin '1000mg'$  bid, ozempic '1mg'$  weekly. Has dropped carbs and alcohol. Eating keto bread. Denies low sugars or hypoglycemic symptoms. Denies paresthesias, blurry vision. Last diabetic eye exam 10/2021. Glucometer brand: Freestyle Lite. Last foot exam: 03/2022. DSME: completed 2011. Lab Results  Component Value Date   HGBA1C 9.2 (A) 09/02/2022   Diabetic Foot Exam - Simple   No data filed    Lab Results  Component Value Date   MICROALBUR 4.1 (H) 01/01/2022        Relevant past medical, surgical, family and social history reviewed and updated as indicated. Interim medical history since our last visit reviewed. Allergies and medications reviewed and updated. Outpatient Medications Prior to Visit  Medication Sig Dispense Refill   atorvastatin (LIPITOR) 20 MG tablet TAKE 1 TABLET DAILY 90 tablet 3   glipiZIDE (GLUCOTROL XL) 5 MG 24 hr tablet Take 1 tablet (5 mg total) by mouth daily with breakfast. 90 tablet 3   levothyroxine (SYNTHROID) 100 MCG tablet Take 1 tablet (100 mcg total) by mouth daily with breakfast. 90 tablet 3   meloxicam (MOBIC) 15 MG tablet Take 1 tablet (15 mg total) by mouth  daily. 90 tablet 0   metFORMIN (GLUCOPHAGE) 1000 MG tablet TAKE 1 TABLET TWICE A DAY WITH MEALS 180 tablet 3   Multiple Vitamins-Minerals (MULTIVITAMIN ADULTS 50+ PO) Take 1 tablet by mouth daily.     omeprazole (PRILOSEC) 40 MG capsule Take 1 capsule (40 mg total) by mouth daily as needed. 90 capsule 0   Semaglutide, 1 MG/DOSE, 4 MG/3ML SOPN Inject 1 mg as directed once a week. 9 mL 3   tadalafil (CIALIS) 10 MG tablet TAKE ONE TABLET BY MOUTH EVERY OTHER DAY AS NEEDED FOR ERECTILE DYSFUNCTION 10 tablet 6   zolpidem (AMBIEN) 10 MG tablet Take 1 tablet (10 mg total) by mouth at bedtime as needed for sleep. 20 tablet 1   Blood Glucose Monitoring Suppl (FREESTYLE LITE) w/Device KIT Use to check sugars daily and as needed E11.69 1 kit 0   glucose blood (FREESTYLE LITE) test strip Use as instructed to check sugars daily 100 each 3   ciclopirox (LOPROX) 0.77 % cream Apply topically 2 (two) times daily. 30 g 0   diclofenac Sodium (VOLTAREN) 1 % GEL Apply 4 g topically 4 (four) times daily. 500 g 5   ibuprofen (IBU) 800 MG tablet Take 1 tablet (800 mg total) by mouth 2 (two) times daily as needed for moderate pain. 50 tablet 1   No facility-administered medications prior to visit.  Per HPI unless specifically indicated in ROS section below Review of Systems  Objective:  BP 122/82   Pulse 72   Temp (!) 97.2 F (36.2 C) (Temporal)   Ht 5' 3.75" (1.619 m)   Wt 168 lb 4 oz (76.3 kg)   SpO2 96%   BMI 29.11 kg/m   Wt Readings from Last 3 Encounters:  10/14/22 168 lb 4 oz (76.3 kg)  09/02/22 173 lb (78.5 kg)  04/09/22 174 lb 6 oz (79.1 kg)      Physical Exam Vitals and nursing note reviewed.  Constitutional:      Appearance: Normal appearance. He is not ill-appearing.  HENT:     Mouth/Throat:     Mouth: Mucous membranes are moist.     Pharynx: Oropharynx is clear. No oropharyngeal exudate or posterior oropharyngeal erythema.  Eyes:     Extraocular Movements: Extraocular movements  intact.     Pupils: Pupils are equal, round, and reactive to light.  Cardiovascular:     Rate and Rhythm: Normal rate and regular rhythm.     Pulses: Normal pulses.     Heart sounds: Normal heart sounds. No murmur heard. Pulmonary:     Effort: Pulmonary effort is normal. No respiratory distress.     Breath sounds: Normal breath sounds. No wheezing, rhonchi or rales.  Musculoskeletal:     Right lower leg: No edema.     Left lower leg: No edema.  Skin:    General: Skin is warm and dry.     Findings: No rash.  Neurological:     Mental Status: He is alert.  Psychiatric:        Mood and Affect: Mood normal.        Behavior: Behavior normal.       Results for orders placed or performed in visit on 10/14/22  Fructosamine  Result Value Ref Range   Fructosamine 292 (H) 205 - 285 umol/L   Equivalent to A1c of 7.1%  Assessment & Plan:   Problem List Items Addressed This Visit     Type 2 diabetes mellitus with other specified complication ([place]) - Primary    Chronic, anticipate overall improved control based on cbg log he keeps. Update fructosamine, continue current regimen.  Pending results, anticipate adequately low risk to proceed with planned intervention.   ADDENDUM ==> fructosamine returned equivalent to A1c of 7.1%. will let Dr Mack Guise know.       Relevant Orders   Fructosamine (Completed)     Meds ordered this encounter  Medications   Blood Glucose Monitoring Suppl (FREESTYLE LITE) w/Device KIT    Sig: Use to check sugars daily and as needed E11.69    Dispense:  1 kit    Refill:  0    Orders Placed This Encounter  Procedures   Fructosamine    Patient Instructions  Lab test today (fructosamine level) We will send you results via mychart. Good to see you today.   Follow up plan: Return in about 7 weeks (around 12/02/2022).  Ria Bush, MD

## 2022-10-14 NOTE — Telephone Encounter (Signed)
Pt here for OV and requested refill of test strips and wanted instructions changed to "check BS TID".   I changed sig and sent in refill.

## 2022-10-16 ENCOUNTER — Encounter: Payer: Self-pay | Admitting: Family Medicine

## 2022-10-16 ENCOUNTER — Other Ambulatory Visit: Payer: Self-pay

## 2022-10-16 DIAGNOSIS — E1169 Type 2 diabetes mellitus with other specified complication: Secondary | ICD-10-CM

## 2022-10-16 LAB — FRUCTOSAMINE: Fructosamine: 292 umol/L — ABNORMAL HIGH (ref 205–285)

## 2022-10-16 MED ORDER — FREESTYLE LITE TEST VI STRP: ORAL_STRIP | 3 refills | Status: AC

## 2022-10-16 NOTE — Telephone Encounter (Signed)
Received faxed refill request for test strips. E-scribed refill.

## 2022-10-17 NOTE — Telephone Encounter (Signed)
Replied via result note.

## 2022-10-17 NOTE — Telephone Encounter (Signed)
Noted  

## 2022-10-19 ENCOUNTER — Encounter: Payer: Self-pay | Admitting: Family Medicine

## 2022-10-22 ENCOUNTER — Other Ambulatory Visit: Payer: Self-pay

## 2022-10-22 DIAGNOSIS — E1169 Type 2 diabetes mellitus with other specified complication: Secondary | ICD-10-CM

## 2022-10-22 MED ORDER — GLIPIZIDE ER 5 MG PO TB24: 5.0000 mg | ORAL_TABLET | Freq: Every day | ORAL | 0 refills | Status: DC

## 2022-10-22 NOTE — Telephone Encounter (Signed)
E-scribed refill 

## 2022-11-02 ENCOUNTER — Encounter: Payer: Self-pay | Admitting: Family Medicine

## 2022-11-02 ENCOUNTER — Other Ambulatory Visit: Payer: Self-pay | Admitting: Family Medicine

## 2022-11-02 DIAGNOSIS — N529 Male erectile dysfunction, unspecified: Secondary | ICD-10-CM

## 2022-11-04 ENCOUNTER — Other Ambulatory Visit: Payer: Self-pay | Admitting: Family Medicine

## 2022-11-04 MED ORDER — LEVOTHYROXINE SODIUM 100 MCG PO TABS: 100.0000 ug | ORAL_TABLET | Freq: Every day | ORAL | 0 refills | Status: DC

## 2022-11-04 NOTE — Telephone Encounter (Signed)
Spoke with pt relaying Dr. Synthia Innocent message.

## 2022-11-04 NOTE — Telephone Encounter (Signed)
Last OV:  10/14/22, 5 wk DM f/u Next OV:  01/12/23, CPE

## 2022-11-04 NOTE — Telephone Encounter (Signed)
ERx partial refill locally  plz notify pt

## 2022-11-04 NOTE — Telephone Encounter (Signed)
Patient called and said he gets his levothyroxine through mail order but he put in for it late so its gonna be delyed before he gets it. He has been out for 3 days. He wanted to know if a partial fill can be called into [place], [place]. He said for maybe a week or 2

## 2022-11-07 ENCOUNTER — Other Ambulatory Visit: Payer: Self-pay | Admitting: Orthopedic Surgery

## 2022-11-07 ENCOUNTER — Encounter
Admission: RE | Admit: 2022-11-07 | Discharge: 2022-11-07 | Disposition: A | Discharge status: Home / Self Care | Payer: Medicare PPO | Source: Ambulatory Visit | Attending: Orthopedic Surgery | Admitting: Orthopedic Surgery

## 2022-11-07 DIAGNOSIS — E1169 Type 2 diabetes mellitus with other specified complication: Secondary | ICD-10-CM | POA: Diagnosis not present

## 2022-11-07 DIAGNOSIS — Z794 Long term (current) use of insulin: Secondary | ICD-10-CM | POA: Insufficient documentation

## 2022-11-07 DIAGNOSIS — Z01812 Encounter for preprocedural laboratory examination: Secondary | ICD-10-CM | POA: Diagnosis not present

## 2022-11-07 DIAGNOSIS — Z01818 Encounter for other preprocedural examination: Secondary | ICD-10-CM

## 2022-11-07 HISTORY — DX: Personal history of urinary calculi: Z87.442

## 2022-11-07 HISTORY — DX: Male erectile dysfunction, unspecified: N52.9

## 2022-11-07 HISTORY — DX: Gastro-esophageal reflux disease without esophagitis: K21.9

## 2022-11-07 HISTORY — DX: Unilateral primary osteoarthritis, right knee: M17.11

## 2022-11-07 HISTORY — DX: Left anterior fascicular block: I44.4

## 2022-11-07 LAB — APTT: aPTT: 26 seconds (ref 24–36)

## 2022-11-07 LAB — CBC WITH DIFFERENTIAL/PLATELET
Abs Immature Granulocytes: 0.02 10*3/uL (ref 0.00–0.07)
Basophils Absolute: 0 10*3/uL (ref 0.0–0.1)
Basophils Relative: 1 %
Eosinophils Absolute: 0.1 10*3/uL (ref 0.0–0.5)
Eosinophils Relative: 2 %
HCT: 41.1 % (ref 39.0–52.0)
Hemoglobin: 14.5 g/dL (ref 13.0–17.0)
Immature Granulocytes: 0 %
Lymphocytes Relative: 38 %
Lymphs Abs: 2.1 10*3/uL (ref 0.7–4.0)
MCH: 33.4 pg (ref 26.0–34.0)
MCHC: 35.3 g/dL (ref 30.0–36.0)
MCV: 94.7 fL (ref 80.0–100.0)
Monocytes Absolute: 0.7 10*3/uL (ref 0.1–1.0)
Monocytes Relative: 12 %
Neutro Abs: 2.7 10*3/uL (ref 1.7–7.7)
Neutrophils Relative %: 47 %
Platelets: 168 10*3/uL (ref 150–400)
RBC: 4.34 MIL/uL (ref 4.22–5.81)
RDW: 11.5 % (ref 11.5–15.5)
WBC: 5.5 10*3/uL (ref 4.0–10.5)
nRBC: 0 % (ref 0.0–0.2)

## 2022-11-07 LAB — URINALYSIS, ROUTINE W REFLEX MICROSCOPIC
Bilirubin Urine: NEGATIVE
Glucose, UA: NEGATIVE mg/dL
Hgb urine dipstick: NEGATIVE
Ketones, ur: NEGATIVE mg/dL
Leukocytes,Ua: NEGATIVE
Nitrite: NEGATIVE
Protein, ur: NEGATIVE mg/dL
Specific Gravity, Urine: 1.023 (ref 1.005–1.030)
pH: 7 (ref 5.0–8.0)

## 2022-11-07 LAB — BASIC METABOLIC PANEL
Anion gap: 9 (ref 5–15)
BUN: 16 mg/dL (ref 8–23)
CO2: 24 mmol/L (ref 22–32)
Calcium: 9.3 mg/dL (ref 8.9–10.3)
Chloride: 105 mmol/L (ref 98–111)
Creatinine, Ser: 0.66 mg/dL (ref 0.61–1.24)
GFR, Estimated: >60 mL/min (ref >60)
Glucose, Bld: 97 mg/dL (ref 70–99)
Potassium: 3.7 mmol/L (ref 3.5–5.1)
Sodium: 138 mmol/L (ref 135–145)

## 2022-11-07 LAB — TYPE AND SCREEN
ABO/RH(D): B POS
Antibody Screen: NEGATIVE

## 2022-11-07 LAB — PROTIME-INR
INR: 1 (ref 0.8–1.2)
Prothrombin Time: 12.7 seconds (ref 11.4–15.2)

## 2022-11-07 LAB — SURGICAL PCR SCREEN
MRSA, PCR: NEGATIVE
Staphylococcus aureus: POSITIVE — AB

## 2022-11-07 LAB — HEMOGLOBIN A1C
Hgb A1c MFr Bld: 6.8 % — ABNORMAL HIGH (ref 4.8–5.6)
Mean Plasma Glucose: 148.46 mg/dL

## 2022-11-07 MED ORDER — TRANEXAMIC ACID 1000 MG/10ML IV SOLN: 1000.0000 mg | INTRAVENOUS | Status: AC

## 2022-11-07 NOTE — Patient Instructions (Addendum)
Your procedure is scheduled on: Thursday, March 7 Report to the Registration Desk on the 1st floor of the [facility]. To find out your arrival time, please call [phone] between 1PM - 3PM on: Wednesday, March 6 If your arrival time is 6:00 am, do not arrive before that time as the [place] entrance doors do not open until 6:00 am.  REMEMBER: Instructions that are not followed completely may result in serious medical risk, up to and including death; or upon the discretion of your surgeon and anesthesiologist your surgery may need to be rescheduled.  Do not eat or drink after midnight the night before surgery.  No gum chewing or hard candies.  One week prior to surgery: starting February 29 Stop MELOXICAM AND Anti-inflammatories (NSAIDS) such as Advil, Aleve, Ibuprofen, Motrin, Naproxen, Naprosyn and Aspirin based products such as Excedrin, Goody's Powder, BC Powder. Stop ANY OVER THE COUNTER supplements until after surgery. Stop multiple vitamins. You may however, continue to take Tylenol if needed for pain up until the day of surgery.  Continue taking all prescribed medications with the exception of the following:  Ozempic (semaglutide) - hold 7 days before surgery. The last day to take Ozempic is Tuesday, February 27. Do NOT take Ozempic on Tuesday, March 5. Resume AFTER surgery on your regular day, Tuesday, March 12.  Metformin - hold 2 days before surgery. Last day to take Metformin is Monday, March 4. Resume AFTER surgery.  TAKE ONLY THESE MEDICATIONS THE MORNING OF SURGERY WITH A SIP OF WATER:  Omeprazole (Prilosec) - (take one the night before and one on the morning of surgery - helps to prevent nausea after surgery.) Atorvastatin (Lipitor) Levothyroxine  No Alcohol for 24 hours before or after surgery.  No Smoking including e-cigarettes for 24 hours before surgery.  No chewable tobacco products for at least 6 hours before surgery.  No nicotine patches on the day of  surgery.  Do not use any "recreational" drugs for at least a week (preferably 2 weeks) before your surgery.  Please be advised that the combination of cocaine and anesthesia may have negative outcomes, up to and including death. If you test positive for cocaine, your surgery will be cancelled.  On the morning of surgery brush your teeth with toothpaste and water, you may rinse your mouth with mouthwash if you wish. Do not swallow any toothpaste or mouthwash.  Use CHG Soap as directed on instruction sheet.  Do not wear jewelry.  Do not wear lotions, powders, or perfumes.   Do not shave body hair from the neck down 48 hours before surgery.  Contact lenses, hearing aids and dentures may not be worn into surgery.  Do not bring valuables to the hospital. [facility] is not responsible for any missing/lost belongings or valuables.   Notify your doctor if there is any change in your medical condition (cold, fever, infection).  Wear comfortable clothing (specific to your surgery type) to the hospital.  After surgery, you can help prevent lung complications by doing breathing exercises.  Take deep breaths and cough every 1-2 hours. Your doctor may order a device called an Incentive Spirometer to help you take deep breaths.  If you are being admitted to the hospital overnight, leave your suitcase in the car. After surgery it may be brought to your room.  In case of increased patient census, it may be necessary for you, the patient, to continue your postoperative care in the Same Day Surgery department.  If you  are being discharged the day of surgery, you will not be allowed to drive home. You will need a responsible individual to drive you home and stay with you for 24 hours after surgery.   If you are taking public transportation, you will need to have a responsible individual with you.  Please call the [facility]. at [phone] if you have any questions about these  instructions.  Surgery Visitation Policy:  Patients undergoing a surgery or procedure may have two family members or support persons with them as long as the person is not COVID-19 positive or experiencing its symptoms.   Inpatient Visitation:    Visiting hours are 7 a.m. to 8 p.m. Up to four visitors are allowed at one time in a patient room. The visitors may rotate out with other people during the day. One designated support person (adult) may remain overnight.  Due to an increase in RSV and influenza rates and associated hospitalizations, children ages 26 and under will not be able to visit patients in [facility]. Masks continue to be strongly recommended.     Preparing for Surgery with CHLORHEXIDINE GLUCONATE (CHG) Soap  Chlorhexidine Gluconate (CHG) Soap  o An antiseptic cleaner that kills germs and bonds with the skin to continue killing germs even after washing  o Used for showering the night before surgery and morning of surgery  Before surgery, you can play an important role by reducing the number of germs on your skin.  CHG (Chlorhexidine gluconate) soap is an antiseptic cleanser which kills germs and bonds with the skin to continue killing germs even after washing.  Please do not use if you have an allergy to CHG or antibacterial soaps. If your skin becomes reddened/irritated stop using the CHG.  1. Shower the NIGHT BEFORE SURGERY and the MORNING OF SURGERY with CHG soap.  2. If you choose to wash your hair, wash your hair first as usual with your normal shampoo.  3. After shampooing, rinse your hair and body thoroughly to remove the shampoo.  4. Use CHG as you would any other liquid soap. You can apply CHG directly to the skin and wash gently with a scrungie or a clean washcloth.  5. Apply the CHG soap to your body only from the neck down. Do not use on open wounds or open sores. Avoid contact with your eyes, ears, mouth, and genitals (private parts). Wash  face and genitals (private parts) with your normal soap.  6. Wash thoroughly, paying special attention to the area where your surgery will be performed.  7. Thoroughly rinse your body with warm water.  8. Do not shower/wash with your normal soap after using and rinsing off the CHG soap.  9. Pat yourself dry with a clean towel.  10. Wear clean pajamas to bed the night before surgery.  12. Place clean sheets on your bed the night of your first shower and do not sleep with pets.  13. Shower again with the CHG soap on the day of surgery prior to arriving at the hospital.  14. Do not apply any deodorants/lotions/powders.  15. Please wear clean clothes to the hospital.  Preoperative Educational Videos for Total Hip, Knee and Shoulder Replacements  To better prepare for surgery, please view our videos that explain the physical activity and discharge planning required to have the best surgical recovery at [facility].  [email or web address]  Questions? Call [phone] or email [email or web address]

## 2022-11-08 ENCOUNTER — Encounter: Payer: Self-pay | Admitting: Family Medicine

## 2022-11-10 ENCOUNTER — Encounter: Payer: Self-pay | Admitting: Family Medicine

## 2022-11-11 ENCOUNTER — Other Ambulatory Visit: Payer: Self-pay | Admitting: Family Medicine

## 2022-11-11 ENCOUNTER — Other Ambulatory Visit: Payer: Self-pay | Admitting: Orthopedic Surgery

## 2022-11-11 DIAGNOSIS — G4726 Circadian rhythm sleep disorder, shift work type: Secondary | ICD-10-CM

## 2022-11-11 NOTE — Progress Notes (Signed)
  Perioperative Services Pre-Admission/Anesthesia Testing    Date: 11/11/22  Name: Kevin Sheppard. MRN:   [id number]  Re: GLP-1 clearance and provider recommendations   Planned Surgical Procedure(s):    Case: [id number] Date/Time: 11/20/22 0730   Procedure: TOTAL KNEE ARTHROPLASTY (Right: Knee)   Anesthesia type: Choice   Pre-op diagnosis: Right Knee Osteoarthritis   Location: [facility] OR ROOM 02 / Progress ORS FOR ANESTHESIA GROUP   Surgeons: Thornton Park, MD      Clinical Notes:  Patient is scheduled for the above procedure with the indicated provider/surgeon. In review of his medication reconciliation it was noted that patient is on a prescribed GLP-1 medication. Per guidelines issued by the American Society of Anesthesiologists (ASA), it is recommended that these medications be held for 7 days prior to the patient undergoing any type of elective surgical procedure. The patient is taking the following GLP-1 medication:  [x]$  SEMAGLUTIDE   []$  EXENATIDE  []$  LIRAGLUTIDE   []$  LIXISENATIDE  []$  DULAGLUTIDE     []$  OTHER GLP-1 medication: _______________  Reached out to prescribing provider Danise Mina, MD) to make them aware of the guidelines from anesthesia. Given that this patient takes the prescribed GLP-1 medication for his  diabetes diagnosis, rather than for weight loss, recommendations from the prescribing provider were solicited. Prescribing provider made aware of the following so that informed decision/POC can be developed for this patient that may be taking medications belonging to these drug classes:  Oral GLP-1 medications will be held 1 day prior to surgery.  Injectable GLP-1 medications will be held 7 days prior to surgery.  Metformin is routinely held 48 hours prior to surgery due to renal concerns, potential need for contrasted imaging perioperatively, and the potential for tissue hypoxia leading to drug induced lactic acidosis.  All SGLT2i medications are held 72 hours  prior to surgery as they can be associated with the increased potential for developing euglycemic diabetic ketoacidosis (EDKA).   Impression and Plan:  Kevin Sheppard. is on a prescribed GLP-1 medication, which induces the known side effect of decreased gastric emptying. Efforts are bring made to mitigate the risk of perioperative hyperglycemic events, as elevated blood glucose levels have been found to contribute to intra/postoperative complications. Additionally, hyperglycemic extremes can potentially necessitate the postponing of a patient's elective case in order to better optimize perioperative glycemic control, again with the aforementioned guidelines in place. With this in mind, recommendations have been sought from the prescribing provider, who has cleared patient to proceed with holding the prescribed GLP-1 as per the guidelines from the ASA.   Provider recommending: no further recommendations received from the prescribing provider.  Copy of signed clearance and recommendations placed on patient's chart for inclusion in their medical record and for review by the surgical/anesthetic team on the day of his procedure.   Honor Loh, MSN, APRN, FNP-C, CEN [facility]  Peri-operative Services Nurse Practitioner Phone: [phone] 11/11/22 8:37 AM  NOTE: This note has been prepared using Dragon dictation software. Despite my best ability to proofread, there is always the potential that unintentional transcriptional errors may still occur from this process.

## 2022-11-12 NOTE — Telephone Encounter (Signed)
Name of Medication: Ambien Name of Pharmacy: [facility] Last Fill or Written Date and Quantity: 10/14/22, #20 Last Office Visit and Type: 10/14/22, 5 wk DM f/u Next Office Visit and Type: 01/12/23, CPE Last Controlled Substance Agreement Date: none Last UDS: none

## 2022-11-13 NOTE — Telephone Encounter (Signed)
ERx 

## 2022-11-16 ENCOUNTER — Other Ambulatory Visit: Payer: Self-pay | Admitting: Family Medicine

## 2022-11-16 DIAGNOSIS — K219 Gastro-esophageal reflux disease without esophagitis: Secondary | ICD-10-CM

## 2022-11-20 ENCOUNTER — Other Ambulatory Visit: Payer: Self-pay

## 2022-11-20 ENCOUNTER — Ambulatory Visit: Payer: Medicare PPO | Admitting: Urgent Care

## 2022-11-20 ENCOUNTER — Observation Stay
Admission: RE | Admit: 2022-11-20 | Discharge: 2022-11-21 | Disposition: A | Discharge status: Home / Self Care | Payer: Medicare PPO | Attending: Orthopedic Surgery | Admitting: Orthopedic Surgery

## 2022-11-20 ENCOUNTER — Encounter: Payer: Self-pay | Admitting: Orthopedic Surgery

## 2022-11-20 ENCOUNTER — Encounter
Admission: RE | Disposition: A | Discharge status: Home / Self Care | Payer: Self-pay | Source: Home / Self Care | Attending: Orthopedic Surgery

## 2022-11-20 ENCOUNTER — Observation Stay: Payer: Medicare PPO

## 2022-11-20 DIAGNOSIS — Z794 Long term (current) use of insulin: Secondary | ICD-10-CM

## 2022-11-20 DIAGNOSIS — Z7984 Long term (current) use of oral hypoglycemic drugs: Secondary | ICD-10-CM | POA: Diagnosis not present

## 2022-11-20 DIAGNOSIS — Z79899 Other long term (current) drug therapy: Secondary | ICD-10-CM | POA: Insufficient documentation

## 2022-11-20 DIAGNOSIS — E1169 Type 2 diabetes mellitus with other specified complication: Secondary | ICD-10-CM

## 2022-11-20 DIAGNOSIS — Z6830 Body mass index (BMI) 30.0-30.9, adult: Secondary | ICD-10-CM | POA: Diagnosis not present

## 2022-11-20 DIAGNOSIS — Z96651 Presence of right artificial knee joint: Secondary | ICD-10-CM | POA: Diagnosis not present

## 2022-11-20 DIAGNOSIS — M1711 Unilateral primary osteoarthritis, right knee: Secondary | ICD-10-CM | POA: Diagnosis not present

## 2022-11-20 DIAGNOSIS — Z8616 Personal history of COVID-19: Secondary | ICD-10-CM | POA: Diagnosis not present

## 2022-11-20 DIAGNOSIS — E039 Hypothyroidism, unspecified: Secondary | ICD-10-CM | POA: Diagnosis not present

## 2022-11-20 DIAGNOSIS — E669 Obesity, unspecified: Secondary | ICD-10-CM | POA: Diagnosis not present

## 2022-11-20 DIAGNOSIS — Z01812 Encounter for preprocedural laboratory examination: Secondary | ICD-10-CM

## 2022-11-20 DIAGNOSIS — E119 Type 2 diabetes mellitus without complications: Secondary | ICD-10-CM | POA: Diagnosis not present

## 2022-11-20 DIAGNOSIS — Z471 Aftercare following joint replacement surgery: Secondary | ICD-10-CM | POA: Diagnosis not present

## 2022-11-20 HISTORY — PX: TOTAL KNEE ARTHROPLASTY: SHX125

## 2022-11-20 LAB — ABO/RH: ABO/RH(D): B POS

## 2022-11-20 LAB — GLUCOSE, CAPILLARY
Glucose-Capillary: 143 mg/dL — ABNORMAL HIGH (ref 70–99)
Glucose-Capillary: 140 mg/dL — ABNORMAL HIGH (ref 70–99)
Glucose-Capillary: 177 mg/dL — ABNORMAL HIGH (ref 70–99)

## 2022-11-20 SURGERY — ARTHROPLASTY, KNEE, TOTAL
Anesthesia: Spinal | Site: Knee | Laterality: Right

## 2022-11-20 MED ORDER — FENTANYL CITRATE (PF) 100 MCG/2ML IJ SOLN
INTRAMUSCULAR | Status: AC
  Filled 2022-11-20: qty 2

## 2022-11-20 MED ORDER — ONDANSETRON HCL 4 MG/2ML IJ SOLN: 4.0000 mg | Freq: Once | INTRAMUSCULAR | Status: DC | PRN

## 2022-11-20 MED ORDER — FENTANYL CITRATE (PF) 100 MCG/2ML IJ SOLN: 25.0000 ug | INTRAMUSCULAR | Status: DC | PRN

## 2022-11-20 MED ORDER — TRANEXAMIC ACID-NACL 1000-0.7 MG/100ML-% IV SOLN
INTRAVENOUS | Status: DC | PRN
  Administered 2022-11-20: 1000 mg via INTRAVENOUS

## 2022-11-20 MED ORDER — ACETAMINOPHEN 325 MG PO TABS: 325.0000 mg | ORAL_TABLET | Freq: Four times a day (QID) | ORAL | Status: DC | PRN

## 2022-11-20 MED ORDER — METFORMIN HCL 500 MG PO TABS
1000.0000 mg | ORAL_TABLET | Freq: Two times a day (BID) | ORAL | Status: DC
  Administered 2022-11-20 – 2022-11-21 (×2): 1000 mg via ORAL

## 2022-11-20 MED ORDER — OXYCODONE HCL 5 MG PO TABS
ORAL_TABLET | ORAL | Status: AC
  Filled 2022-11-20: qty 2

## 2022-11-20 MED ORDER — DOCUSATE SODIUM 100 MG PO CAPS
ORAL_CAPSULE | ORAL | Status: AC
  Filled 2022-11-20: qty 1

## 2022-11-20 MED ORDER — LEVOTHYROXINE SODIUM 100 MCG PO TABS
100.0000 ug | ORAL_TABLET | Freq: Every day | ORAL | Status: DC
  Administered 2022-11-21: 100 ug via ORAL
  Filled 2022-11-20: qty 1

## 2022-11-20 MED ORDER — PANTOPRAZOLE SODIUM 40 MG PO TBEC: 40.0000 mg | DELAYED_RELEASE_TABLET | Freq: Every day | ORAL | Status: DC

## 2022-11-20 MED ORDER — ENOXAPARIN SODIUM 40 MG/0.4ML IJ SOSY
40.0000 mg | PREFILLED_SYRINGE | INTRAMUSCULAR | Status: DC
  Administered 2022-11-21: 40 mg via SUBCUTANEOUS

## 2022-11-20 MED ORDER — OXYCODONE HCL 5 MG/5ML PO SOLN: 5.0000 mg | Freq: Once | ORAL | Status: DC | PRN

## 2022-11-20 MED ORDER — EPHEDRINE 5 MG/ML INJ
INTRAVENOUS | Status: AC
  Filled 2022-11-20: qty 5

## 2022-11-20 MED ORDER — EPHEDRINE SULFATE (PRESSORS) 50 MG/ML IJ SOLN
INTRAMUSCULAR | Status: DC | PRN
  Administered 2022-11-20 (×2): 10 mg via INTRAVENOUS

## 2022-11-20 MED ORDER — SENNOSIDES-DOCUSATE SODIUM 8.6-50 MG PO TABS: 1.0000 | ORAL_TABLET | Freq: Every evening | ORAL | Status: DC | PRN

## 2022-11-20 MED ORDER — CHLORHEXIDINE GLUCONATE CLOTH 2 % EX PADS
6.0000 | MEDICATED_PAD | Freq: Once | CUTANEOUS | Status: AC
  Administered 2022-11-20: 6 via TOPICAL

## 2022-11-20 MED ORDER — ATORVASTATIN CALCIUM 20 MG PO TABS: 20.0000 mg | ORAL_TABLET | Freq: Every day | ORAL | Status: DC

## 2022-11-20 MED ORDER — ACETAMINOPHEN 500 MG PO TABS
1000.0000 mg | ORAL_TABLET | Freq: Four times a day (QID) | ORAL | Status: AC
  Administered 2022-11-20 – 2022-11-21 (×2): 1000 mg via ORAL

## 2022-11-20 MED ORDER — CEFAZOLIN SODIUM-DEXTROSE 2-4 GM/100ML-% IV SOLN
2.0000 g | INTRAVENOUS | Status: AC
  Administered 2022-11-20: 2 g via INTRAVENOUS

## 2022-11-20 MED ORDER — BISACODYL 5 MG PO TBEC: 10.0000 mg | DELAYED_RELEASE_TABLET | Freq: Every day | ORAL | Status: DC | PRN

## 2022-11-20 MED ORDER — GLIPIZIDE ER 5 MG PO TB24
5.0000 mg | ORAL_TABLET | Freq: Every day | ORAL | Status: DC
  Administered 2022-11-21: 5 mg via ORAL
  Filled 2022-11-20: qty 1

## 2022-11-20 MED ORDER — BUPIVACAINE LIPOSOME 1.3 % IJ SUSP
INTRAMUSCULAR | Status: AC
  Filled 2022-11-20: qty 20

## 2022-11-20 MED ORDER — DIPHENHYDRAMINE HCL 12.5 MG/5ML PO ELIX: 12.5000 mg | ORAL_SOLUTION | ORAL | Status: DC | PRN

## 2022-11-20 MED ORDER — CHLORHEXIDINE GLUCONATE 0.12 % MT SOLN
15.0000 mL | Freq: Once | OROMUCOSAL | Status: AC
  Administered 2022-11-20: 15 mL via OROMUCOSAL

## 2022-11-20 MED ORDER — ORAL CARE MOUTH RINSE: 15.0000 mL | Freq: Once | OROMUCOSAL | Status: AC

## 2022-11-20 MED ORDER — SODIUM CHLORIDE 0.9 % IV SOLN
INTRAVENOUS | Status: DC | PRN
  Administered 2022-11-20: 60 mL

## 2022-11-20 MED ORDER — METFORMIN HCL 500 MG PO TABS
ORAL_TABLET | ORAL | Status: AC
  Filled 2022-11-20: qty 2

## 2022-11-20 MED ORDER — 0.9 % SODIUM CHLORIDE (POUR BTL) OPTIME
TOPICAL | Status: DC | PRN
  Administered 2022-11-20: 1000 mL

## 2022-11-20 MED ORDER — PROPOFOL 500 MG/50ML IV EMUL
INTRAVENOUS | Status: DC | PRN
  Administered 2022-11-20: 50 ug/kg/min via INTRAVENOUS

## 2022-11-20 MED ORDER — ACETAMINOPHEN 500 MG PO TABS
1000.0000 mg | ORAL_TABLET | ORAL | Status: AC
  Administered 2022-11-20: 1000 mg via ORAL

## 2022-11-20 MED ORDER — DOCUSATE SODIUM 100 MG PO CAPS
100.0000 mg | ORAL_CAPSULE | Freq: Two times a day (BID) | ORAL | Status: DC
  Administered 2022-11-20: 100 mg via ORAL

## 2022-11-20 MED ORDER — BUPIVACAINE HCL (PF) 0.5 % IJ SOLN
INTRAMUSCULAR | Status: DC | PRN
  Administered 2022-11-20: 2.5 mL via INTRATHECAL

## 2022-11-20 MED ORDER — OXYCODONE HCL 5 MG PO TABS: 10.0000 mg | ORAL_TABLET | ORAL | Status: DC | PRN

## 2022-11-20 MED ORDER — METHOCARBAMOL 1000 MG/10ML IJ SOLN: 500.0000 mg | Freq: Four times a day (QID) | INTRAVENOUS | Status: DC | PRN

## 2022-11-20 MED ORDER — FENTANYL CITRATE (PF) 100 MCG/2ML IJ SOLN
INTRAMUSCULAR | Status: DC | PRN
  Administered 2022-11-20 (×2): 50 ug via INTRAVENOUS

## 2022-11-20 MED ORDER — TRAMADOL HCL 50 MG PO TABS
50.0000 mg | ORAL_TABLET | Freq: Four times a day (QID) | ORAL | Status: DC
  Administered 2022-11-20 – 2022-11-21 (×2): 50 mg via ORAL

## 2022-11-20 MED ORDER — PROPOFOL 10 MG/ML IV BOLUS
INTRAVENOUS | Status: DC | PRN
  Administered 2022-11-20: 20 mg via INTRAVENOUS

## 2022-11-20 MED ORDER — HYDROMORPHONE HCL 1 MG/ML IJ SOLN: 0.5000 mg | INTRAMUSCULAR | Status: DC | PRN

## 2022-11-20 MED ORDER — CEFAZOLIN SODIUM-DEXTROSE 2-4 GM/100ML-% IV SOLN: 2.0000 g | Freq: Four times a day (QID) | INTRAVENOUS | Status: AC

## 2022-11-20 MED ORDER — CEFAZOLIN SODIUM-DEXTROSE 2-4 GM/100ML-% IV SOLN
INTRAVENOUS | Status: AC
  Administered 2022-11-20: 2 g via INTRAVENOUS
  Filled 2022-11-20: qty 100

## 2022-11-20 MED ORDER — SODIUM CHLORIDE 0.9 % IV SOLN: INTRAVENOUS | Status: DC

## 2022-11-20 MED ORDER — ONDANSETRON HCL 4 MG/2ML IJ SOLN: 4.0000 mg | Freq: Four times a day (QID) | INTRAMUSCULAR | Status: DC | PRN

## 2022-11-20 MED ORDER — MIDAZOLAM HCL 5 MG/5ML IJ SOLN
INTRAMUSCULAR | Status: DC | PRN
  Administered 2022-11-20: 2 mg via INTRAVENOUS

## 2022-11-20 MED ORDER — ONDANSETRON HCL 4 MG/2ML IJ SOLN
INTRAMUSCULAR | Status: DC | PRN
  Administered 2022-11-20: 4 mg via INTRAVENOUS

## 2022-11-20 MED ORDER — NEOMYCIN-POLYMYXIN B GU 40-200000 IR SOLN
Status: AC
  Filled 2022-11-20: qty 20

## 2022-11-20 MED ORDER — CHLORHEXIDINE GLUCONATE 0.12 % MT SOLN
OROMUCOSAL | Status: AC
  Filled 2022-11-20: qty 15

## 2022-11-20 MED ORDER — METHOCARBAMOL 500 MG PO TABS
500.0000 mg | ORAL_TABLET | Freq: Four times a day (QID) | ORAL | Status: DC | PRN
  Administered 2022-11-21: 500 mg via ORAL

## 2022-11-20 MED ORDER — ONDANSETRON HCL 4 MG PO TABS: 4.0000 mg | ORAL_TABLET | Freq: Four times a day (QID) | ORAL | Status: DC | PRN

## 2022-11-20 MED ORDER — MIDAZOLAM HCL 2 MG/2ML IJ SOLN
INTRAMUSCULAR | Status: AC
  Filled 2022-11-20: qty 2

## 2022-11-20 MED ORDER — ONDANSETRON HCL 4 MG/2ML IJ SOLN
INTRAMUSCULAR | Status: AC
  Filled 2022-11-20: qty 2

## 2022-11-20 MED ORDER — OXYCODONE HCL 5 MG PO TABS
5.0000 mg | ORAL_TABLET | ORAL | Status: DC | PRN
  Administered 2022-11-20: 10 mg via ORAL
  Administered 2022-11-21: 5 mg via ORAL

## 2022-11-20 MED ORDER — PROPOFOL 1000 MG/100ML IV EMUL
INTRAVENOUS | Status: AC
  Filled 2022-11-20: qty 100

## 2022-11-20 MED ORDER — BUPIVACAINE HCL (PF) 0.5 % IJ SOLN
INTRAMUSCULAR | Status: AC
  Filled 2022-11-20: qty 10

## 2022-11-20 MED ORDER — TRANEXAMIC ACID 1000 MG/10ML IV SOLN
INTRAVENOUS | Status: AC
  Filled 2022-11-20: qty 10

## 2022-11-20 MED ORDER — ORAL CARE MOUTH RINSE: 15.0000 mL | OROMUCOSAL | Status: DC | PRN

## 2022-11-20 MED ORDER — ACETAMINOPHEN 500 MG PO TABS
ORAL_TABLET | ORAL | Status: AC
  Filled 2022-11-20: qty 2

## 2022-11-20 MED ORDER — LIDOCAINE HCL (PF) 2 % IJ SOLN
INTRAMUSCULAR | Status: AC
  Filled 2022-11-20: qty 5

## 2022-11-20 MED ORDER — SODIUM CHLORIDE FLUSH 0.9 % IV SOLN
INTRAVENOUS | Status: AC
  Filled 2022-11-20: qty 40

## 2022-11-20 MED ORDER — OXYCODONE HCL 5 MG PO TABS: 5.0000 mg | ORAL_TABLET | Freq: Once | ORAL | Status: DC | PRN

## 2022-11-20 MED ORDER — TRAMADOL HCL 50 MG PO TABS
ORAL_TABLET | ORAL | Status: AC
  Filled 2022-11-20: qty 1

## 2022-11-20 MED ORDER — CEFAZOLIN SODIUM-DEXTROSE 2-4 GM/100ML-% IV SOLN
INTRAVENOUS | Status: AC
  Administered 2022-11-21: 2 g via INTRAVENOUS
  Filled 2022-11-20: qty 100

## 2022-11-20 MED ORDER — KETOROLAC TROMETHAMINE 30 MG/ML IJ SOLN
INTRAMUSCULAR | Status: DC | PRN
  Administered 2022-11-20: 30 mg via INTRAVENOUS

## 2022-11-20 MED ORDER — LIDOCAINE HCL (CARDIAC) PF 100 MG/5ML IV SOSY
PREFILLED_SYRINGE | INTRAVENOUS | Status: DC | PRN
  Administered 2022-11-20: 50 mg via INTRAVENOUS

## 2022-11-20 MED ORDER — KETOROLAC TROMETHAMINE 30 MG/ML IJ SOLN
INTRAMUSCULAR | Status: AC
  Filled 2022-11-20: qty 1

## 2022-11-20 MED ORDER — PROPOFOL 10 MG/ML IV BOLUS
INTRAVENOUS | Status: AC
  Filled 2022-11-20: qty 20

## 2022-11-20 SURGICAL SUPPLY — 73 items
BLADE SAW 90X13X1.19 OSCILLAT (BLADE) ×1 IMPLANT
BLADE SAW 90X25X1.19 OSCILLAT (BLADE) ×1 IMPLANT
CEMENT HV SMART SET (Cement) ×2 IMPLANT
CEMENT TIBIA MBT SIZE 4 (Knees) IMPLANT
CNTNR URN SCR LID CUP LEK RST (MISCELLANEOUS) ×1 IMPLANT
CONT SPEC 4OZ STRL OR WHT (MISCELLANEOUS) ×1
COOLER POLAR GLACIER W/PUMP (MISCELLANEOUS) ×1 IMPLANT
CUFF TOURN SGL QUICK 24 (TOURNIQUET CUFF)
CUFF TOURN SGL QUICK 34 (TOURNIQUET CUFF)
CUFF TRNQT CYL 24X4X16.5-23 (TOURNIQUET CUFF) IMPLANT
CUFF TRNQT CYL 34X4.125X (TOURNIQUET CUFF) IMPLANT
DRAPE 3/4 80X56 (DRAPES) ×2 IMPLANT
DRAPE IMP U-DRAPE 54X76 (DRAPES) ×2 IMPLANT
DRAPE INCISE IOBAN 66X60 STRL (DRAPES) ×1 IMPLANT
DRAPE SURG 17X11 SM STRL (DRAPES) ×2 IMPLANT
DRSG AQUACEL AG ADV 3.5X10 (GAUZE/BANDAGES/DRESSINGS) ×1 IMPLANT
DRSG MEPILEX SACRM 8.7X9.8 (GAUZE/BANDAGES/DRESSINGS) ×1 IMPLANT
DURAPREP 26ML APPLICATOR (WOUND CARE) ×4 IMPLANT
ELECT REM PT RETURN 9FT ADLT (ELECTROSURGICAL) ×1
ELECTRODE REM PT RTRN 9FT ADLT (ELECTROSURGICAL) ×1 IMPLANT
FEMUR SIGMA PS SZ 4.0 R (Femur) IMPLANT
GAUZE SPONGE 4X4 12PLY STRL (GAUZE/BANDAGES/DRESSINGS) ×1 IMPLANT
GLOVE BIOGEL PI IND STRL 9 (GLOVE) ×2 IMPLANT
GLOVE BIOGEL PI ORTHO SZ9 (GLOVE) ×8 IMPLANT
GLOVE SURG SYN 7.5  E (GLOVE) ×1
GLOVE SURG SYN 7.5 E (GLOVE) ×1 IMPLANT
GLOVE SURG SYN 7.5 PF PI (GLOVE) ×1 IMPLANT
GLOVE SURG UNDER POLY LF SZ7.5 (GLOVE) ×1 IMPLANT
GOWN STRL REUS TWL 2XL XL LVL4 (GOWN DISPOSABLE) ×1 IMPLANT
GOWN STRL REUS W/ TWL LRG LVL3 (GOWN DISPOSABLE) ×1 IMPLANT
GOWN STRL REUS W/ TWL LRG LVL4 (GOWN DISPOSABLE) ×1 IMPLANT
GOWN STRL REUS W/ TWL XL LVL3 (GOWN DISPOSABLE) ×1 IMPLANT
GOWN STRL REUS W/TWL 2XL LVL3 (GOWN DISPOSABLE) ×1 IMPLANT
GOWN STRL REUS W/TWL LRG LVL3 (GOWN DISPOSABLE) ×1
GOWN STRL REUS W/TWL LRG LVL4 (GOWN DISPOSABLE) ×1
GOWN STRL REUS W/TWL XL LVL3 (GOWN DISPOSABLE) ×1
HOLDER FOLEY CATH W/STRAP (MISCELLANEOUS) ×1 IMPLANT
IMMBOLIZER KNEE 19 BLUE UNIV (SOFTGOODS) ×1 IMPLANT
IV NS IRRIG 3000ML ARTHROMATIC (IV SOLUTION) ×1 IMPLANT
KIT TURNOVER KIT A (KITS) ×1 IMPLANT
MANIFOLD NEPTUNE II (INSTRUMENTS) ×2 IMPLANT
NDL SAFETY ECLIP 18X1.5 (MISCELLANEOUS) ×1 IMPLANT
NDL SPNL 20GX3.5 QUINCKE YW (NEEDLE) ×1 IMPLANT
NEEDLE HYPO 22GX1.5 SAFETY (NEEDLE) ×1 IMPLANT
NEEDLE SPNL 20GX3.5 QUINCKE YW (NEEDLE) ×1 IMPLANT
NS IRRIG 1000ML POUR BTL (IV SOLUTION) ×1 IMPLANT
PACK TOTAL KNEE (MISCELLANEOUS) ×1 IMPLANT
PAD ABD DERMACEA PRESS 5X9 (GAUZE/BANDAGES/DRESSINGS) ×2 IMPLANT
PAD WRAPON POLAR KNEE (MISCELLANEOUS) ×1 IMPLANT
PATELLA DOME PFC 38MM (Knees) IMPLANT
PENCIL SMOKE EVACUATOR COATED (MISCELLANEOUS) ×1 IMPLANT
PIN DRILL FIX HALF THREAD (BIT) ×2 IMPLANT
PIN DRILL QUICK PACK ×2 IMPLANT
PIN FIXATION 1/8DIA X 3INL (PIN) ×2 IMPLANT
PLATE ROT INSERT 10MM SIZE 4 (Plate) IMPLANT
PULSAVAC PLUS IRRIG FAN TIP (DISPOSABLE) ×1
STAPLER SKIN PROX 35W (STAPLE) ×1 IMPLANT
SUCTION FRAZIER HANDLE 10FR (MISCELLANEOUS) ×1
SUCTION TUBE FRAZIER 10FR DISP (MISCELLANEOUS) ×1 IMPLANT
SUT ETHIBOND 0 (SUTURE) IMPLANT
SUT ETHIBOND NAB CT1 #1 30IN (SUTURE) ×2 IMPLANT
SUT VIC AB 0 CT1 36 (SUTURE) ×1 IMPLANT
SUT VIC AB 2-0 CT1 (SUTURE) ×2 IMPLANT
SYR 20ML LL LF (SYRINGE) ×1 IMPLANT
SYR 30ML LL (SYRINGE) ×2 IMPLANT
TIBIA MBT CEMENT SIZE 4 (Knees) ×1 IMPLANT
TIP FAN IRRIG PULSAVAC PLUS (DISPOSABLE) ×1 IMPLANT
TOWER CARTRIDGE SMART MIX (DISPOSABLE) ×1 IMPLANT
TRAP FLUID SMOKE EVACUATOR (MISCELLANEOUS) ×1 IMPLANT
TRAY FOLEY MTR SLVR 16FR STAT (SET/KITS/TRAYS/PACK) ×1 IMPLANT
TUBE SUCT KAM VAC (TUBING) ×1 IMPLANT
WATER STERILE IRR 500ML POUR (IV SOLUTION) ×1 IMPLANT
WRAPON POLAR PAD KNEE (MISCELLANEOUS) ×1

## 2022-11-20 NOTE — Anesthesia Preprocedure Evaluation (Signed)
Anesthesia Evaluation  Patient identified by MRN, date of birth, ID band Patient awake    Reviewed: Allergy & Precautions, NPO status , Patient's Chart, lab work & pertinent test results  History of Anesthesia Complications Negative for: history of anesthetic complications  Airway Mallampati: II  TM Distance: >3 FB Neck ROM: Full    Dental  (+) Teeth Intact, Implants   Pulmonary neg pulmonary ROS, neg sleep apnea, neg COPD, Patient abstained from smoking.Not current smoker   Pulmonary exam normal breath sounds clear to auscultation       Cardiovascular Exercise Tolerance: Good METS(-) hypertension(-) CAD and (-) Past MI negative cardio ROS (-) dysrhythmias  Rhythm:Regular Rate:Normal - Systolic murmurs    Neuro/Psych negative neurological ROS  negative psych ROS   GI/Hepatic ,GERD  Medicated and Controlled,,(+)     (-) substance abuse    Endo/Other  diabetes, Well Controlled, Type 2Hypothyroidism  On GLP1 injections, held for over a week. Denies GI symptoms today  Renal/GU negative Renal ROS     Musculoskeletal  (+) Arthritis ,    Abdominal   Peds  Hematology   Anesthesia Other Findings Past Medical History: 09/2019: COVID-19 virus infection No date: Diabetes type 2, controlled ([place]) No date: Erectile dysfunction No date: GERD (gastroesophageal reflux disease) No date: History of chicken pox No date: History of colon polyps     Comment:  benign No date: History of kidney stones No date: HLD (hyperlipidemia) No date: Hypothyroid No date: Left anterior fascicular block No date: Osteoarthritis of right knee 12/07/2013: Peripheral neuropathy     Comment:  Thyroid related neuropathy 1961: Undescended left testicle     Comment:  after trauma with dodgeball  Reproductive/Obstetrics                             Anesthesia Physical Anesthesia Plan  ASA: 2  Anesthesia Plan: Spinal    Post-op Pain Management: Tylenol PO (pre-op)*   Induction: Intravenous  PONV Risk Score and Plan: 1 and Ondansetron, Dexamethasone, Propofol infusion, TIVA and Midazolam  Airway Management Planned: Natural Airway  Additional Equipment: None  Intra-op Plan:   Post-operative Plan:   Informed Consent: I have reviewed the patients History and Physical, chart, labs and discussed the procedure including the risks, benefits and alternatives for the proposed anesthesia with the patient or authorized representative who has indicated his/her understanding and acceptance.       Plan Discussed with: CRNA and Surgeon  Anesthesia Plan Comments: (Discussed R/B/A of neuraxial anesthesia technique with patient: - rare risks of spinal/epidural hematoma, nerve damage, infection - Risk of PDPH - Risk of nausea and vomiting - Risk of conversion to general anesthesia and its associated risks, including sore throat, damage to lips/eyes/teeth/oropharynx, and rare risks such as cardiac and respiratory events. - Risk of allergic reactions  Discussed the role of CRNA in patient's perioperative care.  Patient voiced understanding.)       Anesthesia Quick Evaluation

## 2022-11-20 NOTE — Progress Notes (Signed)
PT Cancellation Note  Patient Details Name: Kevin Sheppard. MRN: [id number] DOB: [birth year]   Cancelled Treatment:    Reason Eval/Treat Not Completed: Medical issues which prohibited therapy Pt still with no ankle AROM, little sensation at this time.  Will initiate PT AM POD1.    Kreg Shropshire, DPT 11/20/2022, 5:57 PM

## 2022-11-20 NOTE — Anesthesia Procedure Notes (Signed)
Spinal  Start time: 11/20/2022 12:30 PM End time: 11/20/2022 12:34 PM Reason for block: surgical anesthesia Staffing Performed: resident/CRNA  Performed by: Levin Erp, CRNA Authorized by: Arita Miss, MD   Preanesthetic Checklist Completed: patient identified, IV checked, site marked, risks and benefits discussed, surgical consent, monitors and equipment checked, pre-op evaluation and timeout performed Spinal Block Patient position: sitting Prep: ChloraPrep Patient monitoring: heart rate, continuous pulse ox and blood pressure Approach: midline Location: L4-5 Injection technique: single-shot Needle Needle type: Pencil-Tip  Needle gauge: 24 G Needle length: 10 cm Needle insertion depth: 6 cm Assessment Sensory level: T8 Additional Notes Braun spinal needle tray lot [id number]  exp 10-15-2024

## 2022-11-20 NOTE — H&P (Signed)
PREOPERATIVE H&P  Chief Complaint: Right Knee Osteoarthritis  HPI: Kevin Safko. is a 72 y.o. male who presents for preoperative history and physical with a diagnosis of advanced right knee osteoarthritis. Symptoms of pain swelling and instability are significantly impairing activities of daily living.  He has failed nonoperative management wished to proceed with a right total knee arthroplasty.  Patient has radiographic findings of bone-on-bone contact in the medial compartment, subchondral sclerosis, and marginal osteophytes.  Past Medical History:  Diagnosis Date   COVID-19 virus infection 09/2019   Diabetes type 2, controlled ([place])    Erectile dysfunction    GERD (gastroesophageal reflux disease)    History of chicken pox    History of colon polyps    benign   History of kidney stones    HLD (hyperlipidemia)    Hypothyroid    Left anterior fascicular block    Osteoarthritis of right knee    Peripheral neuropathy 12/07/2013   Thyroid related neuropathy   Undescended left testicle 1961   after trauma with dodgeball   Past Surgical History:  Procedure Laterality Date   COLONOSCOPY  2004;2009   first with b9 polyps, then diverticulosis no polyps, rpt 16yr ([place])   COLONOSCOPY WITH PROPOFOL N/A 08/30/2018   2 TA, 1 SSP, diverticulosis, rpt 5 yrs (Allen Norris Darren, MD)   MENISCUS REPAIR Left 2008   MENISCUS REPAIR Right 2002   POLYPECTOMY N/A 08/30/2018   Procedure: POLYPECTOMY;  Surgeon: WLucilla Lame MD;  Location: [facility]  Service: Endoscopy;  Laterality: N/A;   Social History   Socioeconomic History   Marital status: Married    Spouse name: ETerrence Dupont  Number of children: 4   Years of education: Not on file   Highest education level: Not on file  Occupational History   Occupation: lAdministrator, Civil Service Tobacco Use   Smoking status: Never   Smokeless tobacco: Never  Vaping Use   Vaping Use: Never used  Substance and Sexual Activity   Alcohol use: Not Currently     Comment: occassional   Drug use: No   Sexual activity: Yes  Other Topics Concern   Not on file  Social History Narrative   He lives with wife.  2 dogs. They have grown 4 children and 3 grandchildren.    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   Activity: goes to gym regularly   Diet: good water, fruits/vegetables daily   Social Determinants of HRadio broadcast assistantStrain: Not on fArt therapistInsecurity: Not on file  Transportation Needs: Not on file  Physical Activity: Not on file  Stress: Not on file  Social Connections: Not on file   Family History  Problem Relation Age of Onset   Stroke Father    Diabetes Father    Other Mother        blood disease, died 361(?porphyria)   Diabetes type II Brother    Diabetes type II Maternal Aunt    Hypertension Sister    Stroke Sister 556  Thyroid disease Sister    Cancer Neg Hx    CAD Neg Hx    Allergies  Allergen Reactions   Lyrica [Pregabalin] Rash   Prior to Admission medications   Medication Sig Start Date End Date Taking? Authorizing Provider  atorvastatin (LIPITOR) 20 MG tablet TAKE 1 TABLET DAILY 02/28/22  Yes GRia Bush MD  Blood Glucose Monitoring Suppl (FREESTYLE LITE) w/Device KIT Use to check  sugars daily and as needed E11.69 10/14/22  Yes Ria Bush, MD  glucose blood (FREESTYLE LITE) test strip Use as instructed to check blood sugar 3 times a day 10/16/22  Yes Ria Bush, MD  levothyroxine (SYNTHROID) 100 MCG tablet Take 1 tablet (100 mcg total) by mouth daily with breakfast. 11/04/22  Yes Ria Bush, MD  metFORMIN (GLUCOPHAGE) 1000 MG tablet TAKE 1 TABLET TWICE A DAY WITH MEALS 02/28/22  Yes Ria Bush, MD  Multiple Vitamins-Minerals (MULTIVITAMIN ADULTS 50+ PO) Take 1 tablet by mouth daily.   Yes [provider]  omeprazole (PRILOSEC) 40 MG capsule Take 1 capsule (40 mg total) by mouth daily as needed. 11/17/22  Yes Ria Bush, MD   tadalafil (CIALIS) 10 MG tablet TAKE ONE TABLET BY MOUTH EVERY OTHER DAY AS NEEDED FOR ERECTILE DYSFUNCTION 11/03/22  Yes Ria Bush, MD  zolpidem (AMBIEN) 10 MG tablet TAKE 1 TABLET(10 MG) BY MOUTH AT BEDTIME AS NEEDED FOR SLEEP 11/13/22  Yes Ria Bush, MD  glipiZIDE (GLUCOTROL XL) 5 MG 24 hr tablet Take 1 tablet (5 mg total) by mouth daily with breakfast. 10/22/22   Ria Bush, MD  meloxicam (MOBIC) 7.5 MG tablet Take 7.5 mg by mouth daily as needed for pain.    [provider]  Semaglutide, 1 MG/DOSE, 4 MG/3ML SOPN Inject 1 mg as directed once a week. Patient taking differently: Inject 1 mg as directed once a week. Tuesday 09/05/22   Ria Bush, MD     Positive ROS: All other systems have been reviewed and were otherwise negative with the exception of those mentioned in the HPI and as above.  Physical Exam: General: Alert, no acute distress Cardiovascular: Regular rate and rhythm, no murmurs rubs or gallops.  No pedal edema Respiratory: Clear to auscultation bilaterally, no wheezes rales or rhonchi. No cyanosis, no use of accessory musculature GI: No organomegaly, abdomen is soft and non-tender nondistended with positive bowel sounds. Skin: Skin intact, no lesions within the operative field. Neurologic: Sensation intact distally Psychiatric: Patient is competent for consent with normal mood and affect Lymphatic: No cervical lymphadenopathy  MUSCULOSKELETAL: Right knee: Patient's skin is intact.  There is no large effusion today.  His range of motion is 0 to 120 degrees.  He has varus deformity to the right knee.  He has tenderness over the medial joint line.  He has patellofemoral crepitus and grind but no apprehension.  He has no ligamentous laxity.  Distally, he is neurovascular intact.  Assessment: Right Knee Osteoarthritis  Plan: Plan for Procedure(s): RIGHT TOTAL KNEE ARTHROPLASTY  I saw the patient in the preoperative area today.  I reviewed  the details of the operation as well as the postoperative course.  Patient will stay overnight in the hospital to receive 24 hours of postop antibiotics, pain control and physical therapy.  I marked the right knee according to hospital's correct site of surgery protocol.  A preoperative history and physical was performed at the bedside.  I discussed the risks and benefits of surgery. The risks include but are not limited to infection, bleeding requiring blood transfusion, nerve or blood vessel injury, joint stiffness or loss of motion, persistent pain, weakness or instability, fracture, dislocation, loosening or failure of the hardware and the need for further surgery. Patient understood these risks and wished to proceed.     Thornton Park, MD   11/20/2022 12:04 PM

## 2022-11-20 NOTE — Op Note (Signed)
DATE OF SURGERY:  11/20/2022 TIME: 3:46 PM  PATIENT NAME:  Kevin Sheppard.   AGE: 72 y.o.    PRE-OPERATIVE DIAGNOSIS:  Right Knee Osteoarthritis  POST-OPERATIVE DIAGNOSIS:  Same  PROCEDURE:  Procedure(s): RIGHT TOTAL KNEE ARTHROPLASTY  SURGEON:  Thornton Park, MD   ASSISTANT:  Danae Orleans, PA  OPERATIVE IMPLANTS: Depuy PFC Sigma, Posterior Stabilized Femural component size 4, Tibia size rotating platform component size 4, Patella polyethylene 3-peg oval button size 38, with a 10 mm polyethylene insert.  EBL:  50 cc  TOURNIQUET TIME:  100 minutes  PREOPERATIVE INDICATIONS:  Kevin Sheppard. is an 72 y.o. male who has a diagnosis of  Right Knee Osteoarthritis and elected for a right total knee arthroplasty after failing nonoperative treatment.  Their knee pain significantly impacts their activity of daily living.  Radiographs have demonstrated tricompartmental osteoarthritis joint space narrowing, osteophytes and subchondral sclerosis.  The risks, benefits, and alternatives were discussed at length including but not limited to the risks of infection, bleeding, nerve or blood vessel injury, knee stiffness, fracture, dislocation, loosening or failure of the hardware and the need for further surgery. Medical risks include but not limited to DVT and pulmonary embolism, myocardial infarction, stroke, pneumonia, respiratory failure and death. I discussed these risks with the patient in my office prior to the date of surgery. They understood these risks and were willing to proceed.  OPERATIVE FINDINGS AND UNIQUE ASPECTS OF THE CASE: Advanced tricompartmental osteoarthritis of the right knee  OPERATIVE DESCRIPTION:  The patient was brought to the operative room and placed in a supine position after undergoing placement of a spinal anesthetic.  A Foley catheter was placed.  IV antibiotics were given. Patient received 2 g of Ancef and 1000 mg of tranexamic acid prior to the inflation of the  tourniquet. The lower extremity was prepped and draped in the usual sterile fashion.  A time out was performed to verify the patient's name, date of birth, medical record number, correct site of surgery and correct procedure to be performed. The timeout was also used to confirm the patient received antibiotics and that appropriate instruments, implants and radiographs studies were available in the room.  The leg was elevated and exsanguinated with an Esmarch and the tourniquet was inflated to 275 mmHg for 100 minutes..  A midline incision was made over the right knee. Full-thickness skin flaps were developed. A medial parapatellar arthrotomy was then made and the patella everted and the knee was brought into 90 of flexion. Hoffa's fat pad along with the cruciate ligaments and medial and lateral menisci were resected.   The distal femoral intramedullary canal was opened with a drill and the intramedullary distal femoral cutting jig was inserted into the femoral canal pinned into position. It was set at 5 degrees resecting 10 mm off the distal femur.  Care was taken to protect the collateral ligaments during distal femoral resection.  The distal femoral resection was performed with an oscillating saw. The femoral cutting guide was then removed.  The extramedullary tibial cutting guide was then placed using the anterior tibial crest and second ray of the foot as a references.  The tibial cutting guide was adjusted to allow for appropriate posterior slope.  The tibial cutting block was pinned into position. The slotted stylus was used to measure the proximal tibial resection of 10 mm off the high lateral side.  The tibial long rod alignment guide was then used to confirm position of the cutting block. A  third cross pin through the tibial cutting block was then drilled into position to allow for rotational stability. Care was taken during the tibial resection to protect the medial and collateral ligaments.  The  resected tibial bone was removed along with the posterior horns of the menisci.  The PCL was sacrificed.  Extension gap was measured with a spacer block and alignment and extension was confirmed using a long alignment rod.  The attention was then turned back to the femur. The posterior referencing distal femoral sizing guide was applied to the distal femur.  The femur was sized to be a size 4. Rotation of the referencing guide was checked with the epicondylar axis and Whitesides line. Then the 4-in-1 cutting jig was then applied to the distal femur. A stylus was used to confirm that the anterior femur would not be notched.   Then the anterior, posterior and chamfer femoral cuts were then made with an oscillating saw.  The flexion gap was then measured with a flexion spacer block and long alignment rod and was found to be symmetric with the extension gap and perpendicular to mechanical axis of the tibia.  The distal femoral preparation was completed by performing the posterior stabilized box cut using the cutting block. The entry site for the intramedullary femoral guide was filled with autologous bone graft from bone previously resected earlier in the case.  The proximal tibia plateau was then sized with trial trays. The best coverage was achieved with a size 4. This tibial tray was then pinned into position. The proximal tibia was then prepared with the reamer and keel punch.  After tibial preparation was completed, all trial components were inserted with polyethylene trials.  The knee was found to have excellent balance and full motion with a size 4, 10 mm tibial polyethylene insert..    The attention was then turned to preparation of the patella. The thickness of the patella was measured with a caliper, the diameter measured with the patella templates.  The patella resection was then made with an oscillating saw using the patella cutting guide.  The final patellar diameter was 38 mm.  3 peg holes for the  patella component were then drilled. The trial patella was then placed. Knee was taken through a full range of motion and deemed to be stable with the trial components. All trial components were then removed. The knee capsule was then injected with Exparel. The joint was copiously irrigated with pulse lavage.  The final total knee arthroplasty components were then cemented into place with a 10 mm trial polyethylene insert and all excess methylmethacrylate was removed.  The joint was again copiously irrigated. After the cement had hardened the knee was again taken through a full range of motion. It was felt to be most stable with the 10 mm tibial polyethylene insert. The actual tibial polyethylene insert was then placed.   The knee was taken through a range of motion and the patella tracked well and the knee was again irrigated copiously.    The medial arthrotomy was closed with #1 Ethibond. The subcutaneous tissue closed with 0 and 2-0 vicryl, and skin approximated with staples.  A dry sterile and compressive dressing was applied.  A Polar Care was applied to the operative knee along with a knee immobilizer.  The patient was awakened and brought to the PACU in stable and satisfactory condition.  All sharp, lap and instrument counts were correct at the conclusion the case. I spoke with the patient's  wife by phone from the PACU to let her know the case had been performed without complication and the patient was stable in recovery room.

## 2022-11-20 NOTE — Transfer of Care (Signed)
Immediate Anesthesia Transfer of Care Note  Patient: Kevin Sheppard.  Procedure(s) Performed: TOTAL KNEE ARTHROPLASTY (Right: Knee)  Patient Location: PACU  Anesthesia Type:Spinal  Level of Consciousness: awake  Airway & Oxygen Therapy: Patient Spontanous Breathing  Post-op Assessment: Report given to RN and Post -op Vital signs reviewed and stable  Post vital signs: Reviewed and stable  Last Vitals:  Vitals Value Taken Time  BP 98/68 11/20/22 1539  Temp 36.1 C 11/20/22 1539  Pulse 61 11/20/22 1542  Resp 24 11/20/22 1542  SpO2 96 % 11/20/22 1542  Vitals shown include unvalidated device data.  Last Pain:  Vitals:   11/20/22 1019  PainSc: 0-No pain      Patients Stated Pain Goal: 0 ([id number])  Complications: No notable events documented.

## 2022-11-21 ENCOUNTER — Encounter: Payer: Self-pay | Admitting: Orthopedic Surgery

## 2022-11-21 DIAGNOSIS — Z7984 Long term (current) use of oral hypoglycemic drugs: Secondary | ICD-10-CM | POA: Diagnosis not present

## 2022-11-21 DIAGNOSIS — E119 Type 2 diabetes mellitus without complications: Secondary | ICD-10-CM | POA: Diagnosis not present

## 2022-11-21 DIAGNOSIS — E039 Hypothyroidism, unspecified: Secondary | ICD-10-CM | POA: Diagnosis not present

## 2022-11-21 DIAGNOSIS — Z79899 Other long term (current) drug therapy: Secondary | ICD-10-CM | POA: Diagnosis not present

## 2022-11-21 DIAGNOSIS — Z8616 Personal history of COVID-19: Secondary | ICD-10-CM | POA: Diagnosis not present

## 2022-11-21 DIAGNOSIS — R269 Unspecified abnormalities of gait and mobility: Secondary | ICD-10-CM | POA: Diagnosis not present

## 2022-11-21 DIAGNOSIS — M1711 Unilateral primary osteoarthritis, right knee: Secondary | ICD-10-CM | POA: Diagnosis not present

## 2022-11-21 LAB — GLUCOSE, CAPILLARY
Glucose-Capillary: 179 mg/dL — ABNORMAL HIGH (ref 70–99)
Glucose-Capillary: 252 mg/dL — ABNORMAL HIGH (ref 70–99)

## 2022-11-21 LAB — BASIC METABOLIC PANEL
Anion gap: 6 (ref 5–15)
BUN: 11 mg/dL (ref 8–23)
CO2: 23 mmol/L (ref 22–32)
Calcium: 8.6 mg/dL — ABNORMAL LOW (ref 8.9–10.3)
Chloride: 105 mmol/L (ref 98–111)
Creatinine, Ser: 0.53 mg/dL — ABNORMAL LOW (ref 0.61–1.24)
GFR, Estimated: >60 mL/min (ref >60)
Glucose, Bld: 200 mg/dL — ABNORMAL HIGH (ref 70–99)
Potassium: 3.9 mmol/L (ref 3.5–5.1)
Sodium: 134 mmol/L — ABNORMAL LOW (ref 135–145)

## 2022-11-21 LAB — CBC
HCT: 34.7 % — ABNORMAL LOW (ref 39.0–52.0)
Hemoglobin: 12 g/dL — ABNORMAL LOW (ref 13.0–17.0)
MCH: 32.8 pg (ref 26.0–34.0)
MCHC: 34.6 g/dL (ref 30.0–36.0)
MCV: 94.8 fL (ref 80.0–100.0)
Platelets: 154 10*3/uL (ref 150–400)
RBC: 3.66 MIL/uL — ABNORMAL LOW (ref 4.22–5.81)
RDW: 11.6 % (ref 11.5–15.5)
WBC: 7.5 10*3/uL (ref 4.0–10.5)
nRBC: 0 % (ref 0.0–0.2)

## 2022-11-21 MED ORDER — CEFAZOLIN SODIUM-DEXTROSE 2-4 GM/100ML-% IV SOLN
INTRAVENOUS | Status: AC
  Filled 2022-11-21: qty 100

## 2022-11-21 MED ORDER — DOCUSATE SODIUM 100 MG PO CAPS
ORAL_CAPSULE | ORAL | Status: AC
  Administered 2022-11-21: 100 mg via ORAL
  Filled 2022-11-21: qty 1

## 2022-11-21 MED ORDER — ATORVASTATIN CALCIUM 20 MG PO TABS
ORAL_TABLET | ORAL | Status: AC
  Administered 2022-11-21: 20 mg via ORAL
  Filled 2022-11-21: qty 1

## 2022-11-21 MED ORDER — PANTOPRAZOLE SODIUM 40 MG PO TBEC
DELAYED_RELEASE_TABLET | ORAL | Status: AC
  Administered 2022-11-21: 40 mg via ORAL
  Filled 2022-11-21: qty 1

## 2022-11-21 MED ORDER — ACETAMINOPHEN 500 MG PO TABS
ORAL_TABLET | ORAL | Status: AC
  Filled 2022-11-21: qty 2

## 2022-11-21 MED ORDER — BISACODYL 5 MG PO TBEC: 10.0000 mg | DELAYED_RELEASE_TABLET | Freq: Every day | ORAL | 0 refills | Status: DC | PRN

## 2022-11-21 MED ORDER — OXYCODONE HCL 5 MG PO TABS: 5.0000 mg | ORAL_TABLET | ORAL | 0 refills | Status: DC | PRN

## 2022-11-21 MED ORDER — DOCUSATE SODIUM 100 MG PO CAPS: 100.0000 mg | ORAL_CAPSULE | Freq: Two times a day (BID) | ORAL | 0 refills | Status: DC

## 2022-11-21 MED ORDER — TRAMADOL HCL 50 MG PO TABS
ORAL_TABLET | ORAL | Status: AC
  Administered 2022-11-21: 50 mg via ORAL
  Filled 2022-11-21: qty 1

## 2022-11-21 MED ORDER — INSULIN ASPART 100 UNIT/ML IJ SOLN: 0.0000 [IU] | Freq: Three times a day (TID) | INTRAMUSCULAR | Status: DC

## 2022-11-21 MED ORDER — OXYCODONE HCL 5 MG PO TABS
ORAL_TABLET | ORAL | Status: AC
  Filled 2022-11-21: qty 1

## 2022-11-21 MED ORDER — TRAMADOL HCL 50 MG PO TABS
ORAL_TABLET | ORAL | Status: AC
  Filled 2022-11-21: qty 1

## 2022-11-21 MED ORDER — ENOXAPARIN SODIUM 40 MG/0.4ML IJ SOSY
PREFILLED_SYRINGE | INTRAMUSCULAR | Status: AC
  Filled 2022-11-21: qty 0.4

## 2022-11-21 MED ORDER — INSULIN ASPART 100 UNIT/ML IJ SOLN: 0.0000 [IU] | Freq: Every day | INTRAMUSCULAR | Status: DC

## 2022-11-21 MED ORDER — METHOCARBAMOL 500 MG PO TABS
ORAL_TABLET | ORAL | Status: AC
  Filled 2022-11-21: qty 1

## 2022-11-21 MED ORDER — METFORMIN HCL 500 MG PO TABS
ORAL_TABLET | ORAL | Status: AC
  Filled 2022-11-21: qty 2

## 2022-11-21 MED ORDER — ENOXAPARIN SODIUM 40 MG/0.4ML IJ SOSY: 40.0000 mg | PREFILLED_SYRINGE | INTRAMUSCULAR | 0 refills | Status: DC

## 2022-11-21 MED ORDER — INSULIN ASPART 100 UNIT/ML IJ SOLN
INTRAMUSCULAR | Status: AC
  Filled 2022-11-21: qty 1

## 2022-11-21 MED ORDER — OXYCODONE HCL 5 MG PO TABS
ORAL_TABLET | ORAL | Status: AC
  Administered 2022-11-21: 5 mg via ORAL
  Filled 2022-11-21: qty 1

## 2022-11-21 NOTE — Evaluation (Signed)
Occupational Therapy Evaluation Patient Details Name: Kevin Sheppard. MRN: [id number] DOB: 11-01-1950 Today's Date: 11/21/2022   History of Present Illness Pt is a 72 year old male s/p R TKA 11/20/22   Clinical Impression   Pt seen for OT evaluation this date, POD#1 from above surgery. Pt is alert and oriented x4. PTA pt is indep in ADL/IADL. Pt is eager to return to PLOF with less pain and improved safety and independence. Pt provided education re: in polar care mgt, falls prevention strategies, home/routines modifications, DME/AE for LB bathing and dressing tasks. Pt would benefit from skilled OT services including additional instruction in dressing techniques with or without assistive devices for dressing and bathing skills to support recall and carryover prior to discharge and ultimately to maximize safety, independence, and minimize falls risk and caregiver burden. Do not currently anticipate any OT needs following this hospitalization.        Recommendations for follow up therapy are one component of a multi-disciplinary discharge planning process, led by the attending physician.  Recommendations may be updated based on patient status, additional functional criteria and insurance authorization.   Follow Up Recommendations  No OT follow up     Assistance Recommended at Discharge Set up Supervision/Assistance  Patient can return home with the following A little help with walking and/or transfers;A little help with bathing/dressing/bathroom;Help with stairs or ramp for entrance    Functional Status Assessment  Patient has had a recent decline in their functional status and demonstrates the ability to make significant improvements in function in a reasonable and predictable amount of time.  Equipment Recommendations  Other (comment) (2WW, pt reports he has bsc)    Recommendations for Other Services       Precautions / Restrictions Precautions Precautions: Fall;Knee Required Braces or  Orthoses: Knee Immobilizer - Right Restrictions Weight Bearing Restrictions: Yes RLE Weight Bearing: Weight bearing as tolerated      Mobility Bed Mobility Overal bed mobility: Needs Assistance Bed Mobility: Supine to Sit     Supine to sit: Supervision          Transfers Overall transfer level: Needs assistance Equipment used: Rolling walker (2 wheels) Transfers: Sit to/from Stand Sit to Stand: Supervision                  Balance Overall balance assessment: Needs assistance Sitting-balance support: Feet supported Sitting balance-Leahy Scale: Good     Standing balance support: Bilateral upper extremity supported, Reliant on assistive device for balance, During functional activity Standing balance-Leahy Scale: Good                             ADL either performed or assessed with clinical judgement   ADL Overall ADL's : Needs assistance/impaired Eating/Feeding: Set up;Sitting   Grooming: Wash/dry hands;Standing;Supervision/safety Grooming Details (indicate cue type and reason): sink level with RW         Upper Body Dressing : Set up;Sitting   Lower Body Dressing: Minimal assistance;Sit to/from stand Lower Body Dressing Details (indicate cue type and reason): shorts Toilet Transfer: Supervision/safety;Rolling walker (2 wheels);BSC/3in1 Toilet Transfer Details (indicate cue type and reason): intermittent vcs for technique Toileting- Clothing Manipulation and Hygiene: Supervision/safety;Sit to/from stand       Functional mobility during ADLs: Supervision/safety;Min guard;Rolling walker (2 wheels) (approx 52' with RW)       Vision Patient Visual Report: No change from baseline       Perception  Praxis      Pertinent Vitals/Pain Pain Assessment Pain Assessment: 0-10 Pain Score: 2  Pain Location: R knee Pain Descriptors / Indicators: Discomfort Pain Intervention(s): Limited activity within patient's tolerance, Monitored during  session, Premedicated before session, Repositioned, Patient requesting pain meds-RN notified     Hand Dominance     Extremity/Trunk Assessment Upper Extremity Assessment Upper Extremity Assessment: Overall WFL for tasks assessed   Lower Extremity Assessment Lower Extremity Assessment: RLE deficits/detail RLE Deficits / Details: s/p R TKA       Communication Communication Communication: No difficulties   Cognition Arousal/Alertness: Awake/alert Behavior During Therapy: WFL for tasks assessed/performed Overall Cognitive Status: Within Functional Limits for tasks assessed                                       General Comments  BP WNL after amb    Exercises     Shoulder Instructions      Home Living Family/patient expects to be discharged to:: Private residence Living Arrangements: Spouse/significant other Available Help at Discharge: Family;Available 24 hours/day Type of Home: House Home Access: Stairs to enter [employer] of Steps: 2 partial   Home Layout: One level     Bathroom Shower/Tub: Tub/shower unit;Walk-in shower         Home Equipment: BSC/3in1;Cane - single point;Grab bars - tub/shower;Shower seat          Prior Functioning/Environment Prior Level of Function : Independent/Modified Independent                        OT Problem List: Decreased strength;Decreased activity tolerance;Impaired balance (sitting and/or standing);Decreased knowledge of use of DME or AE      OT Treatment/Interventions: Self-care/ADL training;Balance training;Therapeutic exercise;Therapeutic activities;DME and/or AE instruction;Patient/family education    OT Goals(Current goals can be found in the care plan section) Acute Rehab OT Goals Patient Stated Goal: get back to swimming OT Goal Formulation: With patient Time For Goal Achievement: 12/05/22 Potential to Achieve Goals: Good ADL Goals Pt Will Perform Grooming: with modified  independence;standing Pt Will Perform Lower Body Dressing: with modified independence;sit to/from stand Pt Will Transfer to Toilet: with modified independence;ambulating Pt Will Perform Toileting - Clothing Manipulation and hygiene: with modified independence;sit to/from stand  OT Frequency: Min 2X/week    Co-evaluation              AM-PAC OT "6 Clicks" Daily Activity     Outcome Measure Help from another person eating meals?: None Help from another person taking care of personal grooming?: None Help from another person toileting, which includes using toliet, bedpan, or urinal?: None Help from another person bathing (including washing, rinsing, drying)?: A Little Help from another person to put on and taking off regular upper body clothing?: None Help from another person to put on and taking off regular lower body clothing?: A Little 6 Click Score: 22   End of Session Equipment Utilized During Treatment: Rolling walker (2 wheels) Nurse Communication: Mobility status;Patient requests pain meds  Activity Tolerance: Patient tolerated treatment well Patient left: in chair;with call bell/phone within reach  OT Visit Diagnosis: Other abnormalities of gait and mobility (R26.89)                Time: [id number] OT Time Calculation (min): 24 min Charges:  OT General Charges $OT Visit: 1 Visit OT Evaluation $OT Eval Low Complexity: 1  Low  Shanon Payor, OTD OTR/L  11/21/22, 1:37 PM

## 2022-11-21 NOTE — Evaluation (Signed)
Physical Therapy Evaluation Patient Details Name: Kevin Sheppard. MRN: [id number] DOB: [birth year] Today's Date: 11/21/2022  History of Present Illness  Pt is a 72 year old male s/p R TKA 11/20/22  Clinical Impression  Pt is up to recliner, did well with OT evaluation. Pain is 5/10, fairly intense about the upper anterolateral quads, tightness and discomfort inhibitory to activation of quads in session and passive stretch to quads. HEP education commenced, also started ed on polar care use and precautions. Pt ale to perform transfers and AMB with RW without physical assist but pain limits ability to follow cues for gait retraining. Pt remains motivated despite discomfort. At end of session, pt has greatest degree of knee flexion, but still quite limited, asked pt to leave feet down x15-20  minutes to ease into greater flexion angle, then elevate feet and continue with TKE stretch. Will return later in day.      Recommendations for follow up therapy are one component of a multi-disciplinary discharge planning process, led by the attending physician.  Recommendations may be updated based on patient status, additional functional criteria and insurance authorization.  Follow Up Recommendations Outpatient PT      Assistance Recommended at Discharge Intermittent Supervision/Assistance  Patient can return home with the following  A little help with walking and/or transfers;A lot of help with bathing/dressing/bathroom;Assistance with cooking/housework;Help with stairs or ramp for entrance;Assist for transportation;Direct supervision/assist for medications management    Equipment Recommendations Rolling walker (2 wheels)  Recommendations for Other Services       Functional Status Assessment Patient has had a recent decline in their functional status and demonstrates the ability to make significant improvements in function in a reasonable and predictable amount of time.     Precautions / Restrictions  Precautions Precautions: Fall;Knee Required Braces or Orthoses: Knee Immobilizer - Right Restrictions Weight Bearing Restrictions: Yes RLE Weight Bearing: Weight bearing as tolerated      Mobility  Bed Mobility               General bed mobility comments: already in recliner on entry    Transfers Overall transfer level: Needs assistance Equipment used: Rolling walker (2 wheels) Transfers: Sit to/from Stand Sit to Stand: Supervision                Ambulation/Gait Ambulation/Gait assistance: Supervision Gait Distance (Feet): 150 Feet Assistive device: Rolling walker (2 wheels) Gait Pattern/deviations: WFL(Within Functional Limits)       General Gait Details: heavy cues for gait retraining  Stairs            Wheelchair Mobility    Modified Rankin (Stroke Patients Only)       Balance                                             Pertinent Vitals/Pain Pain Assessment Pain Assessment: 0-10 Pain Score: 5  Pain Location: R knee Pain Descriptors / Indicators: Discomfort (tightness in upper quds)    Home Living Family/patient expects to be discharged to:: Private residence Living Arrangements: Spouse/significant other Available Help at Discharge: Family;Available 24 hours/day Type of Home: House Home Access: Stairs to enter   [employer] of Steps: 2 partial   Home Layout: One level Home Equipment: BSC/3in1;Cane - single point;Grab bars - tub/shower;Shower seat      Prior Function Prior Level of Function : Independent/Modified Independent  Hand Dominance        Extremity/Trunk Assessment   Upper Extremity Assessment Upper Extremity Assessment: Overall WFL for tasks assessed    Lower Extremity Assessment Lower Extremity Assessment: RLE deficits/detail RLE Deficits / Details: s/p R TKA       Communication   Communication: No difficulties  Cognition Arousal/Alertness:  Awake/alert Behavior During Therapy: WFL for tasks assessed/performed, Anxious Overall Cognitive Status: Within Functional Limits for tasks assessed                                          General Comments General comments (skin integrity, edema, etc.): BP WNL after amb    Exercises Total Joint Exercises Ankle Circles/Pumps: AROM, 10 reps, Supine, Right Quad Sets: AROM, 10 reps, Supine, Right Short Arc Quad: AAROM, Right, 10 reps, Supine Heel Slides: AAROM, Right, 10 reps, Supine, Limitations Heel Slides Limitations: self-assisted with blanket Hip ABduction/ADduction: AAROM, Right, 10 reps, Supine Straight Leg Raises: AROM, Right, Limitations, 10 reps, Supine Straight Leg Raises Limitations: once, assisted Goniometric ROM: 10-38 degree Rt knee flexion (not knee joint but quads tightness limitations at tourniquet site)   Assessment/Plan    PT Assessment Patient needs continued PT services  PT Problem List Decreased strength;Decreased range of motion;Decreased activity tolerance;Decreased balance;Decreased mobility       PT Treatment Interventions DME instruction;Gait training;Stair training;Functional mobility training;Therapeutic activities;Therapeutic exercise;Patient/family education    PT Goals (Current goals can be found in the Care Plan section)  Acute Rehab PT Goals Patient Stated Goal: be educated on how to rehab back to gym activity PT Goal Formulation: With patient Time For Goal Achievement: 12/05/22 Potential to Achieve Goals: Good    Frequency BID     Co-evaluation               AM-PAC PT "6 Clicks" Mobility  Outcome Measure Help needed turning from your back to your side while in a flat bed without using bedrails?: A Lot Help needed moving from lying on your back to sitting on the side of a flat bed without using bedrails?: A Lot Help needed moving to and from a bed to a chair (including a wheelchair)?: A Little Help needed standing  up from a chair using your arms (e.g., wheelchair or bedside chair)?: A Little Help needed to walk in hospital room?: A Little Help needed climbing 3-5 steps with a railing? : A Little 6 Click Score: 16    End of Session   Activity Tolerance: Patient tolerated treatment well;Patient limited by pain Patient left: in chair;with call bell/phone within reach;with nursing/sitter in room Nurse Communication: Mobility status;Weight bearing status PT Visit Diagnosis: Difficulty in walking, not elsewhere classified (R26.2);Other abnormalities of gait and mobility (R26.89)    Time: 1055-1130 PT Time Calculation (min) (ACUTE ONLY): 35 min   Charges:   PT Evaluation $PT Eval Moderate Complexity: 1 Mod PT Treatments $Therapeutic Exercise: 8-22 mins       2:40 PM, 11/21/22 Etta Grandchild, PT, DPT Physical Therapist - [facility]  [phone] ([place])    [place] C 11/21/2022, 2:35 PM

## 2022-11-21 NOTE — TOC Progression Note (Signed)
Transition of Care (TOC) - Progression Note    Patient Details  Name: Kevin Sheppard. MRN: [id number] Date of Birth: 03/26/1951  Transition of Care [facility]) CM/SW [place], RN Phone Number: 11/21/2022, 1:45 PM  Clinical Narrative:    The patient will go to Outpatient PT on 3/13, a Rolling walker will be delivered by [employer] to the bedside     Barriers to Discharge: No Barriers Identified  Expected Discharge Plan and Services   Discharge Planning Services: CM Consult   Living arrangements for the past 2 months: Single Family Home Expected Discharge Date: 11/21/22               DME Arranged: Gilford Rile rolling DME Agency: [employer] Date DME Agency Contacted: 11/21/22 Time DME Agency Contacted: [id number] Representative spoke with at DME Agency: [place] Determinants of Health ([place]) Interventions SDOH Screenings   Food Insecurity: No Food Insecurity (11/20/2022)  Housing: Low Risk  (11/20/2022)  Transportation Needs: No Transportation Needs (11/20/2022)  Utilities: Not At Risk (11/20/2022)  Depression (PHQ2-9): Low Risk  (10/14/2022)  Tobacco Use: Low Risk  (11/21/2022)    Readmission Risk Interventions     No data to display

## 2022-11-21 NOTE — Discharge Summary (Signed)
Physician Discharge Summary  Patient ID: Kevin Sheppard. MRN: [id number] DOB/AGE: Jan 19, 1951 72 y.o.  Admit date: 11/20/2022 Discharge date: 11/21/2022  Admission Diagnoses:  Right Knee Osteoarthritis S/P total knee arthroplasty, right  Discharge Diagnoses:  Right Knee Osteoarthritis Principal Problem:   S/P total knee arthroplasty, right   Past Medical History:  Diagnosis Date   COVID-19 virus infection 09/2019   Diabetes type 2, controlled ([place])    Erectile dysfunction    GERD (gastroesophageal reflux disease)    History of chicken pox    History of colon polyps    benign   History of kidney stones    HLD (hyperlipidemia)    Hypothyroid    Left anterior fascicular block    Osteoarthritis of right knee    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 is an 72 y.o. male who was admitted 11/20/2022 with a diagnosis of  Right Knee Osteoarthritis S/P total knee arthroplasty, right and went to the operating room on 11/20/2022 and underwent an uncomplicated right TKA.    He was given perioperative antibiotics:  Anti-infectives (From admission, onward)    Start     Dose/Rate Route Frequency Ordered Stop   11/21/22 0236  ceFAZolin (ANCEF) 2-4 GM/100ML-% IVPB       Note to Pharmacy: Sylvester Harder P: cabinet override      11/21/22 0236 11/21/22 1444   11/20/22 1900  ceFAZolin (ANCEF) IVPB 2g/100 mL premix        2 g 200 mL/hr over 30 Minutes Intravenous Every 6 hours 11/20/22 1559 11/21/22 0315   11/20/22 1030  ceFAZolin (ANCEF) IVPB 2g/100 mL premix        2 g 200 mL/hr over 30 Minutes Intravenous On call to O.R. 11/20/22 1021 11/20/22 1254   11/20/22 1008  ceFAZolin (ANCEF) 2-4 GM/100ML-% IVPB       Note to Pharmacy: Arlington Calix, [place]:  cabinet override      11/20/22 1008 11/21/22 0315     .  He was given sequential compression devices, early ambulation, and lovenox for DVT prophylaxis.  Patient was admitted postoperatively for 24 hours of IV antibiotics, pain control, physical therapy and neurovascular checks.  The patient did well postop and was ready for charge home on postop day 1.  He benefited maximally from the hospital stay and there were no complications.    Recent vital signs:  Vitals:   11/21/22 0647 11/21/22 1143  BP: 110/68 129/76  Pulse: 64 64  Resp: 18 16  Temp: 98 F (36.7 C) 98.1 F (36.7 C)  SpO2: 97% 97%    Recent laboratory studies:  Lab Results  Component Value Date   HGB 12.0 (L) 11/21/2022   HGB 14.5 11/07/2022   HGB 15.1 09/02/2022   Lab Results  Component Value Date   WBC 7.5 11/21/2022   PLT 154 11/21/2022   Lab Results  Component Value Date   INR 1.0 11/07/2022   Lab Results  Component Value Date   NA 134 (L) 11/21/2022   K 3.9 11/21/2022   CL 105 11/21/2022   CO2 23 11/21/2022   BUN 11 11/21/2022   CREATININE 0.53 (L) 11/21/2022   GLUCOSE 200 (H) 11/21/2022    Discharge Medications:   Allergies as of 11/21/2022  Reactions   Lyrica [pregabalin] Rash        Medication List     STOP taking these medications    meloxicam 7.5 MG tablet Commonly known as: MOBIC       TAKE these medications    atorvastatin 20 MG tablet Commonly known as: LIPITOR TAKE 1 TABLET DAILY   bisacodyl 5 MG EC tablet Commonly known as: DULCOLAX Take 2 tablets (10 mg total) by mouth daily as needed for moderate constipation.   docusate sodium 100 MG capsule Commonly known as: COLACE Take 1 capsule (100 mg total) by mouth 2 (two) times daily.   enoxaparin 40 MG/0.4ML injection Commonly known as: LOVENOX Inject 0.4 mLs (40 mg total) into the skin daily. Start taking on: November 22, 2022   FREESTYLE LITE test strip Generic drug: glucose blood Use as instructed to  check blood sugar 3 times a day   FreeStyle Lite w/Device Kit Use to check sugars daily and as needed E11.69   glipiZIDE 5 MG 24 hr tablet Commonly known as: GLUCOTROL XL Take 1 tablet (5 mg total) by mouth daily with breakfast.   levothyroxine 100 MCG tablet Commonly known as: SYNTHROID Take 1 tablet (100 mcg total) by mouth daily with breakfast.   metFORMIN 1000 MG tablet Commonly known as: GLUCOPHAGE TAKE 1 TABLET TWICE A DAY WITH MEALS   MULTIVITAMIN ADULTS 50+ PO Take 1 tablet by mouth daily.   omeprazole 40 MG capsule Commonly known as: PRILOSEC Take 1 capsule (40 mg total) by mouth daily as needed.   oxyCODONE 5 MG immediate release tablet Commonly known as: Oxy IR/ROXICODONE Take 1 tablet (5 mg total) by mouth every 4 (four) hours as needed for moderate pain (pain score 4-6).   Semaglutide (1 MG/DOSE) 4 MG/3ML Sopn Inject 1 mg as directed once a week. What changed: additional instructions   tadalafil 10 MG tablet Commonly known as: CIALIS TAKE ONE TABLET BY MOUTH EVERY OTHER DAY AS NEEDED FOR ERECTILE DYSFUNCTION   zolpidem 10 MG tablet Commonly known as: AMBIEN TAKE 1 TABLET(10 MG) BY MOUTH AT BEDTIME AS NEEDED FOR SLEEP        Diagnostic Studies: DG Knee Right Port  Result Date: 11/20/2022 CLINICAL DATA:  Status post right knee arthroplasty. Performed in PACU. EXAM: PORTABLE RIGHT KNEE - 1-2 VIEW COMPARISON:  Frontal view of the right knee 02/14/2021 FINDINGS: Interval total right knee arthroplasty. No perihardware lucency is seen to indicate hardware failure or loosening. Expected postoperative changes including small joint effusion and intra-articular and subcutaneous air. Anterior surgical skin staples. No acute fracture or dislocation. IMPRESSION: Interval total right knee arthroplasty without evidence of hardware failure. Electronically Signed   By: Yvonne Kendall M.D.   On: 11/20/2022 16:26    Disposition: Discharge disposition: 01-Home or Self  Care       Discharge Instructions     Call MD / Call 911   Complete by: As directed    If you experience chest pain or shortness of breath, CALL 911 and be transported to the hospital emergency room.  If you develope a fever above 101 F, pus (white drainage) or increased drainage or redness at the wound, or calf pain, call your surgeon's office.   Constipation Prevention   Complete by: As directed    Drink plenty of fluids.  Prune juice may be helpful.  You may use a stool softener, such as Colace (over the counter) 100 mg twice a day.  Use MiraLax (over the counter)  for constipation as needed.   Diet Carb Modified   Complete by: As directed    Discharge instructions   Complete by: As directed    Follow-up with Dr. Mack Guise in the office on 12-03-2022 @ 11:30 AM  First postoperative physical therapy visit at [facility] is on 11-26-2022 @ 11:00 AM, with Cherrie Gauze, PT  The patient may continue to bear weight on the right lower extremity with use of a walker. The patient should continue to use TED stockings until follow-up. Patient should remove the TED stockings at night for sleep. The patient needs to continue to elevate the right lower extremity whenever possible. The knee immobilizer should be used at night. The patient may remove the knee immobilizer to perform exercises or sit in a chair during the day.  Patient should not place a pillow under their knee. The Polar Care may be used by the patient for comfort.  The dressing should remain on until follow up in the office.   The patient must cover the right knee dressing/incision during showers with a plastic bag or Saran wrap.  The patient will take lovenox 40 mg daily a day for blood clot prevention and continue to work on knee range of motion exercises at home as instructed by physical therapy until follow-up in the office.   Driving restrictions   Complete by: As directed    No driving for 6-8 weeks   Increase activity slowly  as tolerated   Complete by: As directed    Lifting restrictions   Complete by: As directed    No lifting for 12-16 weeks   Post-operative opioid taper instructions:   Complete by: As directed    POST-OPERATIVE OPIOID TAPER INSTRUCTIONS: It is important to wean off of your opioid medication as soon as possible. If you do not need pain medication after your surgery it is ok to stop day one. Opioids include: Codeine, Hydrocodone(Norco, Vicodin), Oxycodone(Percocet, oxycontin) and hydromorphone amongst others.  Long term and even short term use of opiods can cause: Increased pain response Dependence Constipation Depression Respiratory depression And more.  Withdrawal symptoms can include Flu like symptoms Nausea, vomiting And more Techniques to manage these symptoms Hydrate well Eat regular healthy meals Stay active Use relaxation techniques(deep breathing, meditating, yoga) Do Not substitute Alcohol to help with tapering If you have been on opioids for less than two weeks and do not have pain than it is ok to stop all together.  Plan to wean off of opioids This plan should start within one week post op of your joint replacement. Maintain the same interval or time between taking each dose and first decrease the dose.  Cut the total daily intake of opioids by one tablet each day Next start to increase the time between doses. The last dose that should be eliminated is the evening dose.             Signed: Thornton Park ,MD 11/21/2022, 1:23 PM

## 2022-11-21 NOTE — Progress Notes (Signed)
Subjective:  POD #1 s/p right TKA.   Patient reports right pain as mild to moderate.  Patient up out of bed to a chair.  Objective:   VITALS:   Vitals:   11/20/22 2100 11/21/22 0244 11/21/22 0647 11/21/22 1143  BP: 109/71 105/72 110/68 129/76  Pulse: 60 64 64 64  Resp: '16 16 18 16  '$ Temp: (!) 97 F (36.1 C) 98.2 F (36.8 C) 98 F (36.7 C) 98.1 F (36.7 C)  TempSrc: Temporal   Temporal  SpO2: 96% 96% 97% 97%  Weight:      Height:        PHYSICAL EXAM: Right lower extremity: I removed the patient's outer dressings.  His Aquacel dressing has minimal drainage.  Patient has mild to moderate swelling of the right knee.  His compartments are soft and compressible and distally he is neurovascular intact.  He can dorsiflex and plantarflex his ankle and has intact sensation light touch in the right foot and lower leg with palpable pedal pulses.   LABS  Results for orders placed or performed during the hospital encounter of 11/20/22 (from the past 24 hour(s))  Glucose, capillary     Status: Abnormal   Collection Time: 11/20/22  3:40 PM  Result Value Ref Range   Glucose-Capillary 140 (H) 70 - 99 mg/dL  Glucose, capillary     Status: Abnormal   Collection Time: 11/20/22  6:11 PM  Result Value Ref Range   Glucose-Capillary 177 (H) 70 - 99 mg/dL  CBC     Status: Abnormal   Collection Time: 11/21/22  6:41 AM  Result Value Ref Range   WBC 7.5 4.0 - 10.5 K/uL   RBC 3.66 (L) 4.22 - 5.81 MIL/uL   Hemoglobin 12.0 (L) 13.0 - 17.0 g/dL   HCT 34.7 (L) 39.0 - 52.0 %   MCV 94.8 80.0 - 100.0 fL   MCH 32.8 26.0 - 34.0 pg   MCHC 34.6 30.0 - 36.0 g/dL   RDW 11.6 11.5 - 15.5 %   Platelets 154 150 - 400 K/uL   nRBC 0.0 0.0 - 0.2 %  Basic metabolic panel     Status: Abnormal   Collection Time: 11/21/22  6:41 AM  Result Value Ref Range   Sodium 134 (L) 135 - 145 mmol/L   Potassium 3.9 3.5 - 5.1 mmol/L   Chloride 105 98 - 111 mmol/L   CO2 23 22 - 32 mmol/L   Glucose, Bld 200 (H) 70 - 99  mg/dL   BUN 11 8 - 23 mg/dL   Creatinine, Ser 0.53 (L) 0.61 - 1.24 mg/dL   Calcium 8.6 (L) 8.9 - 10.3 mg/dL   GFR, Estimated >60 >60 mL/min   Anion gap 6 5 - 15  Glucose, capillary     Status: Abnormal   Collection Time: 11/21/22  7:27 AM  Result Value Ref Range   Glucose-Capillary 179 (H) 70 - 99 mg/dL   Comment 1 QC Due   Glucose, capillary     Status: Abnormal   Collection Time: 11/21/22 11:35 AM  Result Value Ref Range   Glucose-Capillary 252 (H) 70 - 99 mg/dL    DG Knee Right Port  Result Date: 11/20/2022 CLINICAL DATA:  Status post right knee arthroplasty. Performed in PACU. EXAM: PORTABLE RIGHT KNEE - 1-2 VIEW COMPARISON:  Frontal view of the right knee 02/14/2021 FINDINGS: Interval total right knee arthroplasty. No perihardware lucency is seen to indicate hardware failure or loosening. Expected postoperative changes including small joint  effusion and intra-articular and subcutaneous air. Anterior surgical skin staples. No acute fracture or dislocation. IMPRESSION: Interval total right knee arthroplasty without evidence of hardware failure. Electronically Signed   By: Yvonne Kendall M.D.   On: 11/20/2022 16:26    Assessment/Plan: 1 Day Post-Op   Principal Problem:   S/P total knee arthroplasty, right  Patient is stable postop.  Patient states he was able to ambulate with physical therapy today.  Patient would like to be discharged home today.  Patient may weight-bear as tolerated on the right lower extremity.  He was instructed to continue strict elevation of his right lower extremity at home and continue to use his Polar Care to reduce swelling.  He will work on knee range of motion exercises and will wear his knee immobilizer at night.  Patient will be discharged home with thigh-high teds hose to reduce swelling.  She will take Lovenox 40 mg daily for DVT prophylaxis or enteric-coated aspirin 325 mg p.o. twice daily if the Lovenox is too expensive.   She will follow-up with Dr.  Joen Laura in the office on 12-03-2022 11:30 AM.  His first physical therapy visit at [facility] be on 11-26-2022 @ 11:00 AM, Cherrie Gauze, PT.    [place] Park , MD 11/21/2022, 1:12 PM

## 2022-11-21 NOTE — Inpatient Diabetes Management (Signed)
Inpatient Diabetes Program Recommendations  AACE/ADA: New Consensus Statement on Inpatient Glycemic Control (2015)  Target Ranges:  Prepandial:   less than 140 mg/dL      Peak postprandial:   less than 180 mg/dL (1-2 hours)      Critically ill patients:  140 - 180 mg/dL   Lab Results  Component Value Date   GLUCAP 179 (H) 11/21/2022   HGBA1C 6.8 (H) 11/07/2022    Review of Glycemic Control  Diabetes history: DM2 Outpatient Diabetes medications: Glucotrol XL 5 mg qd, Metformin 1 gm bid Current orders for Inpatient glycemic control: Glucotrol 5 mg qd, metformin 1 gm bid  Inpatient Diabetes Program Recommendations:   Received consult regarding Novolog correction scale for patient having total knee surgery today. Patient has already received oral DM meds. Please consider: Novolog 0-9 units tid, 0-5 units hs correction when eating, q 4 hrs. While NPO.  Thank you, Nani Gasser. Arshiya Jakes, RN, MSN, CDE  Diabetes Coordinator Inpatient Glycemic Control Team Team Pager [phone] (8am-5pm) 11/21/2022 10:54 AM

## 2022-11-21 NOTE — Progress Notes (Signed)
Physical Therapy Treatment Patient Details Name: Kevin Sheppard. MRN: [id number] DOB: [birth year] Today's Date: 11/21/2022   History of Present Illness Pt is a 72 year old male s/p R TKA 11/20/22    PT Comments    Pt still in chair, wife left recently, lunch finished. Pt reports recently received pain meds, pain much improved compared to AM session, now 3-4/10, still focal to mid quads. Pt has not attempted HEP independently as recommended earlier by author- does report AMB to BR with NSG not long ago. Pt Able to demonstrate performance of entire HEP, less pain limitation, ROM somewhat better but still more limited than typical. Pt does need physical assistance with setup for 2-3 exercises, placement of polar care wrap, and application of KI- likely for ankle roll as well. Pt able to advance AMB distance and spend more focal time on gait retraining, more success with cues, but still has work to do. Pt able to AMB 4 stairs up 4 stairs down, then back to room- pit stop in restroom for voiding. Pt left up in chair with polar care, KI, and ankle roll. All needs met. TOC aware of RW need. Pt has completed all PT education and training, is prepared for DC to home from a PT standpoint. Pt reports his pain is controlled and he will likely elect to DC today rather than stay another overnight.   Recommendations for follow up therapy are one component of a multi-disciplinary discharge planning process, led by the attending physician.  Recommendations may be updated based on patient status, additional functional criteria and insurance authorization.  Follow Up Recommendations  Outpatient PT     Assistance Recommended at Discharge Intermittent Supervision/Assistance  Patient can return home with the following A little help with walking and/or transfers;A lot of help with bathing/dressing/bathroom;Assistance with cooking/housework;Help with stairs or ramp for entrance;Assist for transportation;Direct  supervision/assist for medications management   Equipment Recommendations  Rolling walker (2 wheels)    Recommendations for Other Services       Precautions / Restrictions Precautions Precautions: Fall;Knee Precaution Booklet Issued: Yes (comment) Required Braces or Orthoses: Knee Immobilizer - Right Restrictions Weight Bearing Restrictions: Yes RLE Weight Bearing: Weight bearing as tolerated     Mobility  Bed Mobility               General bed mobility comments: still in recliner on entry    Transfers Overall transfer level: Needs assistance Equipment used: Rolling walker (2 wheels) Transfers: Sit to/from Stand Sit to Stand: Modified independent (Device/Increase time)                Ambulation/Gait Ambulation/Gait assistance: Supervision Gait Distance (Feet): 260 Feet Assistive device: Rolling walker (2 wheels) Gait Pattern/deviations: Step-to pattern, Step-through pattern, Decreased step length - left, Antalgic Gait velocity: 0.6ms     General Gait Details: cues to equalize step length, then cues to DC antalgia, then cues to attempt 2 point v 3 point gait pattern   Stairs Stairs: Yes Stairs assistance: Min guard Stair Management: Two rails Number of Stairs: 4 General stair comments: cues and education for safe performance   Wheelchair Mobility    Modified Rankin (Stroke Patients Only)       Balance                                            Cognition Arousal/Alertness: Awake/alert Behavior  During Therapy: WFL for tasks assessed/performed, Anxious Overall Cognitive Status: Within Functional Limits for tasks assessed                                          Exercises Total Joint Exercises Ankle Circles/Pumps: AROM, 10 reps, Supine, Right Quad Sets: AROM, Right, 10 reps, Supine Short Arc Quad: Right, 10 reps, Supine, AROM, Limitations Short Arc Quad Limitations: need help placing and removing  roll Heel Slides: Right, 10 reps, Supine, Limitations, AROM Heel Slides Limitations: able to perform without self assist, but author recommends to maximize ROM as unassisted range was veyr limited ~35 degrees Hip ABduction/ADduction: Right, 10 reps, Supine, AROM, Limitations Hip Abduction/Adduction Limitations: leg placed on low-friction surface Straight Leg Raises: AROM, Right, 10 reps, Supine Straight Leg Raises Limitations: good control of knee joint Long Arc Quad: Right, 10 reps, Limitations Long Arc Quad Limitations: modified version performed due to pain/ROM limitations Knee Flexion: Right, AROM, Limitations Knee Flexion Limitations: modified version performed due to pain/ROM limitations Goniometric ROM: 12-74 degrees (able to perform seated hamstrings knee flexion hele slides x10)    General Comments General comments (skin integrity, edema, etc.): BP WNL after amb      Pertinent Vitals/Pain Pain Assessment Pain Assessment: 0-10 Pain Score: 4  Pain Location: R knee Pain Descriptors / Indicators: Discomfort Pain Intervention(s): Limited activity within patient's tolerance, Monitored during session, Premedicated before session, Repositioned    Home Living Family/patient expects to be discharged to:: Private residence Living Arrangements: Spouse/significant other Available Help at Discharge: Family;Available 24 hours/day Type of Home: House Home Access: Stairs to enter   [employer] of Steps: 2 partial   Home Layout: One level Home Equipment: BSC/3in1;Cane - single point;Grab bars - tub/shower;Shower seat      Prior Function            PT Goals (current goals can now be found in the care plan section) Acute Rehab PT Goals Patient Stated Goal: be educated on how to rehab back to gym activity PT Goal Formulation: With patient Time For Goal Achievement: 12/05/22 Potential to Achieve Goals: Good Progress towards PT goals: Goals met and updated - see care  plan    Frequency    BID      PT Plan Current plan remains appropriate    Co-evaluation              AM-PAC PT "6 Clicks" Mobility   Outcome Measure  Help needed turning from your back to your side while in a flat bed without using bedrails?: A Lot Help needed moving from lying on your back to sitting on the side of a flat bed without using bedrails?: A Lot Help needed moving to and from a bed to a chair (including a wheelchair)?: A Little Help needed standing up from a chair using your arms (e.g., wheelchair or bedside chair)?: A Little Help needed to walk in hospital room?: A Little Help needed climbing 3-5 steps with a railing? : A Little 6 Click Score: 16    End of Session Equipment Utilized During Treatment: Gait belt Activity Tolerance: Patient tolerated treatment well;No increased pain Patient left: in chair;with call bell/phone within reach;with nursing/sitter in room Nurse Communication: Mobility status;Weight bearing status PT Visit Diagnosis: Difficulty in walking, not elsewhere classified (R26.2);Other abnormalities of gait and mobility (R26.89)     Time: 1315-1400 PT Time Calculation (min) (  ACUTE ONLY): 45 min  Charges:  $Gait Training: 23-37 mins $Therapeutic Exercise: 8-22 mins                    2:54 PM, 11/21/22 Etta Grandchild, PT, DPT Physical Therapist - [facility]  [phone] ([place])    [place] 11/21/2022, 2:49 PM

## 2022-11-21 NOTE — Anesthesia Postprocedure Evaluation (Signed)
Anesthesia Post Note  Patient: Kevin Sheppard.  Procedure(s) Performed: TOTAL KNEE ARTHROPLASTY (Right: Knee)  Patient location during evaluation: Nursing Unit Anesthesia Type: Spinal Level of consciousness: oriented and awake and alert Pain management: pain level controlled Vital Signs Assessment: post-procedure vital signs reviewed and stable Respiratory status: spontaneous breathing and respiratory function stable Cardiovascular status: blood pressure returned to baseline and stable Postop Assessment: no headache, no backache, no apparent nausea or vomiting and patient able to bend at knees Anesthetic complications: no  No notable events documented.   Last Vitals:  Vitals:   11/21/22 0244 11/21/22 0647  BP: 105/72 110/68  Pulse: 64 64  Resp: 16 18  Temp: 36.8 C 36.7 C  SpO2: 96% 97%    Last Pain:  Vitals:   11/21/22 0834  TempSrc:   PainSc: 3                  Caryl Asp

## 2022-11-23 ENCOUNTER — Encounter: Payer: Self-pay | Admitting: Family Medicine

## 2022-11-24 NOTE — Telephone Encounter (Signed)
Patient went back on ozempic today, was told to wait until 3.12.24, but based on numbers he is started today  Patient states may respond to phone [phone] or respond via my chart

## 2022-11-25 NOTE — Telephone Encounter (Signed)
See other mychart note.

## 2022-11-26 DIAGNOSIS — M25561 Pain in right knee: Secondary | ICD-10-CM | POA: Diagnosis not present

## 2022-11-26 DIAGNOSIS — M25661 Stiffness of right knee, not elsewhere classified: Secondary | ICD-10-CM | POA: Diagnosis not present

## 2022-11-30 ENCOUNTER — Ambulatory Visit
Admission: RE | Admit: 2022-11-30 | Discharge: 2022-11-30 | Disposition: A | Discharge status: Home / Self Care | Payer: Medicare PPO | Source: Ambulatory Visit | Attending: Internal Medicine | Admitting: Internal Medicine

## 2022-11-30 VITALS — BP 113/82 | HR 85 | Temp 98.7°F | Resp 16

## 2022-11-30 DIAGNOSIS — L03115 Cellulitis of right lower limb: Secondary | ICD-10-CM | POA: Insufficient documentation

## 2022-11-30 DIAGNOSIS — R350 Frequency of micturition: Secondary | ICD-10-CM | POA: Insufficient documentation

## 2022-11-30 DIAGNOSIS — R7309 Other abnormal glucose: Secondary | ICD-10-CM | POA: Diagnosis not present

## 2022-11-30 LAB — POCT URINALYSIS DIP (MANUAL ENTRY)
Bilirubin, UA: NEGATIVE
Blood, UA: NEGATIVE
Glucose, UA: NEGATIVE mg/dL
Ketones, POC UA: NEGATIVE mg/dL
Leukocytes, UA: NEGATIVE
Nitrite, UA: NEGATIVE
Protein Ur, POC: NEGATIVE mg/dL
Spec Grav, UA: 1.02 (ref 1.010–1.025)
Urobilinogen, UA: 1 E.U./dL
pH, UA: 6.5 (ref 5.0–8.0)

## 2022-11-30 MED ORDER — CEPHALEXIN 500 MG PO CAPS: 500.0000 mg | ORAL_CAPSULE | Freq: Four times a day (QID) | ORAL | 0 refills | Status: DC

## 2022-11-30 NOTE — ED Triage Notes (Signed)
Patient presents to UC for urinary freq and dysuria since yesterday. Has not taken anything OTC.

## 2022-11-30 NOTE — Discharge Instructions (Addendum)
Follow up with your surgeon tomorrow Keep appointment with your PCP tomorrow, in case the hesitation persist and is your prostate that is enlarged.

## 2022-11-30 NOTE — ED Provider Notes (Signed)
[facility]    CSN: [id number] Arrival date & time: 11/30/22  1320      History   Chief Complaint Chief Complaint  Patient presents with   Urinary Frequency    Possible kidney infection - Entered by patient   Dysuria    HPI Kevin Flaugh. is a 72 y.o. male who is here with his wife with complains of urinary frequency day and night time x 2 days. Had R knee surgery 10 day ago and was cathed. His urine is cloudy with strong smell. Today has noticed mild stinging when voiding. His glucose have been elevated in the 280's  for the past 9 days and usually is in good control. Denies history of prostates problems and enlargement. Has had some hesitation at night time in the past 2 nights. When he voids is large amounts and denies dribbling. Denies fever or chills. Has been sweating at night time. Has post op appointment in 3 days.     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   Pre-op evaluation 09/02/2022   Onychomycosis of great toe 04/09/2022   Chest discomfort 02/14/2022   Medicare annual wellness visit, initial 01/08/2022   GERD (gastroesophageal reflux disease) 06/10/2021   LAFB (left anterior fascicular block) 11/13/2020   Hydronephrosis with obstructing calculus 02/23/2020   Advanced directives, counseling/discussion 10/12/2019   Personal history of colonic polyps     Polyp of sigmoid colon    Benign neoplasm of descending colon    Benign neoplasm of cecum    Right shoulder pain 08/09/2018   Left anterior knee pain 06/01/2017   Overweight with body mass index (BMI) 25.0-29.9 06/01/2017   Health maintenance examination 03/13/2015   Undescended left testicle    Benign skin lesion of nose 10/30/2014   Erectile dysfunction 10/30/2014   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)    Peripheral neuropathy 12/07/2013   Hypothyroidism 10/26/2013   Type 2 diabetes mellitus with other specified complication ([place]) 123456    Past Surgical History:  Procedure Laterality Date   COLONOSCOPY  2004;2009   first with b9 polyps, then diverticulosis no polyps, rpt 71yrs ([place])   COLONOSCOPY WITH PROPOFOL N/A 08/30/2018   2 TA, 1 SSP, diverticulosis, rpt 5 yrs Allen Norris, Evangeline Gula, MD)   MENISCUS REPAIR Left 2008   MENISCUS REPAIR Right 2002   POLYPECTOMY N/A 08/30/2018   Procedure: POLYPECTOMY;  Surgeon: Lucilla Lame, MD;  Location: [facility];  Service: Endoscopy;  Laterality: N/A;   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   Procedure: TOTAL KNEE ARTHROPLASTY;  Surgeon: Thornton Park, MD;  Location: [facility];  Service: Orthopedics;  Laterality: Right;       Home Medications    Prior to Admission medications   Medication  Sig Start Date End Date Taking? Authorizing Provider  cephALEXin (KEFLEX) 500 MG capsule Take 1 capsule (500 mg total) by mouth 4 (four) times daily. 11/30/22  Yes Rodriguez-Southworth, Sunday Spillers, PA-C  atorvastatin (LIPITOR) 20 MG tablet TAKE 1 TABLET DAILY 02/28/22   Ria Bush, MD  bisacodyl (DULCOLAX) 5 MG EC tablet Take 2 tablets (10 mg total) by mouth daily as needed for moderate constipation. 11/21/22   Thornton Park, MD  Blood Glucose Monitoring Suppl (FREESTYLE LITE) w/Device KIT Use to check sugars daily and as needed E11.69 10/14/22   Ria Bush, MD   docusate sodium (COLACE) 100 MG capsule Take 1 capsule (100 mg total) by mouth 2 (two) times daily. 11/21/22   Thornton Park, MD  enoxaparin (LOVENOX) 40 MG/0.4ML injection Inject 0.4 mLs (40 mg total) into the skin daily. 11/22/22 12/22/22  Thornton Park, MD  glipiZIDE (GLUCOTROL XL) 5 MG 24 hr tablet Take 1 tablet (5 mg total) by mouth daily with breakfast. 10/22/22   Ria Bush, MD  glucose blood (FREESTYLE LITE) test strip Use as instructed to check blood sugar 3 times a day 10/16/22   Ria Bush, MD  levothyroxine (SYNTHROID) 100 MCG tablet Take 1 tablet (100 mcg total) by mouth daily with breakfast. 11/04/22   Ria Bush, MD  metFORMIN (GLUCOPHAGE) 1000 MG tablet TAKE 1 TABLET TWICE A DAY WITH MEALS 02/28/22   Ria Bush, MD  Multiple Vitamins-Minerals (MULTIVITAMIN ADULTS 50+ PO) Take 1 tablet by mouth daily.    [provider]  omeprazole (PRILOSEC) 40 MG capsule Take 1 capsule (40 mg total) by mouth daily as needed. 11/17/22   Ria Bush, MD  oxyCODONE (OXY IR/ROXICODONE) 5 MG immediate release tablet Take 1 tablet (5 mg total) by mouth every 4 (four) hours as needed for moderate pain (pain score 4-6). 11/21/22   Thornton Park, MD  Semaglutide, 1 MG/DOSE, 4 MG/3ML SOPN Inject 1 mg as directed once a week. Patient taking differently: Inject 1 mg as directed once a week. Tuesday 09/05/22   Ria Bush, MD  tadalafil (CIALIS) 10 MG tablet TAKE ONE TABLET BY MOUTH EVERY OTHER DAY AS NEEDED FOR ERECTILE DYSFUNCTION 11/03/22   Ria Bush, MD  zolpidem (AMBIEN) 10 MG tablet TAKE 1 TABLET(10 MG) BY MOUTH AT BEDTIME AS NEEDED FOR SLEEP 11/13/22   Ria Bush, MD    Family History Family History  Problem Relation Age of Onset   Stroke Father    Diabetes Father    Other Mother        blood disease, died 42 (?porphyria)   Diabetes type II Brother    Diabetes type II Maternal Aunt    Hypertension Sister    Stroke Sister 79   Thyroid  disease Sister    Cancer Neg Hx    CAD Neg Hx     Social History Social History   Tobacco Use   Smoking status: Never   Smokeless tobacco: Never  Vaping Use   Vaping Use: Never used  Substance Use Topics   Alcohol use: Not Currently    Comment: occassional   Drug use: No     Allergies   Lyrica [pregabalin]   Review of Systems Review of Systems  Constitutional:  Positive for diaphoresis.  Respiratory:  Negative for cough.   Endocrine: Positive for polyuria. Negative for polydipsia and polyphagia.  Genitourinary:  Positive for frequency.  Musculoskeletal:  Positive for arthralgias, gait problem and joint swelling.  Skin:  Positive for color change and wound.  And  as noted in HPI   Physical Exam Triage Vital Signs ED Triage Vitals  Enc Vitals Group     BP 11/30/22 1406 113/82     Pulse Rate 11/30/22 1406 85     Resp 11/30/22 1406 16     Temp 11/30/22 1406 98.7 F (37.1 C)     Temp Source 11/30/22 1406 Temporal     SpO2 11/30/22 1406 97 %     Weight --      Height --      Head Circumference --      Peak Flow --      Pain Score 11/30/22 1416 0     Pain Loc --      Pain Edu? --      Excl. in [place]? --    No data found.  Updated Vital Signs BP 113/82 (BP Location: Left Arm)   Pulse 85   Temp 98.7 F (37.1 C) (Temporal)   Resp 16   SpO2 97%   Visual Acuity Right Eye Distance:   Left Eye Distance:   Bilateral Distance:    Right Eye Near:   Left Eye Near:    Bilateral Near:     Physical Exam Vitals and nursing note reviewed.  Constitutional:      General: He is not in acute distress.    Appearance: He is normal weight. He is not toxic-appearing.  HENT:     Right Ear: External ear normal.     Left Ear: External ear normal.  Eyes:     General: No scleral icterus.    Conjunctiva/sclera: Conjunctivae normal.  Pulmonary:     Effort: Pulmonary effort is normal.  Musculoskeletal:        General: Swelling present.     Cervical back: Neck supple.      Comments: Has large incision on dorsal R knee with stickle bandage over the skin that is not removable.   Skin:    Comments: R KNEE- with swelling, but feels hot and has some erythema under the sticky bandage he has on his knee. There is mild erythema extending below the bandage on his shin, but this area is not hot, only a little warm. There is no streaking to his groin noted. ROM is limited due to swelling and pain  Neurological:     Mental Status: He is alert and oriented to person, place, and time.     Gait: Gait abnormal.  Psychiatric:        Mood and Affect: Mood normal.        Behavior: Behavior normal.        Thought Content: Thought content normal.        Judgment: Judgment normal.      UC Treatments / Results  Labs (all labs ordered are listed, but only abnormal results are displayed) Labs Reviewed  URINE CULTURE  POCT URINALYSIS DIP (MANUAL ENTRY)    EKG   Radiology No results found.  Procedures Procedures (including critical care time)  Medications Ordered in UC Medications - No data to display  Initial Impression / Assessment and Plan / UC Course  I have reviewed the triage vital signs and the nursing notes.  Pertinent labs results that were available during my care of the patient were reviewed by me and considered in my medical decision making (see chart for details).  Urinary frequency Dysuria Possible cellulitis R knee Hyperglycemia  I am concerned his glucose are elevated due to an infection in his R  knee, since his UA is negative. I sent the urine for a culture since he was cathed and in the mean time, I placed him on Keflex as noted. Advised to call ortho tomorrow to be seen and keep appt. With PCP tomorrow.    Final Clinical Impressions(s) / UC Diagnoses   Final diagnoses:  Urinary frequency  Elevated glucose  Cellulitis of knee, right     Discharge Instructions      Follow up with your surgeon tomorrow Keep appointment with your PCP  tomorrow, in case the hesitation persist and is your prostate that is enlarged.      ED Prescriptions     Medication Sig Dispense Auth. Provider   cephALEXin (KEFLEX) 500 MG capsule Take 1 capsule (500 mg total) by mouth 4 (four) times daily. 28 capsule Rodriguez-Southworth, Sunday Spillers, PA-C      PDMP not reviewed this encounter.   Shelby Mattocks, PA-C 11/30/22 1655

## 2022-12-01 ENCOUNTER — Ambulatory Visit: Payer: TRICARE For Life (TFL) | Admitting: Family Medicine

## 2022-12-01 LAB — URINE CULTURE: Culture: NO GROWTH

## 2022-12-02 ENCOUNTER — Encounter: Payer: Self-pay | Admitting: Family Medicine

## 2022-12-02 ENCOUNTER — Ambulatory Visit (INDEPENDENT_AMBULATORY_CARE_PROVIDER_SITE_OTHER): Payer: Medicare PPO | Admitting: Family Medicine

## 2022-12-02 VITALS — BP 122/78 | HR 73 | Temp 97.2°F | Ht 63.0 in | Wt 159.2 lb

## 2022-12-02 DIAGNOSIS — R399 Unspecified symptoms and signs involving the genitourinary system: Secondary | ICD-10-CM | POA: Diagnosis not present

## 2022-12-02 DIAGNOSIS — E1169 Type 2 diabetes mellitus with other specified complication: Secondary | ICD-10-CM | POA: Diagnosis not present

## 2022-12-02 DIAGNOSIS — Z96651 Presence of right artificial knee joint: Secondary | ICD-10-CM | POA: Diagnosis not present

## 2022-12-02 NOTE — Assessment & Plan Note (Addendum)
Continue keflex for possible wound infection - overall improved.  Keep ortho f/u tomorrow.

## 2022-12-02 NOTE — Assessment & Plan Note (Addendum)
UA/culture at [facility] reassuringly normal.  Anticipate component of irritation after instrumentation (foley catheterized during recent knee surgery) as well as possible opiate side effect (urinary hesitancy).  Did not recheck UA today as he's on antibiotic and has been taking OTC azo. He is improving on keflex QID 7d course.  Declines DRE today given limitations after recent knee surgery.  Advised return for DRE next week if not fully improved after abx course.  Ok to continue azo and cranberry tablets. Discussed avoiding bladder irritants.  Reassess at CPE next month, sooner if needed as per above.

## 2022-12-02 NOTE — Assessment & Plan Note (Signed)
Chronic, recently deteriorated control however notes improvement over the last several days - continue current regimen.

## 2022-12-02 NOTE — Patient Instructions (Addendum)
Finish antibiotics. Continue diabetes medicines as up to now.  Continue pushing sips of fluids throughout the day  Avoid bladder irritants like spicy foods, caffeine, dark sodas.  Ok to continue cranberry tablets.  Keep physical appointment in 1 month bu return sooner if ongoing symptoms after finishing antibiotic.

## 2022-12-02 NOTE — Progress Notes (Addendum)
Patient ID: Kevin Sheppard., male    DOB: [birth year], 72 y.o.   MRN: [id number]  This visit was conducted in person.  BP 122/78   Pulse 73   Temp (!) 97.2 F (36.2 C)   Ht 5\' 3"  (1.6 m)   Wt 159 lb 4 oz (72.2 kg)   SpO2 96%   BMI 28.21 kg/m    CC: [facility] f/u visit  Subjective:   HPI: Kevin Sheppard. is a 72 y.o. male presenting on 12/02/2022 for Hospitalization Follow-up (Seen on 11/30/22 at [facility], dx cellulitis of R knee; urinary frequency; elevated glucose. Pt accompanied by wife, Terrence Dupont. )   Recent R knee replacement 11/20/2022 Mack Guise).  Seen at [facility] on Sunday with concerns over increased urination associated with cloudy urine and dysuria in setting of hyperglycemia after surgery. He also had urinary hesitation and chills at night time. UA at that time was unrevealing. UCx also reassuringly normal.   No fevers, abd pain, nausea/vomiting, perineal pain or pressure or lower back pain.   [facility] was also concerned about possible surgical wound incision - treated with keflex, sent back to ortho who he saw yesterday - didn't think there was knee infection.  He continues oxycodone 5mg  Q5-6 hours.  Had good sized BM today.  He is also taking lovenox injection daily.   He was catheterized during hospitalization.  He's taken some Azo and cranberry tablets   DM - normally managed with glipizide XL 5mg  daily, metformin 1000mg  bid, ozempic 1mg  weekly. Ozempic and metformin were held perioperatively, he restarted both last week.   Cbg this morning 149, then 167.      Relevant past medical, surgical, family and social history reviewed and updated as indicated. Interim medical history since our last visit reviewed. Allergies and medications reviewed and updated. Outpatient Medications Prior to Visit  Medication Sig Dispense Refill   atorvastatin (LIPITOR) 20 MG tablet TAKE 1 TABLET DAILY 90 tablet 3   bisacodyl (DULCOLAX) 5 MG EC tablet Take 2 tablets (10 mg total) by mouth daily as needed  for moderate constipation. 30 tablet 0   Blood Glucose Monitoring Suppl (FREESTYLE LITE) w/Device KIT Use to check sugars daily and as needed E11.69 1 kit 0   cephALEXin (KEFLEX) 500 MG capsule Take 1 capsule (500 mg total) by mouth 4 (four) times daily. 28 capsule 0   docusate sodium (COLACE) 100 MG capsule Take 1 capsule (100 mg total) by mouth 2 (two) times daily. 10 capsule 0   enoxaparin (LOVENOX) 40 MG/0.4ML injection Inject 0.4 mLs (40 mg total) into the skin daily. 12 mL 0   glipiZIDE (GLUCOTROL XL) 5 MG 24 hr tablet Take 1 tablet (5 mg total) by mouth daily with breakfast. 90 tablet 0   glucose blood (FREESTYLE LITE) test strip Use as instructed to check blood sugar 3 times a day 300 each 3   levothyroxine (SYNTHROID) 100 MCG tablet Take 1 tablet (100 mcg total) by mouth daily with breakfast. 30 tablet 0   metFORMIN (GLUCOPHAGE) 1000 MG tablet TAKE 1 TABLET TWICE A DAY WITH MEALS 180 tablet 3   Multiple Vitamins-Minerals (MULTIVITAMIN ADULTS 50+ PO) Take 1 tablet by mouth daily.     omeprazole (PRILOSEC) 40 MG capsule Take 1 capsule (40 mg total) by mouth daily as needed. 90 capsule 0   oxyCODONE (OXY IR/ROXICODONE) 5 MG immediate release tablet Take 1 tablet (5 mg total) by mouth every 4 (four) hours as needed for moderate pain (pain score  4-6). 40 tablet 0   Phenazopyridine HCl (AZO TABS PO) Take by mouth. Urinary Pain Relief- max strength and Antibacterial Protection     Semaglutide, 1 MG/DOSE, 4 MG/3ML SOPN Inject 1 mg as directed once a week. (Patient taking differently: Inject 1 mg as directed once a week. Tuesday) 9 mL 3   tadalafil (CIALIS) 10 MG tablet TAKE ONE TABLET BY MOUTH EVERY OTHER DAY AS NEEDED FOR ERECTILE DYSFUNCTION 10 tablet 2   zolpidem (AMBIEN) 10 MG tablet TAKE 1 TABLET(10 MG) BY MOUTH AT BEDTIME AS NEEDED FOR SLEEP 20 tablet 0   No facility-administered medications prior to visit.     Per HPI unless specifically indicated in ROS section below Review of  Systems  Objective:  BP 122/78   Pulse 73   Temp (!) 97.2 F (36.2 C)   Ht 5\' 3"  (1.6 m)   Wt 159 lb 4 oz (72.2 kg)   SpO2 96%   BMI 28.21 kg/m   Wt Readings from Last 3 Encounters:  12/02/22 159 lb 4 oz (72.2 kg)  11/20/22 173 lb 1 oz (78.5 kg)  11/07/22 173 lb (78.5 kg)      Physical Exam Vitals and nursing note reviewed.  Constitutional:      Appearance: Normal appearance.     Comments: Ambulates with walker  HENT:     Head: Normocephalic and atraumatic.     Mouth/Throat:     Mouth: Mucous membranes are moist.     Pharynx: Oropharynx is clear. No oropharyngeal exudate or posterior oropharyngeal erythema.  Eyes:     Extraocular Movements: Extraocular movements intact.     Pupils: Pupils are equal, round, and reactive to light.  Cardiovascular:     Rate and Rhythm: Normal rate and regular rhythm.     Pulses: Normal pulses.     Heart sounds: Normal heart sounds. No murmur heard. Pulmonary:     Effort: Pulmonary effort is normal. No respiratory distress.     Breath sounds: Normal breath sounds. No wheezing, rhonchi or rales.  Abdominal:     General: Bowel sounds are normal. There is no distension.     Palpations: Abdomen is soft. There is no mass.     Tenderness: There is no abdominal tenderness. There is no right CVA tenderness, left CVA tenderness, guarding or rebound.     Hernia: No hernia is present.  Musculoskeletal:        General: Swelling and tenderness present.     Cervical back: Normal range of motion and neck supple.     Right lower leg: No edema.     Left lower leg: No edema.     Comments: Hydrocolloid dressing overlying surgical incision, warmth without significant erythema surrounding surgical site, limited ROM due to pain and swelling but no knee joint stiffness   Skin:    General: Skin is warm and dry.     Findings: No rash.  Neurological:     Mental Status: He is alert.  Psychiatric:        Mood and Affect: Mood normal.        Behavior: Behavior  normal.       Results for orders placed or performed during the hospital encounter of 11/30/22  Urine Culture   Specimen: Urine, Clean Catch  Result Value Ref Range   Specimen Description URINE, CLEAN CATCH    Special Requests Immunocompromised    Culture      NO GROWTH Performed at [facility], [address] [place], [place] [id number]    Report Status 12/01/2022 FINAL   POCT urinalysis dipstick  Result Value Ref Range   Color, UA yellow yellow   Clarity, UA clear clear   Glucose, UA negative negative mg/dL   Bilirubin, UA negative negative   Ketones, POC UA negative negative mg/dL   Spec Grav, UA 1.020 1.010 - 1.025   Blood, UA negative negative   pH, UA 6.5 5.0 - 8.0   Protein Ur, POC negative negative mg/dL   Urobilinogen, UA 1.0 0.2 or 1.0 E.U./dL   Nitrite, UA Negative Negative   Leukocytes, UA Negative Negative    Assessment & Plan:   Problem List Items Addressed This Visit     Type 2 diabetes mellitus with other specified complication ([place])    Chronic, recently deteriorated control however notes improvement over the last several days - continue current regimen.       S/P total knee arthroplasty, right    Continue keflex for possible wound infection - overall improved.  Keep ortho f/u tomorrow.       Lower urinary tract symptoms (LUTS) - Primary    UA/culture at [facility] reassuringly normal.  Anticipate component of irritation after instrumentation (foley catheterized during recent knee surgery) as well as possible opiate side effect (urinary hesitancy).  Did not recheck UA today as he's on antibiotic and has been taking OTC azo. He is improving on keflex QID 7d course.  Declines DRE today given limitations after recent knee surgery.  Advised return for DRE next week if not fully improved after abx course.  Ok to continue azo and cranberry tablets. Discussed avoiding bladder irritants.  Reassess at CPE next month, sooner if needed as per above.          No orders of the defined types were placed in this encounter.   No orders of the defined types were placed in this encounter.   Patient Instructions  Finish antibiotics. Continue diabetes medicines as up to now.  Continue pushing sips of fluids throughout the day  Avoid bladder irritants like spicy foods, caffeine, dark sodas.  Ok to continue cranberry tablets.  Keep physical appointment in 1 month bu return sooner if ongoing symptoms after finishing antibiotic.   Follow up plan: Return if symptoms worsen or fail to improve.  Ria Bush, MD

## 2022-12-03 DIAGNOSIS — M25561 Pain in right knee: Secondary | ICD-10-CM | POA: Diagnosis not present

## 2022-12-03 DIAGNOSIS — M25661 Stiffness of right knee, not elsewhere classified: Secondary | ICD-10-CM | POA: Diagnosis not present

## 2022-12-03 DIAGNOSIS — Z96651 Presence of right artificial knee joint: Secondary | ICD-10-CM | POA: Diagnosis not present

## 2022-12-05 DIAGNOSIS — M25661 Stiffness of right knee, not elsewhere classified: Secondary | ICD-10-CM | POA: Diagnosis not present

## 2022-12-05 DIAGNOSIS — M25561 Pain in right knee: Secondary | ICD-10-CM | POA: Diagnosis not present

## 2022-12-08 DIAGNOSIS — M25561 Pain in right knee: Secondary | ICD-10-CM | POA: Diagnosis not present

## 2022-12-08 DIAGNOSIS — M25661 Stiffness of right knee, not elsewhere classified: Secondary | ICD-10-CM | POA: Diagnosis not present

## 2022-12-11 DIAGNOSIS — M25561 Pain in right knee: Secondary | ICD-10-CM | POA: Diagnosis not present

## 2022-12-11 DIAGNOSIS — M25661 Stiffness of right knee, not elsewhere classified: Secondary | ICD-10-CM | POA: Diagnosis not present

## 2022-12-15 DIAGNOSIS — M25561 Pain in right knee: Secondary | ICD-10-CM | POA: Diagnosis not present

## 2022-12-15 DIAGNOSIS — M25661 Stiffness of right knee, not elsewhere classified: Secondary | ICD-10-CM | POA: Diagnosis not present

## 2022-12-17 DIAGNOSIS — M25661 Stiffness of right knee, not elsewhere classified: Secondary | ICD-10-CM | POA: Diagnosis not present

## 2022-12-17 DIAGNOSIS — M25561 Pain in right knee: Secondary | ICD-10-CM | POA: Diagnosis not present

## 2022-12-22 DIAGNOSIS — M25561 Pain in right knee: Secondary | ICD-10-CM | POA: Diagnosis not present

## 2022-12-22 DIAGNOSIS — M25661 Stiffness of right knee, not elsewhere classified: Secondary | ICD-10-CM | POA: Diagnosis not present

## 2022-12-24 DIAGNOSIS — M25561 Pain in right knee: Secondary | ICD-10-CM | POA: Diagnosis not present

## 2022-12-24 DIAGNOSIS — M25661 Stiffness of right knee, not elsewhere classified: Secondary | ICD-10-CM | POA: Diagnosis not present

## 2022-12-30 DIAGNOSIS — M24661 Ankylosis, right knee: Secondary | ICD-10-CM | POA: Diagnosis not present

## 2022-12-30 DIAGNOSIS — M25561 Pain in right knee: Secondary | ICD-10-CM | POA: Diagnosis not present

## 2022-12-30 DIAGNOSIS — M25661 Stiffness of right knee, not elsewhere classified: Secondary | ICD-10-CM | POA: Diagnosis not present

## 2022-12-31 ENCOUNTER — Other Ambulatory Visit: Payer: Self-pay | Admitting: Orthopedic Surgery

## 2023-01-01 ENCOUNTER — Other Ambulatory Visit: Payer: Self-pay

## 2023-01-01 ENCOUNTER — Ambulatory Visit: Payer: Medicare PPO | Admitting: Anesthesiology

## 2023-01-01 ENCOUNTER — Encounter: Payer: Self-pay | Admitting: Orthopedic Surgery

## 2023-01-01 ENCOUNTER — Encounter
Admission: RE | Disposition: A | Discharge status: Home / Self Care | Payer: Self-pay | Source: Home / Self Care | Attending: Orthopedic Surgery

## 2023-01-01 ENCOUNTER — Ambulatory Visit
Admission: RE | Admit: 2023-01-01 | Discharge: 2023-01-01 | Disposition: A | Discharge status: Home / Self Care | Payer: Medicare PPO | Attending: Orthopedic Surgery | Admitting: Orthopedic Surgery

## 2023-01-01 DIAGNOSIS — Z7985 Long-term (current) use of injectable non-insulin antidiabetic drugs: Secondary | ICD-10-CM | POA: Diagnosis not present

## 2023-01-01 DIAGNOSIS — E1142 Type 2 diabetes mellitus with diabetic polyneuropathy: Secondary | ICD-10-CM | POA: Diagnosis not present

## 2023-01-01 DIAGNOSIS — M24661 Ankylosis, right knee: Secondary | ICD-10-CM | POA: Diagnosis not present

## 2023-01-01 DIAGNOSIS — K219 Gastro-esophageal reflux disease without esophagitis: Secondary | ICD-10-CM | POA: Diagnosis not present

## 2023-01-01 DIAGNOSIS — E039 Hypothyroidism, unspecified: Secondary | ICD-10-CM | POA: Diagnosis not present

## 2023-01-01 DIAGNOSIS — Z7984 Long term (current) use of oral hypoglycemic drugs: Secondary | ICD-10-CM | POA: Insufficient documentation

## 2023-01-01 DIAGNOSIS — T8482XA Fibrosis due to internal orthopedic prosthetic devices, implants and grafts, initial encounter: Secondary | ICD-10-CM | POA: Diagnosis not present

## 2023-01-01 DIAGNOSIS — Z01818 Encounter for other preprocedural examination: Secondary | ICD-10-CM

## 2023-01-01 HISTORY — PX: KNEE CLOSED REDUCTION: SHX995

## 2023-01-01 LAB — C-REACTIVE PROTEIN: CRP: <0.5 mg/dL (ref <1.0)

## 2023-01-01 LAB — CBC
HCT: 37.2 % — ABNORMAL LOW (ref 39.0–52.0)
Hemoglobin: 12.4 g/dL — ABNORMAL LOW (ref 13.0–17.0)
MCH: 31.7 pg (ref 26.0–34.0)
MCHC: 33.3 g/dL (ref 30.0–36.0)
MCV: 95.1 fL (ref 80.0–100.0)
Platelets: 247 10*3/uL (ref 150–400)
RBC: 3.91 MIL/uL — ABNORMAL LOW (ref 4.22–5.81)
RDW: 12.1 % (ref 11.5–15.5)
WBC: 6.5 10*3/uL (ref 4.0–10.5)
nRBC: 0 % (ref 0.0–0.2)

## 2023-01-01 LAB — SEDIMENTATION RATE: Sed Rate: 20 mm/hr (ref 0–20)

## 2023-01-01 LAB — GLUCOSE, CAPILLARY
Glucose-Capillary: 126 mg/dL — ABNORMAL HIGH (ref 70–99)
Glucose-Capillary: 142 mg/dL — ABNORMAL HIGH (ref 70–99)

## 2023-01-01 SURGERY — MANIPULATION, KNEE, CLOSED
Anesthesia: General | Site: Knee | Laterality: Right

## 2023-01-01 MED ORDER — SUCCINYLCHOLINE CHLORIDE 200 MG/10ML IV SOSY
PREFILLED_SYRINGE | INTRAVENOUS | Status: DC | PRN
  Administered 2023-01-01: 80 mg via INTRAVENOUS

## 2023-01-01 MED ORDER — ROCURONIUM BROMIDE 100 MG/10ML IV SOLN
INTRAVENOUS | Status: DC | PRN
  Administered 2023-01-01: 20 mg via INTRAVENOUS

## 2023-01-01 MED ORDER — PROMETHAZINE HCL 25 MG/ML IJ SOLN: 6.2500 mg | INTRAMUSCULAR | Status: DC | PRN

## 2023-01-01 MED ORDER — FENTANYL CITRATE (PF) 100 MCG/2ML IJ SOLN
INTRAMUSCULAR | Status: DC | PRN
  Administered 2023-01-01 (×2): 50 ug via INTRAVENOUS

## 2023-01-01 MED ORDER — PROPOFOL 10 MG/ML IV BOLUS
INTRAVENOUS | Status: DC | PRN
  Administered 2023-01-01: 160 mg via INTRAVENOUS
  Administered 2023-01-01: 20 mg via INTRAVENOUS

## 2023-01-01 MED ORDER — ORAL CARE MOUTH RINSE: 15.0000 mL | Freq: Once | OROMUCOSAL | Status: AC

## 2023-01-01 MED ORDER — FENTANYL CITRATE (PF) 100 MCG/2ML IJ SOLN
INTRAMUSCULAR | Status: AC
  Filled 2023-01-01: qty 2

## 2023-01-01 MED ORDER — DEXAMETHASONE SODIUM PHOSPHATE 10 MG/ML IJ SOLN
INTRAMUSCULAR | Status: DC | PRN
  Administered 2023-01-01: 10 mg via INTRAVENOUS

## 2023-01-01 MED ORDER — ACETAMINOPHEN 500 MG PO TABS
ORAL_TABLET | ORAL | Status: AC
  Filled 2023-01-01: qty 2

## 2023-01-01 MED ORDER — ONDANSETRON HCL 4 MG/2ML IJ SOLN
INTRAMUSCULAR | Status: DC | PRN
  Administered 2023-01-01: 4 mg via INTRAVENOUS

## 2023-01-01 MED ORDER — PROPOFOL 10 MG/ML IV BOLUS
INTRAVENOUS | Status: AC
  Filled 2023-01-01: qty 20

## 2023-01-01 MED ORDER — LIDOCAINE HCL (CARDIAC) PF 100 MG/5ML IV SOSY
PREFILLED_SYRINGE | INTRAVENOUS | Status: DC | PRN
  Administered 2023-01-01: 60 mg via INTRAVENOUS

## 2023-01-01 MED ORDER — FENTANYL CITRATE (PF) 100 MCG/2ML IJ SOLN: 25.0000 ug | INTRAMUSCULAR | Status: DC | PRN

## 2023-01-01 MED ORDER — OXYCODONE HCL 5 MG PO TABS
ORAL_TABLET | ORAL | Status: AC
  Filled 2023-01-01: qty 1

## 2023-01-01 MED ORDER — DROPERIDOL 2.5 MG/ML IJ SOLN: 0.6250 mg | Freq: Once | INTRAMUSCULAR | Status: DC | PRN

## 2023-01-01 MED ORDER — SUGAMMADEX SODIUM 200 MG/2ML IV SOLN
INTRAVENOUS | Status: DC | PRN
  Administered 2023-01-01: 150 mg via INTRAVENOUS

## 2023-01-01 MED ORDER — ACETAMINOPHEN 10 MG/ML IV SOLN: 1000.0000 mg | Freq: Once | INTRAVENOUS | Status: DC | PRN

## 2023-01-01 MED ORDER — OXYCODONE HCL 5 MG PO TABS
5.0000 mg | ORAL_TABLET | Freq: Once | ORAL | Status: AC | PRN
  Administered 2023-01-01: 5 mg via ORAL

## 2023-01-01 MED ORDER — CHLORHEXIDINE GLUCONATE 0.12 % MT SOLN
OROMUCOSAL | Status: AC
  Filled 2023-01-01: qty 15

## 2023-01-01 MED ORDER — ACETAMINOPHEN 500 MG PO TABS
1000.0000 mg | ORAL_TABLET | Freq: Once | ORAL | Status: AC
  Administered 2023-01-01: 1000 mg via ORAL

## 2023-01-01 MED ORDER — SODIUM CHLORIDE 0.9 % IV SOLN: INTRAVENOUS | Status: DC

## 2023-01-01 MED ORDER — CHLORHEXIDINE GLUCONATE 0.12 % MT SOLN
15.0000 mL | Freq: Once | OROMUCOSAL | Status: AC
  Administered 2023-01-01: 15 mL via OROMUCOSAL

## 2023-01-01 MED ORDER — OXYCODONE HCL 5 MG/5ML PO SOLN: 5.0000 mg | Freq: Once | ORAL | Status: AC | PRN

## 2023-01-01 SURGICAL SUPPLY — 5 items
IMMBOLIZER KNEE 19 BLUE UNIV (SOFTGOODS) IMPLANT
KIT TURNOVER KIT A (KITS) ×1 IMPLANT
MANIFOLD NEPTUNE II (INSTRUMENTS) ×1 IMPLANT
TRAP FLUID SMOKE EVACUATOR (MISCELLANEOUS) ×1 IMPLANT
WATER STERILE IRR 500ML POUR (IV SOLUTION) ×1 IMPLANT

## 2023-01-01 NOTE — Anesthesia Preprocedure Evaluation (Addendum)
Anesthesia Evaluation  Patient identified by MRN, date of birth, ID band Patient awake    Reviewed: Allergy & Precautions, NPO status , Patient's Chart, lab work & pertinent test results  History of Anesthesia Complications Negative for: history of anesthetic complications  Airway Mallampati: II  TM Distance: >3 FB Neck ROM: Full    Dental  (+) Teeth Intact, Implants   Pulmonary neg pulmonary ROS, neg sleep apnea, neg COPD, Patient abstained from smoking.Not current smoker   Pulmonary exam normal breath sounds clear to auscultation       Cardiovascular Exercise Tolerance: Good METS(-) hypertension(-) CAD and (-) Past MI negative cardio ROS (-) dysrhythmias  Rhythm:Regular Rate:Normal - Systolic murmurs    Neuro/Psych negative neurological ROS  negative psych ROS   GI/Hepatic ,GERD  Medicated and Controlled,,(+)     (-) substance abuse    Endo/Other  diabetes, Well Controlled, Type 2Hypothyroidism  On GLP1 injections, took it Monday. Denies gastric symptoms. Bedside gastric ultrasound shows particulate matter in antrum. Could be pre-op pills with water but RSI is planned.   Renal/GU negative Renal ROS     Musculoskeletal  (+) Arthritis ,    Abdominal Normal abdominal exam  (+)   Peds  Hematology   Anesthesia Other Findings Past Medical History: 09/2019: COVID-19 virus infection No date: Diabetes type 2, controlled (H[facility]) No date: Erectile dysfunction No date: GERD (gastroesophageal reflux disease) No date: History of chicken pox No date: History of colon polyps     Comment:  benign No date: History of kidney stones No date: HLD (hyperlipidemia) No date: Hypothyroid No date: Left anterior fascicular block No date: Osteoarthritis of right knee 12/07/2013: Peripheral neuropathy     Comment:  Thyroid related neuropathy 1961: Undescended left testicle     Comment:  after trauma with dodgeball   Reproductive/Obstetrics                             Anesthesia Physical Anesthesia Plan  ASA: 2  Anesthesia Plan: General   Post-op Pain Management: Tylenol PO (pre-op)*   Induction: Intravenous and Rapid sequence  PONV Risk Score and Plan: 1 and Ondansetron  Airway Management Planned: Oral ETT  Additional Equipment: None  Intra-op Plan:   Post-operative Plan:   Informed Consent: I have reviewed the patients History and Physical, chart, labs and discussed the procedure including the risks, benefits and alternatives for the proposed anesthesia with the patient or authorized representative who has indicated his/her understanding and acceptance.     Dental advisory given  Plan Discussed with: CRNA and Surgeon  Anesthesia Plan Comments:        Anesthesia Quick Evaluation

## 2023-01-01 NOTE — Anesthesia Postprocedure Evaluation (Signed)
Anesthesia Post Note  Patient: Kevin Sheppard.  Procedure(s) Performed: CLOSED MANIPULATION KNEE (Right: Knee)  Patient location during evaluation: PACU Anesthesia Type: General Level of consciousness: awake and alert, oriented and patient cooperative Pain management: pain level controlled Vital Signs Assessment: post-procedure vital signs reviewed and stable Respiratory status: spontaneous breathing, nonlabored ventilation and respiratory function stable Cardiovascular status: blood pressure returned to baseline and stable Postop Assessment: adequate PO intake Anesthetic complications: no   No notable events documented.   Last Vitals:  Vitals:   01/01/23 0915 01/01/23 0925  BP: 127/75 128/72  Pulse: 65 60  Resp: 15 18  Temp: (!) 36.1 C 36.6 C  SpO2: 100%     Last Pain:  Vitals:   01/01/23 0925  TempSrc: Temporal  PainSc: 0-No pain                 Reed Breech

## 2023-01-01 NOTE — Transfer of Care (Signed)
Immediate Anesthesia Transfer of Care Note  Patient: Kevin Sheppard.  Procedure(s) Performed: CLOSED MANIPULATION KNEE (Right: Knee)  Patient Location: PACU  Anesthesia Type:General  Level of Consciousness: awake, alert , and oriented  Airway & Oxygen Therapy: Patient Spontanous Breathing and Patient connected to nasal cannula oxygen  Post-op Assessment: Report given to RN and Post -op Vital signs reviewed and stable  Post vital signs: Reviewed and stable  Last Vitals:  Vitals Value Taken Time  BP 122/76 01/01/23 0843  Temp 36.1 C 01/01/23 0842  Pulse 67 01/01/23 0843  Resp 9 01/01/23 0843  SpO2 100 % 01/01/23 0843  Vitals shown include unvalidated device data.  Last Pain:  Vitals:   01/01/23 0728  TempSrc: Temporal  PainSc: 2          Complications: No notable events documented.

## 2023-01-01 NOTE — Discharge Instructions (Signed)
AMBULATORY SURGERY  ?DISCHARGE INSTRUCTIONS ? ? ?The drugs that you were given will stay in your system until tomorrow so for the next 24 hours you should not: ? ?Drive an automobile ?Make any legal decisions ?Drink any alcoholic beverage ? ? ?You may resume regular meals tomorrow.  Today it is better to start with liquids and gradually work up to solid foods. ? ?You may eat anything you prefer, but it is better to start with liquids, then soup and crackers, and gradually work up to solid foods. ? ? ?Please notify your doctor immediately if you have any unusual bleeding, trouble breathing, redness and pain at the surgery site, drainage, fever, or pain not relieved by medication. ? ? ? ?Additional Instructions: ? ? ? ?Please contact your physician with any problems or Same Day Surgery at [phone], Monday through Friday 6 am to 4 pm, or [place] [facility] [place] Main number at [phone].  ?

## 2023-01-01 NOTE — H&P (Signed)
PREOPERATIVE H&P  Chief Complaint: Right Knee Arthrofibrosis  HPI: Kevin Sheppard. is a 72 y.o. male who presents for preoperative history and physical with a diagnosis of Right Knee Arthrofibrosis. Patient has developed significant stiffness in the right knee post-operatively despite physical therapy.  He has been recommended for a manipulation under anesthesia.  Patient agrees with this plan.   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   Undescended left testicle 1961   after trauma with dodgeball   Past Surgical History:  Procedure Laterality Date   COLONOSCOPY  2004;2009   first with b9 polyps, then diverticulosis no polyps, rpt 69yrs ([facility])   COLONOSCOPY WITH PROPOFOL N/A 08/30/2018   2 TA, 1 SSP, diverticulosis, rpt 5 yrs Servando Snare, Darren, MD)   MENISCUS REPAIR Left 2008   MENISCUS REPAIR Right 2002   POLYPECTOMY N/A 08/30/2018   Procedure: POLYPECTOMY;  Surgeon: Midge Minium, MD;  Location: [facility];  Service: Endoscopy;  Laterality: N/A;   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   Procedure: TOTAL KNEE ARTHROPLASTY;  Surgeon: Juanell Fairly, MD;  Location: [facility];  Service: Orthopedics;  Laterality: Right;   Social History   Socioeconomic History   Marital status: Married    Spouse name: Kara Mead   Number of children: 4   Years of education: Not on file   Highest education level: Not on file  Occupational History   Occupation: Advertising copywriter  Tobacco Use   Smoking status: Never   Smokeless tobacco: Never  Vaping Use   Vaping Use: Never used  Substance and Sexual  Activity   Alcohol use: Not Currently    Comment: occassional   Drug use: No   Sexual activity: Yes  Other Topics Concern   Not on file  Social History Narrative   He lives with wife.  2 dogs. They have grown 4 children and 3 grandchildren.    Retired Dentist)   He works on Careers adviser, 3rd shift   Activity: goes to gym regularly   Diet: good water, fruits/vegetables daily   Social Determinants of Corporate investment banker Strain: Not on file  Food Insecurity: No Food Insecurity (11/20/2022)   Hunger Vital Sign    Worried About Running Out of Food in the Last Year: Never true    Ran Out of Food in the Last Year: Never true  Transportation Needs: No Transportation Needs (11/20/2022)   PRAPARE - Administrator, Civil Service (Medical): No    Lack of Transportation (Non-Medical): No  Physical Activity: Not on file  Stress: Not on file  Social Connections: Not on file   Family History  Problem Relation Age of Onset   Stroke Father    Diabetes Father    Other Mother        blood disease, died 64 (?porphyria)   Diabetes type II Brother    Diabetes type II Maternal Aunt    Hypertension Sister    Stroke Sister 52   Thyroid disease Sister  Cancer Neg Hx    CAD Neg Hx    Allergies  Allergen Reactions   Lyrica [Pregabalin] Rash   Prior to Admission medications   Medication Sig Start Date End Date Taking? Authorizing Provider  atorvastatin (LIPITOR) 20 MG tablet TAKE 1 TABLET DAILY 02/28/22  Yes Eustaquio Boyden, MD  bisacodyl (DULCOLAX) 5 MG EC tablet Take 2 tablets (10 mg total) by mouth daily as needed for moderate constipation. 11/21/22  Yes Juanell Fairly, MD  docusate sodium (COLACE) 100 MG capsule Take 1 capsule (100 mg total) by mouth 2 (two) times daily. 11/21/22  Yes Juanell Fairly, MD  glipiZIDE (GLUCOTROL XL) 5 MG 24 hr tablet Take 1 tablet (5 mg total) by mouth daily with breakfast. 10/22/22  Yes Eustaquio Boyden, MD  levothyroxine  (SYNTHROID) 100 MCG tablet Take 1 tablet (100 mcg total) by mouth daily with breakfast. 11/04/22  Yes Eustaquio Boyden, MD  metFORMIN (GLUCOPHAGE) 1000 MG tablet TAKE 1 TABLET TWICE A DAY WITH MEALS 02/28/22  Yes Eustaquio Boyden, MD  Multiple Vitamins-Minerals (MULTIVITAMIN ADULTS 50+ PO) Take 1 tablet by mouth daily.   Yes [provider]  oxyCODONE (OXY IR/ROXICODONE) 5 MG immediate release tablet Take 1 tablet (5 mg total) by mouth every 4 (four) hours as needed for moderate pain (pain score 4-6). 11/21/22  Yes Juanell Fairly, MD  Blood Glucose Monitoring Suppl (FREESTYLE LITE) w/Device KIT Use to check sugars daily and as needed E11.69 10/14/22   Eustaquio Boyden, MD  cephALEXin (KEFLEX) 500 MG capsule Take 1 capsule (500 mg total) by mouth 4 (four) times daily. Patient not taking: Reported on 01/01/2023 11/30/22   Rodriguez-Southworth, Nettie Elm, PA-C  enoxaparin (LOVENOX) 40 MG/0.4ML injection Inject 0.4 mLs (40 mg total) into the skin daily. 11/22/22 12/22/22  Juanell Fairly, MD  glucose blood (FREESTYLE LITE) test strip Use as instructed to check blood sugar 3 times a day 10/16/22   Eustaquio Boyden, MD  omeprazole (PRILOSEC) 40 MG capsule Take 1 capsule (40 mg total) by mouth daily as needed. 11/17/22   Eustaquio Boyden, MD  Phenazopyridine HCl (AZO TABS PO) Take by mouth. Urinary Pain Relief- max strength and Antibacterial Protection    [provider]  Semaglutide, 1 MG/DOSE, 4 MG/3ML SOPN Inject 1 mg as directed once a week. Patient taking differently: Inject 1 mg as directed once a week. Tuesday 09/05/22   Eustaquio Boyden, MD  tadalafil (CIALIS) 10 MG tablet TAKE ONE TABLET BY MOUTH EVERY OTHER DAY AS NEEDED FOR ERECTILE DYSFUNCTION 11/03/22   Eustaquio Boyden, MD  zolpidem (AMBIEN) 10 MG tablet TAKE 1 TABLET(10 MG) BY MOUTH AT BEDTIME AS NEEDED FOR SLEEP 11/13/22   Eustaquio Boyden, MD     Positive ROS: All other systems have been reviewed and were otherwise negative with  the exception of those mentioned in the HPI and as above.  Physical Exam: General: Alert, no acute distress Cardiovascular: Regular rate and rhythm, no murmurs rubs or gallops.  No pedal edema Respiratory: Clear to auscultation bilaterally, no wheezes rales or rhonchi. No cyanosis, no use of accessory musculature GI: No organomegaly, abdomen is soft and non-tender nondistended with positive bowel sounds. Skin: Skin intact, no lesions within the operative field. Neurologic: Sensation intact distally Psychiatric: Patient is competent for consent with normal mood and affect Lymphatic: No cervical lymphadenopathy  MUSCULOSKELETAL: Right knee: Patient's skin is intact.  He has flexion of approximately 70 to 80 degrees and extension to -15 to 20 degrees.  He has no ligamentous laxity.  He  has no calf tenderness or lower leg edema.  Distally is neurovascular intact with 5 out of 5 strength.  Assessment: Right Knee Arthrofibrosis  Plan: Plan for Procedure(s): CLOSED MANIPULATION RIGHT KNEE  Reviewed the details of the operation as well as the postoperative course with the patient.  Patient will follow-up in my office tomorrow for physical therapy.  I discussed the risks and benefits of surgery. The risks include but are not limited to persistent or recurrent joint stiffness or loss of motion, persistent pain, weakness or instability, fracture, tendon or ligament tear, dislocation, hardware failure or loosening and the need for further surgery including revision total knee arthroplasty. Patient understood these risks and wished to proceed.    Juanell Fairly, MD   01/01/2023 7:40 AM

## 2023-01-01 NOTE — Anesthesia Procedure Notes (Signed)
Procedure Name: Intubation Date/Time: 01/01/2023 8:16 AM  Performed by: Ginger Carne, CRNAPre-anesthesia Checklist: Patient identified, Emergency Drugs available, Suction available, Patient being monitored and Timeout performed Patient Re-evaluated:Patient Re-evaluated prior to induction Oxygen Delivery Method: Circle system utilized Preoxygenation: Pre-oxygenation with 100% oxygen Induction Type: IV induction, Rapid sequence and Cricoid Pressure applied Laryngoscope Size: McGraph and 3 Grade View: Grade I Tube type: Oral Tube size: 7.5 mm Number of attempts: 1 Airway Equipment and Method: Stylet and Video-laryngoscopy Placement Confirmation: ETT inserted through vocal cords under direct vision, positive ETCO2 and breath sounds checked- equal and bilateral Secured at: 20 cm Tube secured with: Tape Dental Injury: Teeth and Oropharynx as per pre-operative assessment

## 2023-01-01 NOTE — Op Note (Signed)
01/01/2023  9:14 AM  PATIENT:  Kevin Sheppard.    PRE-OPERATIVE DIAGNOSIS:  Right Knee Arthrofibrosis  POST-OPERATIVE DIAGNOSIS:  Same  PROCEDURE:  CLOSED MANIPULATION RIGHT KNEE  SURGEON:  Juanell Fairly, MD  ANESTHESIA:   General  PREOPERATIVE INDICATIONS:  Kevin Sheppard. is a  72 y.o. male with a diagnosis of Right Knee Arthrofibrosis s/p right total knee arthroplasty on 11/20/2022 despite postop physical therapy I recommended close manipulation of his right knee to assist with improving his range of motion..    I discussed the risks and benefits of surgery. The risks include but are not limited to persistent or recurrent joint stiffness or loss of motion, persistent pain, weakness or instability, fracture, dislocation, hardware failure and the need for further surgery. Patient understood these risks and wished to proceed.   OPERATIVE FINDINGS: Right knee premanipulation range of motion of -20 to 70 degrees.  OPERATIVE PROCEDURE: Patient was met in the preoperative area.  I marked the right knee according to hospital's quickset of surgery protocol.  He was brought to the operating room was placed supine on the operative table.  He underwent general anesthesia with endotracheal intubation as an ultrasound preoperatively showed contents in his stomach.  Patient then underwent closed manipulation of his right knee including flexion extension and patella manipulation.  Following the manipulation the patient had a range of motion of near 120 degrees of flexion and extension of approximately -5 degrees.  Knee immobilizer was placed on the right knee to maintain full extension.  Patient was then awoken and brought to the PACU in stable condition.  I was present for the entire case.  I spoke with the patient's wife by phone following surgery to let her know the case of pain performed successfully and the patient was stable in the recovery room.  I reviewed the postoperative instructions with her.

## 2023-01-02 DIAGNOSIS — M25561 Pain in right knee: Secondary | ICD-10-CM | POA: Insufficient documentation

## 2023-01-02 DIAGNOSIS — M25661 Stiffness of right knee, not elsewhere classified: Secondary | ICD-10-CM | POA: Insufficient documentation

## 2023-01-05 ENCOUNTER — Other Ambulatory Visit (INDEPENDENT_AMBULATORY_CARE_PROVIDER_SITE_OTHER): Payer: Medicare PPO

## 2023-01-05 ENCOUNTER — Other Ambulatory Visit: Payer: Medicare PPO

## 2023-01-05 ENCOUNTER — Encounter: Payer: Self-pay | Admitting: Family Medicine

## 2023-01-05 ENCOUNTER — Other Ambulatory Visit: Payer: Self-pay | Admitting: Family Medicine

## 2023-01-05 DIAGNOSIS — E1169 Type 2 diabetes mellitus with other specified complication: Secondary | ICD-10-CM

## 2023-01-05 DIAGNOSIS — Z125 Encounter for screening for malignant neoplasm of prostate: Secondary | ICD-10-CM | POA: Diagnosis not present

## 2023-01-05 DIAGNOSIS — E785 Hyperlipidemia, unspecified: Secondary | ICD-10-CM

## 2023-01-05 DIAGNOSIS — M25661 Stiffness of right knee, not elsewhere classified: Secondary | ICD-10-CM | POA: Diagnosis not present

## 2023-01-05 DIAGNOSIS — E039 Hypothyroidism, unspecified: Secondary | ICD-10-CM

## 2023-01-05 DIAGNOSIS — M25561 Pain in right knee: Secondary | ICD-10-CM | POA: Diagnosis not present

## 2023-01-05 LAB — MICROALBUMIN / CREATININE URINE RATIO
Creatinine,U: 95.1 mg/dL
Microalb Creat Ratio: 2.5 mg/g (ref 0.0–30.0)
Microalb, Ur: 2.3 mg/dL — ABNORMAL HIGH (ref 0.0–1.9)

## 2023-01-06 LAB — COMPREHENSIVE METABOLIC PANEL
ALT: 20 U/L (ref 0–53)
AST: 16 U/L (ref 0–37)
Albumin: 4.7 g/dL (ref 3.5–5.2)
Alkaline Phosphatase: 107 U/L (ref 39–117)
BUN: 24 mg/dL — ABNORMAL HIGH (ref 6–23)
CO2: 24 mEq/L (ref 19–32)
Calcium: 10.5 mg/dL (ref 8.4–10.5)
Chloride: 100 mEq/L (ref 96–112)
Creatinine, Ser: 0.78 mg/dL (ref 0.40–1.50)
GFR: 90.29 mL/min (ref >60.00)
Glucose, Bld: 252 mg/dL — ABNORMAL HIGH (ref 70–99)
Potassium: 4.5 mEq/L (ref 3.5–5.1)
Sodium: 137 mEq/L (ref 135–145)
Total Bilirubin: 0.4 mg/dL (ref 0.2–1.2)
Total Protein: 7.2 g/dL (ref 6.0–8.3)

## 2023-01-06 LAB — TSH: TSH: 0.28 u[IU]/mL — ABNORMAL LOW (ref 0.35–5.50)

## 2023-01-06 LAB — LIPID PANEL
Cholesterol: 155 mg/dL (ref 0–200)
HDL: 56.2 mg/dL (ref >39.00)
LDL Cholesterol: 77 mg/dL (ref 0–99)
NonHDL: 98.56
Total CHOL/HDL Ratio: 3
Triglycerides: 106 mg/dL (ref 0.0–149.0)
VLDL: 21.2 mg/dL (ref 0.0–40.0)

## 2023-01-06 LAB — PSA: PSA: 0.41 ng/mL (ref 0.10–4.00)

## 2023-01-09 DIAGNOSIS — M25561 Pain in right knee: Secondary | ICD-10-CM | POA: Diagnosis not present

## 2023-01-09 DIAGNOSIS — M25661 Stiffness of right knee, not elsewhere classified: Secondary | ICD-10-CM | POA: Diagnosis not present

## 2023-01-11 LAB — FRUCTOSAMINE: Fructosamine: 348 umol/L — ABNORMAL HIGH (ref 205–285)

## 2023-01-12 ENCOUNTER — Encounter: Payer: Self-pay | Admitting: Family Medicine

## 2023-01-12 ENCOUNTER — Ambulatory Visit (INDEPENDENT_AMBULATORY_CARE_PROVIDER_SITE_OTHER): Payer: Medicare PPO | Admitting: Family Medicine

## 2023-01-12 VITALS — BP 122/84 | HR 92 | Temp 97.6°F | Ht 63.5 in | Wt 154.1 lb

## 2023-01-12 DIAGNOSIS — Z96651 Presence of right artificial knee joint: Secondary | ICD-10-CM | POA: Diagnosis not present

## 2023-01-12 DIAGNOSIS — Z7189 Other specified counseling: Secondary | ICD-10-CM

## 2023-01-12 DIAGNOSIS — E1169 Type 2 diabetes mellitus with other specified complication: Secondary | ICD-10-CM

## 2023-01-12 DIAGNOSIS — G4726 Circadian rhythm sleep disorder, shift work type: Secondary | ICD-10-CM | POA: Diagnosis not present

## 2023-01-12 DIAGNOSIS — N529 Male erectile dysfunction, unspecified: Secondary | ICD-10-CM

## 2023-01-12 DIAGNOSIS — K219 Gastro-esophageal reflux disease without esophagitis: Secondary | ICD-10-CM

## 2023-01-12 DIAGNOSIS — M25661 Stiffness of right knee, not elsewhere classified: Secondary | ICD-10-CM | POA: Diagnosis not present

## 2023-01-12 DIAGNOSIS — E039 Hypothyroidism, unspecified: Secondary | ICD-10-CM | POA: Diagnosis not present

## 2023-01-12 DIAGNOSIS — Z0000 Encounter for general adult medical examination without abnormal findings: Secondary | ICD-10-CM

## 2023-01-12 DIAGNOSIS — E785 Hyperlipidemia, unspecified: Secondary | ICD-10-CM

## 2023-01-12 DIAGNOSIS — M25561 Pain in right knee: Secondary | ICD-10-CM | POA: Diagnosis not present

## 2023-01-12 DIAGNOSIS — E663 Overweight: Secondary | ICD-10-CM | POA: Diagnosis not present

## 2023-01-12 MED ORDER — ATORVASTATIN CALCIUM 20 MG PO TABS: 20.0000 mg | ORAL_TABLET | Freq: Every day | ORAL | 4 refills | Status: DC

## 2023-01-12 MED ORDER — LEVOTHYROXINE SODIUM 88 MCG PO TABS: 88.0000 ug | ORAL_TABLET | Freq: Every day | ORAL | 4 refills | Status: DC

## 2023-01-12 MED ORDER — GLIPIZIDE ER 5 MG PO TB24: 5.0000 mg | ORAL_TABLET | Freq: Every day | ORAL | 4 refills | Status: DC

## 2023-01-12 MED ORDER — ZOLPIDEM TARTRATE 10 MG PO TABS
10.0000 mg | ORAL_TABLET | Freq: Every evening | ORAL | 0 refills | Status: DC | PRN
Start: 2023-01-12 — End: 2023-02-24

## 2023-01-12 MED ORDER — METFORMIN HCL 1000 MG PO TABS: 1000.0000 mg | ORAL_TABLET | Freq: Two times a day (BID) | ORAL | 4 refills | Status: DC

## 2023-01-12 MED ORDER — OMEPRAZOLE 40 MG PO CPDR
40.0000 mg | DELAYED_RELEASE_CAPSULE | Freq: Every day | ORAL | 2 refills | Status: DC | PRN
Start: 2023-01-12 — End: 2023-11-09

## 2023-01-12 MED ORDER — TADALAFIL 10 MG PO TABS
ORAL_TABLET | ORAL | 6 refills | Status: DC
Start: 2023-01-12 — End: 2023-09-30

## 2023-01-12 NOTE — Assessment & Plan Note (Signed)
TSH low in setting of weight loss - will drop levothyroxine dose to daily, recheck TSH in 6 wks.

## 2023-01-12 NOTE — Assessment & Plan Note (Signed)
Preventative protocols reviewed and updated unless pt declined. Discussed healthy diet and lifestyle.  

## 2023-01-12 NOTE — Assessment & Plan Note (Signed)
Continue PRN sparing ambien use - none in recent weeks perioperatively.

## 2023-01-12 NOTE — Assessment & Plan Note (Signed)
Cialis effective, refilled.

## 2023-01-12 NOTE — Assessment & Plan Note (Signed)
Stable period on PRN PPI, rare use, none in weeks

## 2023-01-12 NOTE — Patient Instructions (Addendum)
Thyroid was a bit too high - drop levothyroxine dose to daily - new dose at pharmacy.  Bring [place] a copy of your advanced directive.  You will be due for repeat colonoscopy come 08/2023. You may call [facility] at [phone] to schedule an appointment around that time.  Return in 3 months for follow up visit

## 2023-01-12 NOTE — Progress Notes (Signed)
Ph: [phone]       Fax: [phone]   Patient ID: Kevin Sheppard., male    DOB: 1951-05-31, 72 y.o.   MRN: [id number]  This visit was conducted in person.  BP 122/84   Pulse 92   Temp 97.6 F (36.4 C) (Temporal)   Ht 5' 3.5" (1.613 m)   Wt 154 lb 2 oz (69.9 kg)   SpO2 96%   BMI 26.87 kg/m    CC: CPE Subjective:   HPI: Kevin Sheppard. is a 72 y.o. male presenting on 01/12/2023 for Annual Exam   Did not see health advisor this year.   No results found.  Flowsheet Row Office Visit from 01/12/2023 in [facility] [place]  PHQ-2 Total Score 0          01/12/2023    4:03 PM 10/14/2022    4:10 PM 01/08/2022    3:39 PM 08/09/2018    2:52 PM  Fall Risk   Falls in the past year? 1 0  0  Number falls in past yr: 1  1   Injury with Fall? 0  0   Several falls without injury, none since surgery.   Recent R knee replacement 11/20/2022 Martha Clan) followed by lower urinary tract symptoms with normal UA/UCx. He was treated with keflex antibiotic by [facility] to cover possible surgical wound infection. Did return to OR on 01/01/2023 for closed manipulation of knee under anesthesia. Saw ortho in f/u today with good report. He continues physical therapy.   DM - more difficult to control after surgery. He continues metformin 1000mg  bid, glipizide XL 5mg  once daily, ozempic 1mg  weekly.   2 brothers have had CAD - one had bypass. Father with MI age 58yo.  Would be interested in coronary CT.   Preventative: COLONOSCOPY WITH PROPOFOL 08/30/2018 - 2 TA, 1 SSP, diverticulosis, rpt 5 yrs Servando Snare, Darren, MD)  Prostate cancer screening - yearly screen  Lung cancer screening - not eligible  Flu shot yearly  COVID vaccine J&J 12/2019, booster 06/2020 Tdap 2011, 04/2020 Prevnar-13 05/2014, pneumovax 2019, 2020 (extra dose repeated at pharmacy)  RSV - discussed, declines Shingrix - 04/2018, 06/2018 Advanced directive - Discussed - has at home but needs updating. Wants wife to  be HCPOA. Asked to bring me copy.  Seat belt use discussed Sunscreen use discussed. No changing moles.  Non smoker - wife smokes indoors. Works on [employer].  Alcohol - rarely Rec drugs - stopped MJ recently Dentist q6 mo  Eye exam yearly  He lives with wife. 2 dogs. They have 4 grown children and 3 grandchildren  He works on cigarette machines, 3rd shift  Activity: goes to gym and swims regularly about 5d/wk  Diet: good water, fruits/vegetables daily      Relevant past medical, surgical, family and social history reviewed and updated as indicated. Interim medical history since our last visit reviewed. Allergies and medications reviewed and updated. Outpatient Medications Prior to Visit  Medication Sig Dispense Refill   bisacodyl (DULCOLAX) 5 MG EC tablet Take 2 tablets (10 mg total) by mouth daily as needed for moderate constipation. 30 tablet 0   Blood Glucose Monitoring Suppl (FREESTYLE LITE) w/Device KIT Use to check sugars daily and as needed E11.69 1 kit 0   docusate sodium (COLACE) 100 MG capsule Take 1 capsule (100 mg total) by mouth 2 (two) times daily. 10 capsule 0   glucose blood (FREESTYLE LITE) test strip Use as instructed to  check blood sugar 3 times a day 300 each 3   methocarbamol (ROBAXIN) 500 MG tablet      Multiple Vitamins-Minerals (MULTIVITAMIN ADULTS 50+ PO) Take 1 tablet by mouth daily.     oxyCODONE (OXY IR/ROXICODONE) 5 MG immediate release tablet Take 1 tablet (5 mg total) by mouth every 4 (four) hours as needed for moderate pain (pain score 4-6). 40 tablet 0   Semaglutide, 1 MG/DOSE, 4 MG/3ML SOPN Inject 1 mg as directed once a week. (Patient taking differently: Inject 1 mg as directed once a week. Tuesday) 9 mL 3   atorvastatin (LIPITOR) 20 MG tablet TAKE 1 TABLET DAILY 90 tablet 3   glipiZIDE (GLUCOTROL XL) 5 MG 24 hr tablet Take 1 tablet (5 mg total) by mouth daily with breakfast. 90 tablet 0   levothyroxine (SYNTHROID) 100 MCG tablet Take 1 tablet  (100 mcg total) by mouth daily with breakfast. 30 tablet 0   metFORMIN (GLUCOPHAGE) 1000 MG tablet TAKE 1 TABLET TWICE A DAY WITH MEALS 180 tablet 3   omeprazole (PRILOSEC) 40 MG capsule Take 1 capsule (40 mg total) by mouth daily as needed. 90 capsule 0   tadalafil (CIALIS) 10 MG tablet TAKE ONE TABLET BY MOUTH EVERY OTHER DAY AS NEEDED FOR ERECTILE DYSFUNCTION 10 tablet 2   zolpidem (AMBIEN) 10 MG tablet TAKE 1 TABLET(10 MG) BY MOUTH AT BEDTIME AS NEEDED FOR SLEEP 20 tablet 0   enoxaparin (LOVENOX) 40 MG/0.4ML injection Inject 0.4 mLs (40 mg total) into the skin daily. 12 mL 0   Phenazopyridine HCl (AZO TABS PO) Take by mouth. Urinary Pain Relief- max strength and Antibacterial Protection     No facility-administered medications prior to visit.     Per HPI unless specifically indicated in ROS section below Review of Systems  Constitutional:  Negative for activity change, appetite change, chills, fatigue, fever and unexpected weight change.  HENT:  Negative for hearing loss.   Eyes:  Negative for visual disturbance.  Respiratory:  Negative for cough, chest tightness, shortness of breath and wheezing.   Cardiovascular:  Positive for leg swelling (R after knee replacement). Negative for chest pain and palpitations.  Gastrointestinal:  Positive for constipation. Negative for abdominal distention, abdominal pain, blood in stool, diarrhea, nausea and vomiting.  Genitourinary:  Negative for difficulty urinating and hematuria.  Musculoskeletal:  Negative for arthralgias, myalgias and neck pain.  Skin:  Negative for rash.  Neurological:  Negative for dizziness, seizures, syncope and headaches.  Hematological:  Negative for adenopathy. Does not bruise/bleed easily.  Psychiatric/Behavioral:  Negative for dysphoric mood. The patient is not nervous/anxious.     Objective:  BP 122/84   Pulse 92   Temp 97.6 F (36.4 C) (Temporal)   Ht 5' 3.5" (1.613 m)   Wt 154 lb 2 oz (69.9 kg)   SpO2 96%    BMI 26.87 kg/m   Wt Readings from Last 3 Encounters:  01/12/23 154 lb 2 oz (69.9 kg)  01/01/23 150 lb (68 kg)  12/02/22 159 lb 4 oz (72.2 kg)      Physical Exam Vitals and nursing note reviewed.  Constitutional:      General: He is not in acute distress.    Appearance: Normal appearance. He is well-developed. He is not ill-appearing.  HENT:     Head: Normocephalic and atraumatic.     Right Ear: Hearing, tympanic membrane, ear canal and external ear normal.     Left Ear: Hearing, tympanic membrane, ear canal and external ear  normal.     Nose: Nose normal.     Mouth/Throat:     Mouth: Mucous membranes are moist.     Pharynx: Oropharynx is clear. No oropharyngeal exudate or posterior oropharyngeal erythema.  Eyes:     General: No scleral icterus.    Extraocular Movements: Extraocular movements intact.     Conjunctiva/sclera: Conjunctivae normal.     Pupils: Pupils are equal, round, and reactive to light.  Neck:     Thyroid: No thyroid mass or thyromegaly.     Vascular: No carotid bruit.  Cardiovascular:     Rate and Rhythm: Normal rate and regular rhythm.     Pulses: Normal pulses.          Radial pulses are 2+ on the right side and 2+ on the left side.     Heart sounds: Normal heart sounds. No murmur heard. Pulmonary:     Effort: Pulmonary effort is normal. No respiratory distress.     Breath sounds: Normal breath sounds. No wheezing, rhonchi or rales.  Abdominal:     General: Bowel sounds are normal. There is no distension.     Palpations: Abdomen is soft. There is no mass.     Tenderness: There is no abdominal tenderness. There is no guarding or rebound.     Hernia: No hernia is present.  Musculoskeletal:        General: Normal range of motion.     Cervical back: Normal range of motion and neck supple.     Right lower leg: No edema.     Left lower leg: No edema.  Lymphadenopathy:     Cervical: No cervical adenopathy.  Skin:    General: Skin is warm and dry.      Findings: No rash.  Neurological:     General: No focal deficit present.     Mental Status: He is alert and oriented to person, place, and time.  Psychiatric:        Mood and Affect: Mood normal.        Behavior: Behavior normal.        Thought Content: Thought content normal.        Judgment: Judgment normal.       Results for orders placed or performed in visit on 01/05/23  Fructosamine  Result Value Ref Range   Fructosamine 348 (H) 205 - 285 umol/L  PSA  Result Value Ref Range   PSA 0.41 0.10 - 4.00 ng/mL  TSH  Result Value Ref Range   TSH 0.28 (L) 0.35 - 5.50 uIU/mL  Comprehensive metabolic panel  Result Value Ref Range   Sodium 137 135 - 145 mEq/L   Potassium 4.5 3.5 - 5.1 mEq/L   Chloride 100 96 - 112 mEq/L   CO2 24 19 - 32 mEq/L   Glucose, Bld 252 (H) 70 - 99 mg/dL   BUN 24 (H) 6 - 23 mg/dL   Creatinine, Ser 1.61 0.40 - 1.50 mg/dL   Total Bilirubin 0.4 0.2 - 1.2 mg/dL   Alkaline Phosphatase 107 39 - 117 U/L   AST 16 0 - 37 U/L   ALT 20 0 - 53 U/L   Total Protein 7.2 6.0 - 8.3 g/dL   Albumin 4.7 3.5 - 5.2 g/dL   GFR 09.60 >45.40 mL/min   Calcium 10.5 8.4 - 10.5 mg/dL  Lipid panel  Result Value Ref Range   Cholesterol 155 0 - 200 mg/dL   Triglycerides 981.1 0.0 - 149.0 mg/dL  HDL 56.20 >39.00 mg/dL   VLDL 16.1 0.0 - 09.6 mg/dL   LDL Cholesterol 77 0 - 99 mg/dL   Total CHOL/HDL Ratio 3    NonHDL 98.56   Microalbumin / creatinine urine ratio  Result Value Ref Range   Microalb, Ur 2.3 (H) 0.0 - 1.9 mg/dL   Creatinine,U 04.5 mg/dL   Microalb Creat Ratio 2.5 0.0 - 30.0 mg/g    Assessment & Plan:   Problem List Items Addressed This Visit     Hypothyroidism    TSH low in setting of weight loss - will drop levothyroxine dose to daily, recheck TSH in 6 wks.      Relevant Medications   levothyroxine (SYNTHROID) 88 MCG tablet   Other Relevant Orders   TSH   Type 2 diabetes mellitus with other specified complication (HCC)    Chronic, deteriorated  control in setting of recent surgery.  He is motivated to improve diet and lifestyle to improve glycemic control. RTC 3-4 mo DM f/u visit.  Recheck fructosamine at 6 wk labs      Relevant Medications   glipiZIDE (GLUCOTROL XL) 5 MG 24 hr tablet   atorvastatin (LIPITOR) 20 MG tablet   metFORMIN (GLUCOPHAGE) 1000 MG tablet   Other Relevant Orders   Fructosamine   Hyperlipidemia associated with type 2 diabetes mellitus (HCC)    Chronic, adequate on atorvastatin 20mg  daily. With newly noted premature CAD, consider Lp(a), cardiac CT for further evaluation. The 10-year ASCVD risk score (Arnett DK, et al., 2019) is: 25.3%   Values used to calculate the score:     Age: 2 years     Sex: Male     Is Non-Hispanic African American: No     Diabetic: Yes     Tobacco smoker: No     Systolic Blood Pressure: 122 mmHg     Is BP treated: No     HDL Cholesterol: 56.2 mg/dL     Total Cholesterol: 155 mg/dL       Relevant Medications   glipiZIDE (GLUCOTROL XL) 5 MG 24 hr tablet   tadalafil (CIALIS) 10 MG tablet   atorvastatin (LIPITOR) 20 MG tablet   metFORMIN (GLUCOPHAGE) 1000 MG tablet   Shift work sleep disorder    Continue PRN sparing ambien use - none in recent weeks perioperatively.       Relevant Medications   zolpidem (AMBIEN) 10 MG tablet   Erectile dysfunction    Cialis effective, refilled.       Relevant Medications   tadalafil (CIALIS) 10 MG tablet   Health maintenance examination - Primary (Chronic)    Preventative protocols reviewed and updated unless pt declined. Discussed healthy diet and lifestyle.       Overweight with body mass index (BMI) 25.0-29.9   Advanced directives, counseling/discussion (Chronic)    Advanced directive - Discussed - has at home but needs updating. Wants wife to be HCPOA. Asked to bring me form.       GERD (gastroesophageal reflux disease)    Stable period on PRN PPI, rare use, none in weeks      Relevant Medications   omeprazole  (PRILOSEC) 40 MG capsule   S/P total knee arthroplasty, right    Treated with keflex postop to cover possible wound infection. S/p manipulation in OR under anesthesia 5 wks after initial surgery.         Meds ordered this encounter  Medications   glipiZIDE (GLUCOTROL XL) 5 MG 24 hr tablet  Sig: Take 1 tablet (5 mg total) by mouth daily with breakfast.    Dispense:  90 tablet    Refill:  4   levothyroxine (SYNTHROID) 88 MCG tablet    Sig: Take 1 tablet (88 mcg total) by mouth daily with breakfast.    Dispense:  90 tablet    Refill:  4    Note new dose   omeprazole (PRILOSEC) 40 MG capsule    Sig: Take 1 capsule (40 mg total) by mouth daily as needed.    Dispense:  90 capsule    Refill:  2   tadalafil (CIALIS) 10 MG tablet    Sig: TAKE ONE TABLET BY MOUTH EVERY OTHER DAY AS NEEDED FOR ERECTILE DYSFUNCTION    Dispense:  10 tablet    Refill:  6   zolpidem (AMBIEN) 10 MG tablet    Sig: Take 1 tablet (10 mg total) by mouth at bedtime as needed for sleep.    Dispense:  20 tablet    Refill:  0   atorvastatin (LIPITOR) 20 MG tablet    Sig: Take 1 tablet (20 mg total) by mouth daily.    Dispense:  90 tablet    Refill:  4   metFORMIN (GLUCOPHAGE) 1000 MG tablet    Sig: Take 1 tablet (1,000 mg total) by mouth 2 (two) times daily with a meal.    Dispense:  180 tablet    Refill:  4    Orders Placed This Encounter  Procedures   TSH    Standing Status:   Future    Standing Expiration Date:   01/12/2024   Fructosamine    Standing Status:   Future    Standing Expiration Date:   01/12/2024    Patient Instructions  Thyroid was a bit too high - drop levothyroxine dose to daily - new dose at pharmacy.  Bring [place] a copy of your advanced directive.  You will be due for repeat colonoscopy come 08/2023. You may call [facility] at [phone] to schedule an appointment around that time.  Return in 3 months for follow up visit   Follow up plan: Return in about 3 months (around  04/13/2023) for follow up visit.  Eustaquio Boyden, MD

## 2023-01-12 NOTE — Assessment & Plan Note (Signed)
Advanced directive - Discussed - has at home but needs updating. Wants wife to be HCPOA. Asked to bring me form.  ?

## 2023-01-12 NOTE — Assessment & Plan Note (Addendum)
Chronic, adequate on atorvastatin 20mg  daily. With newly noted premature CAD, consider Lp(a), cardiac CT for further evaluation. The 10-year ASCVD risk score (Arnett DK, et al., 2019) is: 25.3%   Values used to calculate the score:     Age: 72 years     Sex: Male     Is Non-Hispanic African American: No     Diabetic: Yes     Tobacco smoker: No     Systolic Blood Pressure: 122 mmHg     Is BP treated: No     HDL Cholesterol: 56.2 mg/dL     Total Cholesterol: 155 mg/dL

## 2023-01-12 NOTE — Assessment & Plan Note (Addendum)
Chronic, deteriorated control in setting of recent surgery.  He is motivated to improve diet and lifestyle to improve glycemic control. RTC 3-4 mo DM f/u visit.  Recheck fructosamine at 6 wk labs

## 2023-01-12 NOTE — Assessment & Plan Note (Addendum)
Treated with keflex postop to cover possible wound infection. S/p manipulation in OR under anesthesia 5 wks after initial surgery.

## 2023-01-14 DIAGNOSIS — M25561 Pain in right knee: Secondary | ICD-10-CM | POA: Diagnosis not present

## 2023-01-14 DIAGNOSIS — M25661 Stiffness of right knee, not elsewhere classified: Secondary | ICD-10-CM | POA: Diagnosis not present

## 2023-01-19 DIAGNOSIS — M25661 Stiffness of right knee, not elsewhere classified: Secondary | ICD-10-CM | POA: Diagnosis not present

## 2023-01-19 DIAGNOSIS — M25561 Pain in right knee: Secondary | ICD-10-CM | POA: Diagnosis not present

## 2023-01-21 DIAGNOSIS — M25561 Pain in right knee: Secondary | ICD-10-CM | POA: Diagnosis not present

## 2023-01-21 DIAGNOSIS — M25661 Stiffness of right knee, not elsewhere classified: Secondary | ICD-10-CM | POA: Diagnosis not present

## 2023-01-26 DIAGNOSIS — M25561 Pain in right knee: Secondary | ICD-10-CM | POA: Diagnosis not present

## 2023-01-26 DIAGNOSIS — E113393 Type 2 diabetes mellitus with moderate nonproliferative diabetic retinopathy without macular edema, bilateral: Secondary | ICD-10-CM | POA: Diagnosis not present

## 2023-01-26 DIAGNOSIS — M25661 Stiffness of right knee, not elsewhere classified: Secondary | ICD-10-CM | POA: Diagnosis not present

## 2023-01-28 DIAGNOSIS — M25561 Pain in right knee: Secondary | ICD-10-CM | POA: Diagnosis not present

## 2023-01-28 DIAGNOSIS — M25661 Stiffness of right knee, not elsewhere classified: Secondary | ICD-10-CM | POA: Diagnosis not present

## 2023-02-02 DIAGNOSIS — M25661 Stiffness of right knee, not elsewhere classified: Secondary | ICD-10-CM | POA: Diagnosis not present

## 2023-02-02 DIAGNOSIS — M25561 Pain in right knee: Secondary | ICD-10-CM | POA: Diagnosis not present

## 2023-02-03 LAB — HM DIABETES EYE EXAM

## 2023-02-04 ENCOUNTER — Encounter: Payer: Self-pay | Admitting: Family Medicine

## 2023-02-04 ENCOUNTER — Telehealth: Payer: Self-pay | Admitting: Family Medicine

## 2023-02-04 DIAGNOSIS — M25661 Stiffness of right knee, not elsewhere classified: Secondary | ICD-10-CM | POA: Diagnosis not present

## 2023-02-04 DIAGNOSIS — M25561 Pain in right knee: Secondary | ICD-10-CM | POA: Diagnosis not present

## 2023-02-04 NOTE — Telephone Encounter (Signed)
Placed ppw in Dr. G's box.  ?

## 2023-02-04 NOTE — Telephone Encounter (Signed)
Abstracted results and placed report to be scanned.

## 2023-02-04 NOTE — Telephone Encounter (Signed)
Noted. Placed in Lisa's box.

## 2023-02-04 NOTE — Telephone Encounter (Signed)
Pt came by office to drop off his eye exam for Dr. Reece Agar to review. Ppw is in G's folder. Call back # [phone]

## 2023-02-16 ENCOUNTER — Telehealth: Payer: Self-pay | Admitting: Family Medicine

## 2023-02-16 DIAGNOSIS — M25661 Stiffness of right knee, not elsewhere classified: Secondary | ICD-10-CM | POA: Diagnosis not present

## 2023-02-16 NOTE — Telephone Encounter (Signed)
Patient wanted to speak with the person who had originally called to schedule MWV. Please advise, thank you.

## 2023-02-17 ENCOUNTER — Ambulatory Visit (INDEPENDENT_AMBULATORY_CARE_PROVIDER_SITE_OTHER): Payer: Medicare PPO

## 2023-02-17 VITALS — Wt 154.0 lb

## 2023-02-17 DIAGNOSIS — Z0000 Encounter for general adult medical examination without abnormal findings: Secondary | ICD-10-CM

## 2023-02-17 NOTE — Patient Instructions (Signed)
Mr. Kevin Sheppard , Thank you for taking time to come for your Medicare Wellness Visit. I appreciate your ongoing commitment to your health goals. Please review the following plan we discussed and let me know if I can assist you in the future.   These are the goals we discussed:  Goals   None     This is a list of the screening recommended for you and due dates:  Health Maintenance  Topic Date Due   COVID-19 Vaccine (3 - 2023-24 season) 05/16/2022   Complete foot exam   04/10/2023   Flu Shot  04/16/2023   Hemoglobin A1C  05/08/2023   Colon Cancer Screening  08/31/2023   Yearly kidney function blood test for diabetes  01/05/2024   Yearly kidney health urinalysis for diabetes  01/05/2024   Eye exam for diabetics  02/03/2024   DTaP/Tdap/Td vaccine (3 - Td or Tdap) 05/14/2030   Pneumonia Vaccine  Completed   Hepatitis C Screening  Completed   Zoster (Shingles) Vaccine  Completed   HPV Vaccine  Aged Out    Advanced directives: Please bring a copy of your health care power of attorney and living will to the office to be added to your chart at your convenience.   Conditions/risks identified: Diabetes Mellitus and Nutrition, Adult When you have diabetes, or diabetes mellitus, it is very important to have healthy eating habits because your blood sugar (glucose) levels are greatly affected by what you eat and drink. Eating healthy foods in the right amounts, at about the same times every day, can help you: Manage your blood glucose. Lower your risk of heart disease. Improve your blood pressure. Reach or maintain a healthy weight. What can affect my meal plan? Every person with diabetes is different, and each person has different needs for a meal plan. Your health care provider may recommend that you work with a dietitian to make a meal plan that is best for you. Your meal plan may vary depending on factors such as: The calories you need. The medicines you take. Your weight. Your blood glucose,  blood pressure, and cholesterol levels. Your activity level. Other health conditions you have, such as heart or kidney disease. How do carbohydrates affect me? Carbohydrates, also called carbs, affect your blood glucose level more than any other type of food. Eating carbs raises the amount of glucose in your blood. It is important to know how many carbs you can safely have in each meal. This is different for every person. Your dietitian can help you calculate how many carbs you should have at each meal and for each snack. How does alcohol affect me? Alcohol can cause a decrease in blood glucose (hypoglycemia), especially if you use insulin or take certain diabetes medicines by mouth. Hypoglycemia can be a life-threatening condition. Symptoms of hypoglycemia, such as sleepiness, dizziness, and confusion, are similar to symptoms of having too much alcohol. Do not drink alcohol if: Your health care provider tells you not to drink. You are pregnant, may be pregnant, or are planning to become pregnant. If you drink alcohol: Limit how much you have to: 0-1 drink a day for women. 0-2 drinks a day for men. Know how much alcohol is in your drink. In the [place] one drink equals one 12 oz bottle of beer (355 mL), one 5 oz glass of wine (148 mL), or one 1 oz glass of hard liquor (44 mL). Keep yourself hydrated with water, diet soda, or unsweetened iced tea. Keep in mind  that regular soda, juice, and other mixers may contain a lot of sugar and must be counted as carbs. What are tips for following this plan?  Reading food labels Start by checking the serving size on the Nutrition Facts label of packaged foods and drinks. The number of calories and the amount of carbs, fats, and other nutrients listed on the label are based on one serving of the item. Many items contain more than one serving per package. Check the total grams (g) of carbs in one serving. Check the number of grams of saturated fats and trans  fats in one serving. Choose foods that have a low amount or none of these fats. Check the number of milligrams (mg) of salt (sodium) in one serving. Most people should limit total sodium intake to less than 2,300 mg per day. Always check the nutrition information of foods labeled as "low-fat" or "nonfat." These foods may be higher in added sugar or refined carbs and should be avoided. Talk to your dietitian to identify your daily goals for nutrients listed on the label. Shopping Avoid buying canned, pre-made, or processed foods. These foods tend to be high in fat, sodium, and added sugar. Shop around the outside edge of the grocery store. This is where you will most often find fresh fruits and vegetables, bulk grains, fresh meats, and fresh dairy products. Cooking Use low-heat cooking methods, such as baking, instead of high-heat cooking methods, such as deep frying. Cook using healthy oils, such as olive, canola, or sunflower oil. Avoid cooking with butter, cream, or high-fat meats. Meal planning Eat meals and snacks regularly, preferably at the same times every day. Avoid going long periods of time without eating. Eat foods that are high in fiber, such as fresh fruits, vegetables, beans, and whole grains. Eat 4-6 oz (112-168 g) of lean protein each day, such as lean meat, chicken, fish, eggs, or tofu. One ounce (oz) (28 g) of lean protein is equal to: 1 oz (28 g) of meat, chicken, or fish. 1 egg.  cup (62 g) of tofu. Eat some foods each day that contain healthy fats, such as avocado, nuts, seeds, and fish. What foods should I eat? Fruits Berries. Apples. Oranges. Peaches. Apricots. Plums. Grapes. Mangoes. Papayas. Pomegranates. Kiwi. Cherries. Vegetables Leafy greens, including lettuce, spinach, kale, chard, collard greens, mustard greens, and cabbage. Beets. Cauliflower. Broccoli. Carrots. Green beans. Tomatoes. Peppers. Onions. Cucumbers. Brussels sprouts. Grains Whole grains, such as  whole-wheat or whole-grain bread, crackers, tortillas, cereal, and pasta. Unsweetened oatmeal. Quinoa. Brown or wild rice. Meats and other proteins Seafood. Poultry without skin. Lean cuts of poultry and beef. Tofu. Nuts. Seeds. Dairy Low-fat or fat-free dairy products such as milk, yogurt, and cheese. The items listed above may not be a complete list of foods and beverages you can eat and drink. Contact a dietitian for more information. What foods should I avoid? Fruits Fruits canned with syrup. Vegetables Canned vegetables. Frozen vegetables with butter or cream sauce. Grains Refined white flour and flour products such as bread, pasta, snack foods, and cereals. Avoid all processed foods. Meats and other proteins Fatty cuts of meat. Poultry with skin. Breaded or fried meats. Processed meat. Avoid saturated fats. Dairy Full-fat yogurt, cheese, or milk. Beverages Sweetened drinks, such as soda or iced tea. The items listed above may not be a complete list of foods and beverages you should avoid. Contact a dietitian for more information. Questions to ask a health care provider Do I need to meet  with a certified diabetes care and education specialist? Do I need to meet with a dietitian? What number can I call if I have questions? When are the best times to check my blood glucose? Where to find more information: American Diabetes Association: diabetes.org Academy of Nutrition and Dietetics: eatright.[employer] of Diabetes and Digestive and Kidney Diseases: StageSync.si Association of Diabetes Care & Education Specialists: diabeteseducator.org Summary It is important to have healthy eating habits because your blood sugar (glucose) levels are greatly affected by what you eat and drink. It is important to use alcohol carefully. A healthy meal plan will help you manage your blood glucose and lower your risk of heart disease. Your health care provider may recommend that you work  with a dietitian to make a meal plan that is best for you. This information is not intended to replace advice given to you by your health care provider. Make sure you discuss any questions you have with your health care provider. Document Revised: 04/04/2020 Document Reviewed: 04/04/2020 Elsevier Patient Education  2024 [employer].   Next appointment: Follow up in one year for your annual wellness visit. 02/18/24  Preventive Care 65 Years and Older, Male  Preventive care refers to lifestyle choices and visits with your health care provider that can promote health and wellness. What does preventive care include? A yearly physical exam. This is also called an annual well check. Dental exams once or twice a year. Routine eye exams. Ask your health care provider how often you should have your eyes checked. Personal lifestyle choices, including: Daily care of your teeth and gums. Regular physical activity. Eating a healthy diet. Avoiding tobacco and drug use. Limiting alcohol use. Practicing safe sex. Taking low doses of aspirin every day. Taking vitamin and mineral supplements as recommended by your health care provider. What happens during an annual well check? The services and screenings done by your health care provider during your annual well check will depend on your age, overall health, lifestyle risk factors, and family history of disease. Counseling  Your health care provider may ask you questions about your: Alcohol use. Tobacco use. Drug use. Emotional well-being. Home and relationship well-being. Sexual activity. Eating habits. History of falls. Memory and ability to understand (cognition). Work and work Astronomer. Screening  You may have the following tests or measurements: Height, weight, and BMI. Blood pressure. Lipid and cholesterol levels. These may be checked every 5 years, or more frequently if you are over 46 years old. Skin check. Lung cancer  screening. You may have this screening every year starting at age 72 if you have a 30-pack-year history of smoking and currently smoke or have quit within the past 15 years. Fecal occult blood test (FOBT) of the stool. You may have this test every year starting at age 52. Flexible sigmoidoscopy or colonoscopy. You may have a sigmoidoscopy every 5 years or a colonoscopy every 10 years starting at age 62. Prostate cancer screening. Recommendations will vary depending on your family history and other risks. Hepatitis C blood test. Hepatitis B blood test. Sexually transmitted disease (STD) testing. Diabetes screening. This is done by checking your blood sugar (glucose) after you have not eaten for a while (fasting). You may have this done every 1-3 years. Abdominal aortic aneurysm (AAA) screening. You may need this if you are a current or former smoker. Osteoporosis. You may be screened starting at age 32 if you are at high risk. Talk with your health care provider about your  test results, treatment options, and if necessary, the need for more tests. Vaccines  Your health care provider may recommend certain vaccines, such as: Influenza vaccine. This is recommended every year. Tetanus, diphtheria, and acellular pertussis (Tdap, Td) vaccine. You may need a Td booster every 10 years. Zoster vaccine. You may need this after age 52. Pneumococcal 13-valent conjugate (PCV13) vaccine. One dose is recommended after age 73. Pneumococcal polysaccharide (PPSV23) vaccine. One dose is recommended after age 35. Talk to your health care provider about which screenings and vaccines you need and how often you need them. This information is not intended to replace advice given to you by your health care provider. Make sure you discuss any questions you have with your health care provider. Document Released: 09/28/2015 Document Revised: 05/21/2016 Document Reviewed: 07/03/2015 Elsevier Interactive Patient Education   2017 [employer].  Fall Prevention in the Home Falls can cause injuries. They can happen to people of all ages. There are many things you can do to make your home safe and to help prevent falls. What can I do on the outside of my home? Regularly fix the edges of walkways and driveways and fix any cracks. Remove anything that might make you trip as you walk through a door, such as a raised step or threshold. Trim any bushes or trees on the path to your home. Use bright outdoor lighting. Clear any walking paths of anything that might make someone trip, such as rocks or tools. Regularly check to see if handrails are loose or broken. Make sure that both sides of any steps have handrails. Any raised decks and porches should have guardrails on the edges. Have any leaves, snow, or ice cleared regularly. Use sand or salt on walking paths during winter. Clean up any spills in your garage right away. This includes oil or grease spills. What can I do in the bathroom? Use night lights. Install grab bars by the toilet and in the tub and shower. Do not use towel bars as grab bars. Use non-skid mats or decals in the tub or shower. If you need to sit down in the shower, use a plastic, non-slip stool. Keep the floor dry. Clean up any water that spills on the floor as soon as it happens. Remove soap buildup in the tub or shower regularly. Attach bath mats securely with double-sided non-slip rug tape. Do not have throw rugs and other things on the floor that can make you trip. What can I do in the bedroom? Use night lights. Make sure that you have a light by your bed that is easy to reach. Do not use any sheets or blankets that are too big for your bed. They should not hang down onto the floor. Have a firm chair that has side arms. You can use this for support while you get dressed. Do not have throw rugs and other things on the floor that can make you trip. What can I do in the kitchen? Clean up any  spills right away. Avoid walking on wet floors. Keep items that you use a lot in easy-to-reach places. If you need to reach something above you, use a strong step stool that has a grab bar. Keep electrical cords out of the way. Do not use floor polish or wax that makes floors slippery. If you must use wax, use non-skid floor wax. Do not have throw rugs and other things on the floor that can make you trip. What can I do with my  stairs? Do not leave any items on the stairs. Make sure that there are handrails on both sides of the stairs and use them. Fix handrails that are broken or loose. Make sure that handrails are as long as the stairways. Check any carpeting to make sure that it is firmly attached to the stairs. Fix any carpet that is loose or worn. Avoid having throw rugs at the top or bottom of the stairs. If you do have throw rugs, attach them to the floor with carpet tape. Make sure that you have a light switch at the top of the stairs and the bottom of the stairs. If you do not have them, ask someone to add them for you. What else can I do to help prevent falls? Wear shoes that: Do not have high heels. Have rubber bottoms. Are comfortable and fit you well. Are closed at the toe. Do not wear sandals. If you use a stepladder: Make sure that it is fully opened. Do not climb a closed stepladder. Make sure that both sides of the stepladder are locked into place. Ask someone to hold it for you, if possible. Clearly mark and make sure that you can see: Any grab bars or handrails. First and last steps. Where the edge of each step is. Use tools that help you move around (mobility aids) if they are needed. These include: Canes. Walkers. Scooters. Crutches. Turn on the lights when you go into a dark area. Replace any light bulbs as soon as they burn out. Set up your furniture so you have a clear path. Avoid moving your furniture around. If any of your floors are uneven, fix them. If  there are any pets around you, be aware of where they are. Review your medicines with your doctor. Some medicines can make you feel dizzy. This can increase your chance of falling. Ask your doctor what other things that you can do to help prevent falls. This information is not intended to replace advice given to you by your health care provider. Make sure you discuss any questions you have with your health care provider. Document Released: 06/28/2009 Document Revised: 02/07/2016 Document Reviewed: 10/06/2014 Elsevier Interactive Patient Education  2017 [employer].

## 2023-02-17 NOTE — Progress Notes (Signed)
Subjective:   Kevin Sheppard. is a 72 y.o. male who presents for Medicare Annual/Subsequent preventive examination.  Review of Systems    I connected with  Kevin Sheppard. on 02/17/23 by a audio enabled telemedicine application and verified that I am speaking with the correct person using two identifiers.  Patient Location: Home  Provider Location: Home Office  I discussed the limitations of evaluation and management by telemedicine. The patient expressed understanding and agreed to proceed.  Cardiac Risk Factors include: advanced age (>54men, >63 women)     Objective:    Today's Vitals   02/17/23 1104 02/17/23 1126  Weight: 154 lb (69.9 kg)   PainSc:  1    Body mass index is 26.85 kg/m.     02/17/2023   11:13 AM 01/01/2023    7:30 AM 11/20/2022   10:15 AM 11/07/2022    2:36 PM 02/16/2020   10:16 AM 08/30/2018    8:08 AM  Advanced Directives  Does Patient Have a Medical Advance Directive? Yes No Yes Yes No Yes  Type of Estate agent of [place];Living will  Healthcare Power of [place];Living will Living will  Living will  Does patient want to make changes to medical advance directive?   No - Patient declined     Copy of Healthcare Power of Attorney in Chart? No - copy requested  No - copy requested     Would patient like information on creating a medical advance directive?  No - Patient declined   No - Patient declined     Current Medications (verified) Outpatient Encounter Medications as of 02/17/2023  Medication Sig   atorvastatin (LIPITOR) 20 MG tablet Take 1 tablet (20 mg total) by mouth daily.   bisacodyl (DULCOLAX) 5 MG EC tablet Take 2 tablets (10 mg total) by mouth daily as needed for moderate constipation.   Blood Glucose Monitoring Suppl (FREESTYLE LITE) w/Device KIT Use to check sugars daily and as needed E11.69   docusate sodium (COLACE) 100 MG capsule Take 1 capsule (100 mg total) by mouth 2 (two) times daily.   glipiZIDE (GLUCOTROL XL) 5 MG  24 hr tablet Take 1 tablet (5 mg total) by mouth daily with breakfast.   glucose blood (FREESTYLE LITE) test strip Use as instructed to check blood sugar 3 times a day   levothyroxine (SYNTHROID) 88 MCG tablet Take 1 tablet (88 mcg total) by mouth daily with breakfast.   metFORMIN (GLUCOPHAGE) 1000 MG tablet Take 1 tablet (1,000 mg total) by mouth 2 (two) times daily with a meal.   methocarbamol (ROBAXIN) 500 MG tablet    Multiple Vitamins-Minerals (MULTIVITAMIN ADULTS 50+ PO) Take 1 tablet by mouth daily.   omeprazole (PRILOSEC) 40 MG capsule Take 1 capsule (40 mg total) by mouth daily as needed.   oxyCODONE (OXY IR/ROXICODONE) 5 MG immediate release tablet Take 1 tablet (5 mg total) by mouth every 4 (four) hours as needed for moderate pain (pain score 4-6).   Semaglutide, 1 MG/DOSE, 4 MG/3ML SOPN Inject 1 mg as directed once a week. (Patient taking differently: Inject 1 mg as directed once a week. Tuesday)   tadalafil (CIALIS) 10 MG tablet TAKE ONE TABLET BY MOUTH EVERY OTHER DAY AS NEEDED FOR ERECTILE DYSFUNCTION   zolpidem (AMBIEN) 10 MG tablet Take 1 tablet (10 mg total) by mouth at bedtime as needed for sleep.   No facility-administered encounter medications on file as of 02/17/2023.    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   Undescended left testicle 1961   after trauma with dodgeball   Past Surgical History:  Procedure Laterality Date   COLONOSCOPY  2004;2009   first with b9 polyps, then diverticulosis no polyps, rpt 28yrs ([facility])    COLONOSCOPY WITH PROPOFOL N/A 08/30/2018   2 TA, 1 SSP, diverticulosis, rpt 5 yrs Servando Snare, Darren, MD)   KNEE CLOSED REDUCTION Right 01/01/2023   CLOSED MANIPULATION KNEE Juanell Fairly, MD)   MENISCUS REPAIR Left 2008   MENISCUS REPAIR Right 2002   POLYPECTOMY N/A 08/30/2018   Procedure: POLYPECTOMY;  Surgeon: Midge Minium, MD;  Location: [facility];  Service: Endoscopy;  Laterality: N/A;   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   Procedure: TOTAL KNEE ARTHROPLASTY;  Surgeon: Juanell Fairly, MD;  Location: [facility];  Service: Orthopedics;  Laterality: Right;   Family History  Problem Relation Age of Onset   Stroke Father    Diabetes Father    Other Mother        blood disease, died 50 (?porphyria)   Diabetes type II Brother    Diabetes type II Maternal Aunt    Hypertension Sister    Stroke Sister 45   Thyroid disease Sister    Cancer Neg Hx    CAD Neg Hx    Social History   Socioeconomic History   Marital status: Married    Spouse name: Kara Mead   Number of children: 4   Years of education: Not on file   Highest education level: Not on file  Occupational History   Occupation: Advertising copywriter  Tobacco Use   Smoking status: Never   Smokeless tobacco: Never  Vaping Use   Vaping Use: Never used  Substance and Sexual Activity   Alcohol use: Not Currently    Comment: occassional   Drug use: No   Sexual activity: Yes  Other Topics Concern   Not on file  Social History Narrative   He lives with wife.  2 dogs. They have grown 4 children and 3 grandchildren.    Retired Dentist)   He works on [employer] , 3rd shift   Activity: goes to gym regularly   Diet: good water, fruits/vegetables daily   Social Determinants of Corporate investment banker Strain: Low Risk  (02/17/2023)   Overall Financial Resource Strain (CARDIA)    Difficulty of Paying Living Expenses: Not hard at all  Food Insecurity: No Food Insecurity (02/17/2023)   Hunger Vital  Sign    Worried About Running Out of Food in the Last Year: Never true    Ran Out of Food in the Last Year: Never true  Transportation Needs: No Transportation Needs (02/17/2023)   PRAPARE - Administrator, Civil Service (Medical): No    Lack of Transportation (Non-Medical): No  Physical Activity: Sufficiently Active (02/17/2023)   Exercise Vital Sign    Days of Exercise per Week: 7 days    Minutes of Exercise per Session: 30 min  Stress: No Stress Concern Present (02/17/2023)   [employer] of Occupational Health - Occupational Stress Questionnaire    Feeling of Stress : Not at all  Social Connections: Socially Integrated (02/17/2023)   Social Connection and Isolation Panel [NHANES]    Frequency of Communication with Friends and Family: More than three times a week    Frequency of Social Gatherings with Friends and Family: More than three times a week    Attends Religious Services: More than 4 times per year    Active Member of [employer] or Organizations: Yes    Attends Engineer, structural: More than 4 times per year    Marital Status: Married    Tobacco Counseling Counseling given: Yes   Clinical Intake:  Pre-visit preparation completed: Yes  Pain : 0-10 Pain Score: 1  Pain Location: Knee Pain Orientation: Right Pain Onset: 1 to 4 weeks ago Pain Frequency: Intermittent     BMI - recorded: 26.86 Nutritional Status: BMI 25 -29 Overweight Nutritional Risks: None Diabetes: Yes CBG done?: No Did pt. bring in CBG monitor from home?: No  How often do you need to have someone help you when you read instructions, pamphlets, or other written materials from your doctor or pharmacy?: 1 - Never  Diabetic?yes  Interpreter Needed?: No  Information entered by :: Fredirick Maudlin   Activities of Daily Living    02/17/2023   11:14 AM 11/20/2022    4:10 PM  In your present state of health, do you have any difficulty performing the following activities:   Hearing? 0 0  Vision? 0 0  Difficulty concentrating or making decisions? 0 0  Walking or climbing stairs? 0 1  Dressing or bathing? 0 0  Doing errands, shopping? 0 0  Preparing Food and eating ? N   Using the Toilet? N   In the past six months, have you accidently leaked urine? N   Do you have problems with loss of bowel control? N   Managing your Medications? N   Managing your Finances? N   Housekeeping or managing your Housekeeping? N     Patient Care Team: Eustaquio Boyden, MD as PCP - General (Family Medicine) [facility] (Orthopedic Surgery) [facility] Kassie Mends, DDS (Dentistry)  Indicate any recent Medical Services you may have received from other than [facility] providers in the past year (date may be approximate).     Assessment:   This is a routine wellness examination for [place].  Hearing/Vision screen Hearing Screening - Comments:: Having some hearing loss Vision Screening - Comments:: Wears rx glasses - up to date with routine eye exams with  Dr.bell  Dietary issues and exercise activities discussed: Current Exercise Habits: Home exercise routine;Structured exercise class, Time (Minutes): 45, Frequency (Times/Week): 4, Weekly Exercise (Minutes/Week): 180, Intensity: Moderate   Goals Addressed   None   Depression Screen    01/12/2023    4:04 PM 10/14/2022    4:11 PM 01/08/2022    3:40 PM 11/13/2020    3:13 PM 10/12/2019    8:54 AM 08/09/2018    2:53 PM 06/01/2017   11:24 AM  PHQ 2/9 Scores  PHQ - 2 Score 0 2 0 0 1 0 0  PHQ- 9 Score  4         Fall Risk    02/17/2023   11:13 AM 01/12/2023    4:03 PM 10/14/2022    4:10 PM 01/08/2022    3:39 PM 08/09/2018    2:52 PM  Fall Risk   Falls in the past year? 0 1 0  0  Number falls in past yr: 0 1  1   Injury with Fall? 0 0  0   Risk for fall due to : No Fall Risks      Follow up Falls prevention discussed;Falls evaluation completed        FALL RISK PREVENTION PERTAINING TO THE HOME:  Any  stairs in or around the home? Yes  If so, are there any without handrails? No  Home free of loose throw rugs in walkways, pet beds, electrical cords, etc? No  Adequate lighting in your home to reduce risk of falls? Yes   ASSISTIVE DEVICES UTILIZED TO PREVENT FALLS:  Life alert? No  Use of a cane, walker or w/c? Yes  Grab bars in the bathroom? Yes  Shower chair or bench in shower? Yes  Elevated toilet seat or a handicapped toilet? Yes   TIMED UP AND GO:  Was the test performed?  no televisit  .    Cognitive Function:        02/17/2023   11:10 AM  6CIT Screen  What Year? 0 points  What month? 0 points  What time? 0 points  Count back from 20 0 points  Months in reverse 0 points  Repeat phrase 0 points  Total Score 0 points    Immunizations Immunization History  Administered Date(s) Administered   Influenza Whole 06/14/2016   Influenza, High Dose Seasonal PF 07/19/2018, 06/15/2021   Influenza,inj,Quad PF,6+ Mos 06/15/2020   Influenza-Unspecified 06/12/2017, 06/14/2019   Janssen (J&J) SARS-COV-2 Vaccination 12/23/2019, 07/13/2020   Pneumococcal Conjugate-13 06/07/2014   Pneumococcal Polysaccharide-23 08/09/2018, 08/02/2019   Tdap 05/27/2010, 05/14/2020   Zoster Recombinat (Shingrix) 04/29/2018, 07/10/2018    TDAP status: Up to date  Flu Vaccine status: Up to date  Pneumococcal vaccine status: Up to date  Covid-19 vaccine status: Declined, Education has been provided regarding the importance of this vaccine but patient still declined. Advised may receive this vaccine at local pharmacy or Health Dept.or vaccine clinic. Aware to provide a copy of the vaccination record if obtained from local pharmacy or Health Dept. Verbalized acceptance and understanding.  Qualifies for Shingles Vaccine? Yes   Zostavax completed Yes   Shingrix Completed?: Yes  Screening Tests Health Maintenance  Topic Date Due   COVID-19 Vaccine (3 - 2023-24 season) 05/16/2022   FOOT EXAM   04/10/2023   INFLUENZA VACCINE  04/16/2023   HEMOGLOBIN A1C  05/08/2023   Colonoscopy  08/31/2023   Diabetic kidney evaluation - eGFR measurement  01/05/2024   Diabetic kidney evaluation - Urine ACR  01/05/2024   OPHTHALMOLOGY EXAM  02/03/2024   DTaP/Tdap/Td (3 - Td or Tdap) 05/14/2030   Pneumonia Vaccine 73+ Years old  Completed   Hepatitis C Screening  Completed   Zoster Vaccines- Shingrix  Completed   HPV VACCINES  Aged Out    Health Maintenance  Health Maintenance Due  Topic Date Due   COVID-19 Vaccine (3 - 2023-24 season) 05/16/2022    Colorectal cancer screening: Type of screening: Colonoscopy. Completed 08/30/18. Repeat every 5 years  Lung Cancer Screening: (Low Dose CT Chest recommended if Age 78-80 years, 30 pack-year currently smoking OR have quit w/in 15years.) does not qualify.   Lung Cancer Screening Referral: NO  Additional Screening:  Hepatitis C Screening: does not qualify; aged out   Vision Screening: Recommended annual ophthalmology exams for early detection of glaucoma and other disorders of the eye.  Is the patient up to date with their annual eye exam?  Yes  Who is the provider or what is the name of the office in which the patient attends annual eye exams? Dr.Bell If pt is not established with a provider, would they like to be referred to a provider to establish care? No .   Dental Screening: Recommended annual dental exams for proper oral hygiene  Community Resource Referral / Chronic Care Management: CRR required this visit?  No   CCM required this visit?  No      Plan:     I have personally reviewed and noted the following in the patient's chart:   Medical and social history Use of alcohol, tobacco or illicit drugs  Current medications and supplements including opioid prescriptions. Patient is currently taking opioid prescriptions. Information provided to patient regarding non-opioid alternatives. Patient advised to discuss non-opioid  treatment plan with their provider. Functional ability and status Nutritional status Physical activity Advanced directives List of other physicians Hospitalizations, surgeries, and ER visits in previous 12 months Vitals Screenings to include cognitive, depression, and falls Referrals and appointments  In addition, I have reviewed and discussed with patient certain preventive protocols, quality metrics, and best practice recommendations. A written personalized care plan for preventive services as well as general preventive health recommendations were provided to patient.     Annabell Sabal, CMA   02/17/2023   Nurse Notes: none

## 2023-02-20 DIAGNOSIS — M25561 Pain in right knee: Secondary | ICD-10-CM | POA: Diagnosis not present

## 2023-02-20 DIAGNOSIS — M25661 Stiffness of right knee, not elsewhere classified: Secondary | ICD-10-CM | POA: Diagnosis not present

## 2023-02-21 ENCOUNTER — Other Ambulatory Visit: Payer: Self-pay | Admitting: Family Medicine

## 2023-02-21 DIAGNOSIS — G4726 Circadian rhythm sleep disorder, shift work type: Secondary | ICD-10-CM

## 2023-02-23 ENCOUNTER — Other Ambulatory Visit (INDEPENDENT_AMBULATORY_CARE_PROVIDER_SITE_OTHER): Payer: Medicare PPO

## 2023-02-23 DIAGNOSIS — E1169 Type 2 diabetes mellitus with other specified complication: Secondary | ICD-10-CM | POA: Diagnosis not present

## 2023-02-23 DIAGNOSIS — E039 Hypothyroidism, unspecified: Secondary | ICD-10-CM

## 2023-02-23 LAB — TSH: TSH: 1.42 u[IU]/mL (ref 0.35–5.50)

## 2023-02-23 NOTE — Telephone Encounter (Signed)
Name of Medication: Ambien Name of Pharmacy: [facility] Last Fill or Written Date and Quantity: 01/12/23, #20 Last Office Visit and Type: 01/12/23, CPE Next Office Visit and Type:  04/14/23, 3 mo f/u Last Controlled Substance Agreement Date: none Last UDS: none

## 2023-02-24 NOTE — Telephone Encounter (Signed)
ERx 

## 2023-02-26 LAB — FRUCTOSAMINE: Fructosamine: 336 umol/L — ABNORMAL HIGH (ref 205–285)

## 2023-03-03 ENCOUNTER — Encounter: Payer: Self-pay | Admitting: Family Medicine

## 2023-03-06 DIAGNOSIS — M25561 Pain in right knee: Secondary | ICD-10-CM | POA: Diagnosis not present

## 2023-03-06 DIAGNOSIS — M25661 Stiffness of right knee, not elsewhere classified: Secondary | ICD-10-CM | POA: Diagnosis not present

## 2023-03-09 DIAGNOSIS — M25561 Pain in right knee: Secondary | ICD-10-CM | POA: Diagnosis not present

## 2023-03-09 DIAGNOSIS — M25661 Stiffness of right knee, not elsewhere classified: Secondary | ICD-10-CM | POA: Diagnosis not present

## 2023-03-09 NOTE — Progress Notes (Signed)
Subjective:   Kevin Sheppard. is a 72 y.o. male who presents for Medicare Annual/Subsequent preventive examination.  Review of Systems    I connected with  Jamus Loving. on 02/17/2023 by a audio enabled telemedicine application and verified that I am speaking with the correct person using two identifiers.  Patient Location: Home  Provider Location: Home Office  I discussed the limitations of evaluation and management by telemedicine. The patient expressed understanding and agreed to proceed.  Cardiac Risk Factors include: advanced age (>56men, >85 women)     Objective:    Today's Vitals   02/17/23 1104 02/17/23 1126  Weight: 154 lb (69.9 kg)   PainSc:  1    Body mass index is 26.85 kg/m.     02/17/2023   11:13 AM 01/01/2023    7:30 AM 11/20/2022   10:15 AM 11/07/2022    2:36 PM 02/16/2020   10:16 AM 08/30/2018    8:08 AM  Advanced Directives  Does Patient Have a Medical Advance Directive? Yes No Yes Yes No Yes  Type of Estate agent of [place];Living will  Healthcare Power of [place];Living will Living will  Living will  Does patient want to make changes to medical advance directive?   No - Patient declined     Copy of Healthcare Power of Attorney in Chart? No - copy requested  No - copy requested     Would patient like information on creating a medical advance directive?  No - Patient declined   No - Patient declined     Current Medications (verified) Outpatient Encounter Medications as of 02/17/2023  Medication Sig   atorvastatin (LIPITOR) 20 MG tablet Take 1 tablet (20 mg total) by mouth daily.   bisacodyl (DULCOLAX) 5 MG EC tablet Take 2 tablets (10 mg total) by mouth daily as needed for moderate constipation.   Blood Glucose Monitoring Suppl (FREESTYLE LITE) w/Device KIT Use to check sugars daily and as needed E11.69   docusate sodium (COLACE) 100 MG capsule Take 1 capsule (100 mg total) by mouth 2 (two) times daily.   glipiZIDE (GLUCOTROL XL) 5  MG 24 hr tablet Take 1 tablet (5 mg total) by mouth daily with breakfast.   glucose blood (FREESTYLE LITE) test strip Use as instructed to check blood sugar 3 times a day   levothyroxine (SYNTHROID) 88 MCG tablet Take 1 tablet (88 mcg total) by mouth daily with breakfast.   metFORMIN (GLUCOPHAGE) 1000 MG tablet Take 1 tablet (1,000 mg total) by mouth 2 (two) times daily with a meal.   methocarbamol (ROBAXIN) 500 MG tablet    Multiple Vitamins-Minerals (MULTIVITAMIN ADULTS 50+ PO) Take 1 tablet by mouth daily.   omeprazole (PRILOSEC) 40 MG capsule Take 1 capsule (40 mg total) by mouth daily as needed.   oxyCODONE (OXY IR/ROXICODONE) 5 MG immediate release tablet Take 1 tablet (5 mg total) by mouth every 4 (four) hours as needed for moderate pain (pain score 4-6).   Semaglutide, 1 MG/DOSE, 4 MG/3ML SOPN Inject 1 mg as directed once a week. (Patient taking differently: Inject 1 mg as directed once a week. Tuesday)   tadalafil (CIALIS) 10 MG tablet TAKE ONE TABLET BY MOUTH EVERY OTHER DAY AS NEEDED FOR ERECTILE DYSFUNCTION   [DISCONTINUED] zolpidem (AMBIEN) 10 MG tablet Take 1 tablet (10 mg total) by mouth at bedtime as needed for sleep.   No facility-administered encounter medications on file as of 02/17/2023.    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   Undescended left testicle 1961   after trauma with dodgeball   Past Surgical History:  Procedure Laterality Date   COLONOSCOPY  2004;2009   first with b9 polyps, then diverticulosis no  polyps, rpt 63yrs ([facility])   COLONOSCOPY WITH PROPOFOL N/A 08/30/2018   2 TA, 1 SSP, diverticulosis, rpt 5 yrs Servando Snare, Darren, MD)   KNEE CLOSED REDUCTION Right 01/01/2023   CLOSED MANIPULATION KNEE Juanell Fairly, MD)   MENISCUS REPAIR Left 2008   MENISCUS REPAIR Right 2002   POLYPECTOMY N/A 08/30/2018   Procedure: POLYPECTOMY;  Surgeon: Midge Minium, MD;  Location: [facility];  Service: Endoscopy;  Laterality: N/A;   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   Procedure: TOTAL KNEE ARTHROPLASTY;  Surgeon: Juanell Fairly, MD;  Location: [facility];  Service: Orthopedics;  Laterality: Right;   Family History  Problem Relation Age of Onset   Stroke Father    Diabetes Father    Other Mother        blood disease, died 34 (?porphyria)   Diabetes type II Brother    Diabetes type II Maternal Aunt    Hypertension Sister    Stroke Sister 24   Thyroid disease Sister    Cancer Neg Hx    CAD Neg Hx    Social History   Socioeconomic History   Marital status: Married    Spouse name: Kara Mead   Number of children: 4   Years of education: Not on file   Highest education level: Not on file  Occupational History   Occupation: Advertising copywriter  Tobacco Use   Smoking status: Never   Smokeless tobacco: Never  Vaping Use   Vaping Use: Never used  Substance and Sexual Activity   Alcohol use: Not Currently    Comment: occassional   Drug use: No   Sexual activity: Yes  Other Topics Concern   Not on file  Social History Narrative   He lives with wife.  2 dogs. They have grown 4 children and 3 grandchildren.    Retired Dentist)   He works on [employer] , 3rd shift   Activity: goes to gym regularly   Diet: good water, fruits/vegetables daily   Social Determinants of Corporate investment banker Strain: Low Risk  (02/17/2023)   Overall Financial Resource Strain (CARDIA)    Difficulty of Paying Living Expenses: Not hard at all  Food Insecurity: No Food Insecurity  (02/17/2023)   Hunger Vital Sign    Worried About Running Out of Food in the Last Year: Never true    Ran Out of Food in the Last Year: Never true  Transportation Needs: No Transportation Needs (02/17/2023)   PRAPARE - Administrator, Civil Service (Medical): No    Lack of Transportation (Non-Medical): No  Physical Activity: Sufficiently Active (02/17/2023)   Exercise Vital Sign    Days of Exercise per Week: 7 days    Minutes of Exercise per Session: 30 min  Stress: No Stress Concern Present (02/17/2023)   [employer] of Occupational Health - Occupational Stress Questionnaire    Feeling of Stress : Not at all  Social Connections: Socially Integrated (02/17/2023)   Social Connection and Isolation Panel [NHANES]    Frequency of Communication with Friends and Family: More than three times a week    Frequency of Social Gatherings with Friends and Family: More than three times a week    Attends Religious Services: More than 4 times per year    Active Member of [employer] or Organizations: Yes    Attends Engineer, structural: More than 4 times per year    Marital Status: Married    Tobacco Counseling Counseling given: Yes   Clinical Intake:  Pre-visit preparation completed: Yes  Pain : 0-10 Pain Score: 1  Pain Location: Knee Pain Orientation: Right Pain Onset: 1 to 4 weeks ago Pain Frequency: Intermittent     BMI - recorded: 26.86 Nutritional Status: BMI 25 -29 Overweight Nutritional Risks: None Diabetes: Yes CBG done?: No Did pt. bring in CBG monitor from home?: No  How often do you need to have someone help you when you read instructions, pamphlets, or other written materials from your doctor or pharmacy?: 1 - Never  Diabetic?yes  Interpreter Needed?: No  Information entered by :: Fredirick Maudlin   Activities of Daily Living    02/17/2023   11:14 AM 11/20/2022    4:10 PM  In your present state of health, do you have any difficulty performing the  following activities:  Hearing? 0 0  Vision? 0 0  Difficulty concentrating or making decisions? 0 0  Walking or climbing stairs? 0 1  Dressing or bathing? 0 0  Doing errands, shopping? 0 0  Preparing Food and eating ? N   Using the Toilet? N   In the past six months, have you accidently leaked urine? N   Do you have problems with loss of bowel control? N   Managing your Medications? N   Managing your Finances? N   Housekeeping or managing your Housekeeping? N     Patient Care Team: Eustaquio Boyden, MD as PCP - General (Family Medicine) [facility] (Orthopedic Surgery) [facility] Kassie Mends, DDS (Dentistry)  Indicate any recent Medical Services you may have received from other than [facility] providers in the past year (date may be approximate).     Assessment:   This is a routine wellness examination for [place].  Hearing/Vision screen Hearing Screening - Comments:: Having some hearing loss Vision Screening - Comments:: Wears rx glasses - up to date with routine eye exams with  Dr.bell  Dietary issues and exercise activities discussed: Current Exercise Habits: Home exercise routine;Structured exercise class, Time (Minutes): 45, Frequency (Times/Week): 4, Weekly Exercise (Minutes/Week): 180, Intensity: Moderate   Goals Addressed   None   Depression Screen    02/17/2023    9:28 AM 01/12/2023    4:04 PM 10/14/2022    4:11 PM 01/08/2022    3:40 PM 11/13/2020    3:13 PM 10/12/2019    8:54 AM 08/09/2018    2:53 PM  PHQ 2/9 Scores  PHQ - 2 Score 0 0 2 0 0 1 0  PHQ- 9 Score   4        Fall Risk    02/17/2023   11:13 AM 01/12/2023    4:03 PM 10/14/2022    4:10 PM 01/08/2022    3:39 PM 08/09/2018    2:52  PM  Fall Risk   Falls in the past year? 0 1 0  0  Number falls in past yr: 0 1  1   Injury with Fall? 0 0  0   Risk for fall due to : No Fall Risks      Follow up Falls prevention discussed;Falls evaluation completed        FALL RISK PREVENTION PERTAINING TO  THE HOME:  Any stairs in or around the home? Yes  If so, are there any without handrails? No  Home free of loose throw rugs in walkways, pet beds, electrical cords, etc? No  Adequate lighting in your home to reduce risk of falls? Yes   ASSISTIVE DEVICES UTILIZED TO PREVENT FALLS:  Life alert? No  Use of a cane, walker or w/c? Yes  Grab bars in the bathroom? Yes  Shower chair or bench in shower? Yes  Elevated toilet seat or a handicapped toilet? Yes   TIMED UP AND GO:  Was the test performed?  no televisit  .    Cognitive Function:        02/17/2023   11:10 AM  6CIT Screen  What Year? 0 points  What month? 0 points  What time? 0 points  Count back from 20 0 points  Months in reverse 0 points  Repeat phrase 0 points  Total Score 0 points    Immunizations Immunization History  Administered Date(s) Administered   Influenza Whole 06/14/2016   Influenza, High Dose Seasonal PF 07/19/2018, 06/15/2021   Influenza,inj,Quad PF,6+ Mos 06/15/2020   Influenza-Unspecified 06/12/2017, 06/14/2019   Janssen (J&J) SARS-COV-2 Vaccination 12/23/2019, 07/13/2020   Pneumococcal Conjugate-13 06/07/2014   Pneumococcal Polysaccharide-23 08/09/2018, 08/02/2019   Tdap 05/27/2010, 05/14/2020   Zoster Recombinat (Shingrix) 04/29/2018, 07/10/2018    TDAP status: Up to date  Flu Vaccine status: Up to date  Pneumococcal vaccine status: Up to date  Covid-19 vaccine status: Declined, Education has been provided regarding the importance of this vaccine but patient still declined. Advised may receive this vaccine at local pharmacy or Health Dept.or vaccine clinic. Aware to provide a copy of the vaccination record if obtained from local pharmacy or Health Dept. Verbalized acceptance and understanding.  Qualifies for Shingles Vaccine? Yes   Zostavax completed Yes   Shingrix Completed?: Yes  Screening Tests Health Maintenance  Topic Date Due   COVID-19 Vaccine (3 - 2023-24 season) 05/16/2022    FOOT EXAM  04/10/2023   INFLUENZA VACCINE  04/16/2023   HEMOGLOBIN A1C  05/08/2023   Colonoscopy  08/31/2023   Diabetic kidney evaluation - eGFR measurement  01/05/2024   Diabetic kidney evaluation - Urine ACR  01/05/2024   OPHTHALMOLOGY EXAM  02/03/2024   DTaP/Tdap/Td (3 - Td or Tdap) 05/14/2030   Pneumonia Vaccine 74+ Years old  Completed   Hepatitis C Screening  Completed   Zoster Vaccines- Shingrix  Completed   HPV VACCINES  Aged Out    Health Maintenance  Health Maintenance Due  Topic Date Due   COVID-19 Vaccine (3 - 2023-24 season) 05/16/2022    Colorectal cancer screening: Type of screening: Colonoscopy. Completed 08/30/18. Repeat every 5 years  Lung Cancer Screening: (Low Dose CT Chest recommended if Age 67-80 years, 30 pack-year currently smoking OR have quit w/in 15years.) does not qualify.   Lung Cancer Screening Referral: NO  Additional Screening:  Hepatitis C Screening: does not qualify; aged out   Vision Screening: Recommended annual ophthalmology exams for early detection of glaucoma and other disorders of  the eye. Is the patient up to date with their annual eye exam?  Yes  Who is the provider or what is the name of the office in which the patient attends annual eye exams? Dr.Bell If pt is not established with a provider, would they like to be referred to a provider to establish care? No .   Dental Screening: Recommended annual dental exams for proper oral hygiene  Community Resource Referral / Chronic Care Management: CRR required this visit?  No   CCM required this visit?  No      Plan:     I have personally reviewed and noted the following in the patient's chart:   Medical and social history Use of alcohol, tobacco or illicit drugs  Current medications and supplements including opioid prescriptions. Patient is currently taking opioid prescriptions. Information provided to patient regarding non-opioid alternatives. Patient advised to discuss  non-opioid treatment plan with their provider. Functional ability and status Nutritional status Physical activity Advanced directives List of other physicians Hospitalizations, surgeries, and ER visits in previous 12 months Vitals Screenings to include cognitive, depression, and falls Referrals and appointments  In addition, I have reviewed and discussed with patient certain preventive protocols, quality metrics, and best practice recommendations. A written personalized care plan for preventive services as well as general preventive health recommendations were provided to patient.    Annabell Sabal, CMA 02/17/2023   Nurse Notes: none

## 2023-03-11 DIAGNOSIS — M25561 Pain in right knee: Secondary | ICD-10-CM | POA: Diagnosis not present

## 2023-03-11 DIAGNOSIS — M25661 Stiffness of right knee, not elsewhere classified: Secondary | ICD-10-CM | POA: Diagnosis not present

## 2023-03-13 DIAGNOSIS — M25661 Stiffness of right knee, not elsewhere classified: Secondary | ICD-10-CM | POA: Diagnosis not present

## 2023-03-13 DIAGNOSIS — M25561 Pain in right knee: Secondary | ICD-10-CM | POA: Diagnosis not present

## 2023-03-24 ENCOUNTER — Other Ambulatory Visit: Payer: Self-pay | Admitting: Family Medicine

## 2023-03-24 DIAGNOSIS — G4726 Circadian rhythm sleep disorder, shift work type: Secondary | ICD-10-CM

## 2023-03-25 NOTE — Telephone Encounter (Signed)
ERx 

## 2023-03-25 NOTE — Telephone Encounter (Signed)
Name of Medication: Ambien Name of Pharmacy: [facility] Last Fill or Written Date and Quantity: 02/24/23, #20 Last Office Visit and Type: 01/12/23, CPE Next Office Visit and Type:  04/14/23, 3 mo f/u Last Controlled Substance Agreement Date: none Last UDS: none

## 2023-04-14 ENCOUNTER — Encounter: Payer: Self-pay | Admitting: Family Medicine

## 2023-04-14 ENCOUNTER — Ambulatory Visit (INDEPENDENT_AMBULATORY_CARE_PROVIDER_SITE_OTHER): Payer: Medicare PPO | Admitting: Family Medicine

## 2023-04-14 ENCOUNTER — Encounter: Payer: Self-pay | Admitting: *Deleted

## 2023-04-14 VITALS — BP 130/74 | HR 72 | Temp 97.3°F | Ht 63.5 in | Wt 156.1 lb

## 2023-04-14 DIAGNOSIS — E785 Hyperlipidemia, unspecified: Secondary | ICD-10-CM

## 2023-04-14 DIAGNOSIS — Z8249 Family history of ischemic heart disease and other diseases of the circulatory system: Secondary | ICD-10-CM | POA: Insufficient documentation

## 2023-04-14 DIAGNOSIS — Z7984 Long term (current) use of oral hypoglycemic drugs: Secondary | ICD-10-CM | POA: Diagnosis not present

## 2023-04-14 DIAGNOSIS — Z7985 Long-term (current) use of injectable non-insulin antidiabetic drugs: Secondary | ICD-10-CM

## 2023-04-14 DIAGNOSIS — E1169 Type 2 diabetes mellitus with other specified complication: Secondary | ICD-10-CM

## 2023-04-14 LAB — POCT GLYCOSYLATED HEMOGLOBIN (HGB A1C): Hemoglobin A1C: 7.6 % — AB (ref 4.0–5.6)

## 2023-04-14 NOTE — Progress Notes (Signed)
Ph: [phone] Fax: [phone]   Patient ID: Kevin Asturias., male    DOB: Jul 14, 1951, 72 y.o.   MRN: [id number]  This visit was conducted in person.  BP 130/74   Pulse 72   Temp (!) 97.3 F (36.3 C) (Temporal)   Ht 5' 3.5" (1.613 m)   Wt 156 lb 2 oz (70.8 kg)   SpO2 98%   BMI 27.22 kg/m    CC: DM f/u visit  Subjective:   HPI: Kevin Sheppard. is a 72 y.o. male presenting on 04/14/2023 for Medical Management of Chronic Issues (Here for 3 mo DM f/u.)   Recent R knee replacement 11/2022. Back to swimming. May need L knee evaluated.   Fmhx premature CAD - 2 brothers have had CAD - one had bypass. Father with MI age 37yo.   Hypothyroidism - last visit we dropped levothyroxine dose to daily. Rpt levels normal.  Lab Results  Component Value Date   TSH 1.42 02/23/2023   DM - does regularly check sugars 140-150. Compliant with antihyperglycemic regimen which includes: glipizide XL 5mg  daily, metformin 1000mg  bid, ozempic 1mg  weekly. No constipation or nausea. Few low sugars that are now resolved. Denies paresthesias, blurry vision. Last diabetic eye exam 01/2023. Glucometer brand: freestyle lite. Last foot exam: DUE. DSME: completed remotely. Lab Results  Component Value Date   HGBA1C 7.6 (A) 04/14/2023   Diabetic Foot Exam - Simple   Simple Foot Form Visual Inspection No deformities, no ulcerations, no other skin breakdown bilaterally: Yes Sensation Testing Intact to touch and monofilament testing bilaterally: Yes Pulse Check Posterior Tibialis and Dorsalis pulse intact bilaterally: Yes Comments No claudication    Lab Results  Component Value Date   MICROALBUR 2.3 (H) 01/05/2023         Relevant past medical, surgical, family and social history reviewed and updated as indicated. Interim medical history since our last visit reviewed. Allergies and medications reviewed and updated. Outpatient Medications Prior to Visit  Medication Sig Dispense Refill    atorvastatin (LIPITOR) 20 MG tablet Take 1 tablet (20 mg total) by mouth daily. 90 tablet 4   bisacodyl (DULCOLAX) 5 MG EC tablet Take 2 tablets (10 mg total) by mouth daily as needed for moderate constipation. 30 tablet 0   Blood Glucose Monitoring Suppl (FREESTYLE LITE) w/Device KIT Use to check sugars daily and as needed E11.69 1 kit 0   docusate sodium (COLACE) 100 MG capsule Take 1 capsule (100 mg total) by mouth 2 (two) times daily. 10 capsule 0   glipiZIDE (GLUCOTROL XL) 5 MG 24 hr tablet Take 1 tablet (5 mg total) by mouth daily with breakfast. 90 tablet 4   glucose blood (FREESTYLE LITE) test strip Use as instructed to check blood sugar 3 times a day 300 each 3   levothyroxine (SYNTHROID) 88 MCG tablet Take 1 tablet (88 mcg total) by mouth daily with breakfast. 90 tablet 4   metFORMIN (GLUCOPHAGE) 1000 MG tablet Take 1 tablet (1,000 mg total) by mouth 2 (two) times daily with a meal. 180 tablet 4   Multiple Vitamins-Minerals (MULTIVITAMIN ADULTS 50+ PO) Take 1 tablet by mouth daily.     omeprazole (PRILOSEC) 40 MG capsule Take 1 capsule (40 mg total) by mouth daily as needed. 90 capsule 2   Semaglutide, 1 MG/DOSE, 4 MG/3ML SOPN Inject 1 mg as directed once a week. (Patient taking differently: Inject 1 mg as directed once a week. Tuesday) 9 mL 3   tadalafil (CIALIS)  10 MG tablet TAKE ONE TABLET BY MOUTH EVERY OTHER DAY AS NEEDED FOR ERECTILE DYSFUNCTION 10 tablet 6   zolpidem (AMBIEN) 10 MG tablet TAKE 1 TABLET(10 MG) BY MOUTH AT BEDTIME AS NEEDED FOR SLEEP 20 tablet 0   methocarbamol (ROBAXIN) 500 MG tablet      oxyCODONE (OXY IR/ROXICODONE) 5 MG immediate release tablet Take 1 tablet (5 mg total) by mouth every 4 (four) hours as needed for moderate pain (pain score 4-6). 40 tablet 0   No facility-administered medications prior to visit.     Per HPI unless specifically indicated in ROS section below Review of Systems  Objective:  BP 130/74   Pulse 72   Temp (!) 97.3 F (36.3 C)  (Temporal)   Ht 5' 3.5" (1.613 m)   Wt 156 lb 2 oz (70.8 kg)   SpO2 98%   BMI 27.22 kg/m   Wt Readings from Last 3 Encounters:  04/14/23 156 lb 2 oz (70.8 kg)  02/17/23 154 lb (69.9 kg)  01/12/23 154 lb 2 oz (69.9 kg)      Physical Exam Vitals and nursing note reviewed.  Constitutional:      Appearance: Normal appearance. He is not ill-appearing.  Eyes:     Extraocular Movements: Extraocular movements intact.     Conjunctiva/sclera: Conjunctivae normal.     Pupils: Pupils are equal, round, and reactive to light.  Cardiovascular:     Rate and Rhythm: Normal rate and regular rhythm.     Pulses: Normal pulses.     Heart sounds: Normal heart sounds. No murmur heard. Pulmonary:     Effort: Pulmonary effort is normal. No respiratory distress.     Breath sounds: Normal breath sounds. No wheezing, rhonchi or rales.  Musculoskeletal:     Right lower leg: No edema.     Left lower leg: No edema.     Comments: See HPI for foot exam if done  Skin:    General: Skin is warm and dry.     Findings: No rash.  Neurological:     Mental Status: He is alert.  Psychiatric:        Mood and Affect: Mood normal.        Behavior: Behavior normal.       Results for orders placed or performed in visit on 04/14/23  POCT glycosylated hemoglobin (Hb A1C)  Result Value Ref Range   Hemoglobin A1C 7.6 (A) 4.0 - 5.6 %   HbA1c POC (<> result, manual entry)     HbA1c, POC (prediabetic range)     HbA1c, POC (controlled diabetic range)      Assessment & Plan:   Problem List Items Addressed This Visit     Type 2 diabetes mellitus with other specified complication (HCC) - Primary    Chronic, improving but still above goal.  He will continue regular exercise as well as continue efforts towards diabetic diet.  Continue current regimen.       Relevant Orders   POCT glycosylated hemoglobin (Hb A1C) (Completed)   CT CARDIAC SCORING (SELF PAY ONLY)   Hyperlipidemia associated with type 2 diabetes  mellitus (HCC)    Chronic, stable on atorvastatin.  Given fmhx premature CAD, will check coronary CT with calcium score.  The 10-year ASCVD risk score (Arnett DK, et al., 2019) is: 27.9%   Values used to calculate the score:     Age: 37 years     Sex: Male     Is Non-Hispanic African American:  No     Diabetic: Yes     Tobacco smoker: No     Systolic Blood Pressure: 130 mmHg     Is BP treated: No     HDL Cholesterol: 56.2 mg/dL     Total Cholesterol: 155 mg/dL       Relevant Orders   CT CARDIAC SCORING (SELF PAY ONLY)   Family history of premature CAD    2 brothers have had CAD - one had bypass. Father with MI age 27yo.  See above - check coronary calcium score  Check Lp(a) next labwork.       Relevant Orders   CT CARDIAC SCORING (SELF PAY ONLY)     No orders of the defined types were placed in this encounter.   Orders Placed This Encounter  Procedures   CT CARDIAC SCORING (SELF PAY ONLY)    Standing Status:   Future    Standing Expiration Date:   04/13/2024    Order Specific Question:   Preferred imaging location?    Answer:   [facility]   POCT glycosylated hemoglobin (Hb A1C)    Patient Instructions  Call [facility] at [phone] to ask about scheduling colonoscopy.  Check with insurance if they will cover coronary CT calcium score - I ordered this to [facility].  Continue current medicines.  Continue low sugar low carb diabetic diet.  Return in 4 months for diabetes follow up visit   Follow up plan: Return in about 4 months (around 08/15/2023) for follow up visit.  Eustaquio Boyden, MD

## 2023-04-14 NOTE — Patient Instructions (Addendum)
Call [facility] at [phone] to ask about scheduling colonoscopy.  Check with insurance if they will cover coronary CT calcium score - I ordered this to [facility].  Continue current medicines.  Continue low sugar low carb diabetic diet.  Return in 4 months for diabetes follow up visit

## 2023-04-14 NOTE — Assessment & Plan Note (Addendum)
2 brothers have had CAD - one had bypass. Father with MI age 70yo.  See above - check coronary calcium score  Check Lp(a) next labwork.

## 2023-04-14 NOTE — Assessment & Plan Note (Signed)
Chronic, stable on atorvastatin.  Given fmhx premature CAD, will check coronary CT with calcium score.  The 10-year ASCVD risk score (Arnett DK, et al., 2019) is: 27.9%   Values used to calculate the score:     Age: 72 years     Sex: Male     Is Non-Hispanic African American: No     Diabetic: Yes     Tobacco smoker: No     Systolic Blood Pressure: 130 mmHg     Is BP treated: No     HDL Cholesterol: 56.2 mg/dL     Total Cholesterol: 155 mg/dL

## 2023-04-14 NOTE — Assessment & Plan Note (Signed)
Chronic, improving but still above goal.  He will continue regular exercise as well as continue efforts towards diabetic diet.  Continue current regimen.

## 2023-04-15 DIAGNOSIS — M25661 Stiffness of right knee, not elsewhere classified: Secondary | ICD-10-CM | POA: Diagnosis not present

## 2023-04-22 ENCOUNTER — Ambulatory Visit
Admission: RE | Admit: 2023-04-22 | Discharge: 2023-04-22 | Disposition: A | Discharge status: Home / Self Care | Payer: Self-pay | Source: Ambulatory Visit | Attending: Family Medicine | Admitting: Family Medicine

## 2023-04-22 DIAGNOSIS — Z8249 Family history of ischemic heart disease and other diseases of the circulatory system: Secondary | ICD-10-CM | POA: Insufficient documentation

## 2023-04-22 DIAGNOSIS — E1169 Type 2 diabetes mellitus with other specified complication: Secondary | ICD-10-CM | POA: Insufficient documentation

## 2023-04-22 DIAGNOSIS — E785 Hyperlipidemia, unspecified: Secondary | ICD-10-CM | POA: Insufficient documentation

## 2023-04-23 ENCOUNTER — Encounter: Payer: Self-pay | Admitting: Family Medicine

## 2023-04-24 ENCOUNTER — Other Ambulatory Visit: Payer: Self-pay | Admitting: Family Medicine

## 2023-04-24 DIAGNOSIS — E1169 Type 2 diabetes mellitus with other specified complication: Secondary | ICD-10-CM

## 2023-04-24 DIAGNOSIS — Z8249 Family history of ischemic heart disease and other diseases of the circulatory system: Secondary | ICD-10-CM

## 2023-04-24 DIAGNOSIS — R931 Abnormal findings on diagnostic imaging of heart and coronary circulation: Secondary | ICD-10-CM | POA: Insufficient documentation

## 2023-04-24 MED ORDER — ASPIRIN 81 MG PO TBEC: 81.0000 mg | DELAYED_RELEASE_TABLET | Freq: Every day | ORAL | Status: AC

## 2023-04-24 MED ORDER — ATORVASTATIN CALCIUM 40 MG PO TABS: 40.0000 mg | ORAL_TABLET | Freq: Every day | ORAL | 3 refills | Status: DC

## 2023-04-24 NOTE — Telephone Encounter (Signed)
Replied via result note.

## 2023-04-25 ENCOUNTER — Encounter: Payer: Self-pay | Admitting: Family Medicine

## 2023-04-28 MED ORDER — ATORVASTATIN CALCIUM 20 MG PO TABS: 20.0000 mg | ORAL_TABLET | Freq: Every day | ORAL | 2 refills | Status: DC

## 2023-04-30 ENCOUNTER — Encounter: Payer: Self-pay | Admitting: Family Medicine

## 2023-04-30 DIAGNOSIS — I700 Atherosclerosis of aorta: Secondary | ICD-10-CM | POA: Insufficient documentation

## 2023-05-26 DIAGNOSIS — M1712 Unilateral primary osteoarthritis, left knee: Secondary | ICD-10-CM | POA: Diagnosis not present

## 2023-07-01 ENCOUNTER — Encounter: Payer: Self-pay | Admitting: Cardiology

## 2023-07-01 ENCOUNTER — Ambulatory Visit: Payer: TRICARE For Life (TFL) | Attending: Cardiology | Admitting: Cardiology

## 2023-07-01 VITALS — BP 110/76 | HR 64 | Ht 64.0 in | Wt 166.4 lb

## 2023-07-01 DIAGNOSIS — I2510 Atherosclerotic heart disease of native coronary artery without angina pectoris: Secondary | ICD-10-CM

## 2023-07-01 DIAGNOSIS — E785 Hyperlipidemia, unspecified: Secondary | ICD-10-CM | POA: Diagnosis not present

## 2023-07-01 MED ORDER — ATORVASTATIN CALCIUM 40 MG PO TABS: 40.0000 mg | ORAL_TABLET | Freq: Every day | ORAL | 0 refills | Status: DC

## 2023-07-01 NOTE — Progress Notes (Signed)
Cardiology Office Note:    Date:  07/01/2023   ID:  Kevin Sheppard., DOB [birth year], MRN [id number]  PCP:  Kevin Boyden, MD   [facility] Providers Cardiologist:  Kevin Odea, MD     Referring MD: Kevin Boyden, MD   Chief Complaint  Patient presents with   New Patient (Initial Visit)    Referred for cardiac evaluation of Agatston coronary artery calcium score greater than 400.  Previous cardiac workup many years ago at unknown office.      History of Present Illness:    Kevin Sheppard. is a 72 y.o. male with a hx of hyperlipidemia, diabetes presenting due to elevated calcium score and family history of CAD.  Father had an MI in his late 28s, 2 brothers also have coronary artery disease with 1 having bypass.  He underwent a coronary calcium score on 04/22/2023 for restratification.  Calcium score was 1061, 86 th percentile.  He states being very active with swimming, walking exercises without any chest pain or shortness of breath.  Denies ever smoking.  Has been on Lipitor 20 mg for several years now.  Started on aspirin 81 mg after coronary calcium scan was done.  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   Undescended left testicle 1961   after trauma with dodgeball    Past Surgical History:  Procedure Laterality Date   COLONOSCOPY  2004;2009   first with b9 polyps, then diverticulosis no polyps, rpt 84yrs (Kevin Sheppard)   COLONOSCOPY WITH PROPOFOL N/A 08/30/2018   2 TA, 1 SSP, diverticulosis, rpt 5 yrs Kevin Sheppard, Darren, MD)   KNEE CLOSED REDUCTION Right 01/01/2023    CLOSED MANIPULATION KNEE Kevin Fairly, MD)   MENISCUS REPAIR Left 2008   MENISCUS REPAIR Right 2002   POLYPECTOMY N/A 08/30/2018   Procedure: POLYPECTOMY;  Surgeon: Kevin Minium, MD;  Location: [facility];  Service: Endoscopy;  Laterality: N/A;   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 Swallow whole.   Blood Glucose Monitoring Suppl (FREESTYLE LITE) w/Device KIT Use to check sugars daily and as needed E11.69   glipiZIDE (GLUCOTROL XL) 5 MG 24 hr tablet Take 1 tablet (5 mg total) by mouth daily with breakfast.   glucose blood (FREESTYLE LITE) test strip Use as instructed to check blood sugar 3 times a day   levothyroxine (SYNTHROID) 88 MCG tablet Take 1 tablet (88 mcg total) by mouth daily with breakfast.   metFORMIN (GLUCOPHAGE) 1000 MG tablet Take 1 tablet (1,000 mg total) by mouth 2 (two) times daily with a meal.   Multiple Vitamins-Minerals (MULTIVITAMIN ADULTS 50+ PO) Take 1 tablet by mouth daily.   Omega-3 Fatty Acids (FISH OIL PO) Take 1 tablet by mouth daily.   omeprazole (PRILOSEC) 40 MG capsule  Take 1 capsule (40 mg total) by mouth daily as needed.   Semaglutide, 1 MG/DOSE, 4 MG/3ML SOPN Inject 1 mg as directed once a week. (Patient taking differently: Inject 1 mg as directed once a week. Tuesday)   tadalafil (CIALIS) 10 MG tablet TAKE ONE TABLET BY MOUTH EVERY OTHER DAY AS NEEDED FOR ERECTILE DYSFUNCTION   zolpidem (AMBIEN) 10 MG tablet TAKE 1 TABLET(10 MG) BY MOUTH AT BEDTIME AS NEEDED FOR SLEEP   [DISCONTINUED] atorvastatin (LIPITOR) 20 MG tablet Take 1 tablet (20 mg total) by mouth daily.     Allergies:   Lyrica [pregabalin]   Social History   Socioeconomic History   Marital status: Married    Spouse  name: Kevin Sheppard   Number of children: 4   Years of education: Not on file   Highest education level: Not on file  Occupational History   Occupation: Advertising copywriter  Tobacco Use   Smoking status: Never   Smokeless tobacco: Never  Vaping Use   Vaping status: Never Used  Substance and Sexual Activity   Alcohol use: Not Currently    Comment: occassional   Drug use: No   Sexual activity: Yes  Other Topics Concern   Not on file  Social History Narrative   He lives with wife.  2 dogs. They have grown 4 children and 3 grandchildren.    Retired Dentist)   He works on [employer] , 3rd shift   Activity: goes to gym regularly   Diet: good water, fruits/vegetables daily   Social Determinants of Corporate investment banker Strain: Low Risk  (02/17/2023)   Overall Financial Resource Strain (CARDIA)    Difficulty of Paying Living Expenses: Not hard at all  Food Insecurity: No Food Insecurity (02/17/2023)   Hunger Vital Sign    Worried About Running Out of Food in the Last Year: Never true    Ran Out of Food in the Last Year: Never true  Transportation Needs: No Transportation Needs (02/17/2023)   PRAPARE - Administrator, Civil Service (Medical): No    Lack of Transportation (Non-Medical): No  Physical Activity: Sufficiently Active (02/17/2023)   Exercise Vital Sign    Days of Exercise per Week: 7 days    Minutes of Exercise per Session: 30 min  Stress: No Stress Concern Present (02/17/2023)   [employer] of Occupational Health - Occupational Stress Questionnaire    Feeling of Stress : Not at all  Social Connections: Socially Integrated (02/17/2023)   Social Connection and Isolation Panel [NHANES]    Frequency of Communication with Friends and Family: More than three times a week    Frequency of Social Gatherings with Friends and Family: More than three times a week    Attends Religious Services: More than 4 times per year    Active Member of [employer] or  Organizations: Yes    Attends Engineer, structural: More than 4 times per year    Marital Status: Married     Family History: The patient's family history includes Diabetes in his father; Diabetes type II in his brother and maternal aunt; Heart disease in his brother, brother, and father; Hypertension in his sister; Other in his mother; Stroke in his father; Stroke (age of onset: 14) in his sister; Thyroid disease in his sister. There is no history of Cancer or CAD.  ROS:   Please see the history of present illness.     All other systems reviewed and are negative.  EKGs/Labs/Other Studies Reviewed:    The following studies were reviewed today:  EKG Interpretation Date/Time:  Wednesday July 01 2023 08:58:09 EDT Ventricular Rate:  64 PR Interval:  146 QRS Duration:  106 QT Interval:  400 QTC Calculation: 412 R Axis:   -66  Text Interpretation: Normal sinus rhythm Pulmonary disease pattern Left anterior fascicular block Confirmed by Kevin Sheppard ([id number]) on 07/01/2023 9:04:02 AM    Recent Labs: 01/01/2023: Hemoglobin 12.4; Platelets 247 01/05/2023: ALT 20; BUN 24; Creatinine, Ser 0.78; Potassium 4.5; Sodium 137 02/23/2023: TSH 1.42  Recent Lipid Panel    Component Value Date/Time   CHOL 155 01/05/2023 1424   CHOL 133 10/21/2018 0915   CHOL 240 06/06/2014 0000   TRIG 106.0 01/05/2023 1424   TRIG 108 06/06/2014 0000   HDL 56.20 01/05/2023 1424   HDL 46 10/21/2018 0915   CHOLHDL 3 01/05/2023 1424   VLDL 21.2 01/05/2023 1424   LDLCALC 77 01/05/2023 1424   LDLCALC 68 10/21/2018 0915   LDLCALC 166 06/06/2014 0000     Risk Assessment/Calculations:             Physical Exam:    VS:  BP 110/76 (BP Location: Right Arm, Patient Position: Sitting, Cuff Size: Normal)   Pulse 64   Ht 5\' 4"  (1.626 m)   Wt 166 lb 6.4 oz (75.5 kg)   SpO2 97%   BMI 28.56 kg/m     Wt Readings from Last 3 Encounters:  07/01/23 166 lb 6.4 oz (75.5 kg)  04/14/23 156 lb 2 oz  (70.8 kg)  02/17/23 154 lb (69.9 kg)     GEN:  Well nourished, well developed in no acute distress HEENT: Normal NECK: No JVD; No carotid bruits CARDIAC: RRR, no murmurs, rubs, gallops RESPIRATORY:  Clear to auscultation without rales, wheezing or rhonchi  ABDOMEN: Soft, non-tender, non-distended MUSCULOSKELETAL:  No edema; No deformity  SKIN: Warm and dry NEUROLOGIC:  Alert and oriented x 3 PSYCHIATRIC:  Normal affect   ASSESSMENT:    1. Coronary artery disease involving native coronary artery of native heart, unspecified whether angina present   2. Hyperlipidemia LDL goal <70    PLAN:    In order of problems listed above:  CAD, three-vessel coronary calcification, calcium score 1061, 86 percentile.  Continue aspirin, increase Lipitor to 40 mg daily.  Obtain echocardiogram.  Patient is otherwise asymptomatic.  Consider ischemic workup if patient becomes symptomatic or reduced EF. LDL goal less than 70.  Agree with statin.  Obtain lipoprotein a due to strong family history.  Increase Lipitor to 40 mg daily.  Follow-up after cardiac testing.     Medication Adjustments/Labs and Tests Ordered: Current medicines are reviewed at length with the patient today.  Concerns regarding medicines are outlined above.  Orders Placed This Encounter  Procedures   Lipoprotein A (LPA)   EKG 12-Lead   ECHOCARDIOGRAM COMPLETE   Meds ordered this encounter  Medications   atorvastatin (LIPITOR) 40 MG tablet    Sig: Take 1 tablet (40 mg total) by mouth daily.    Dispense:  90 tablet    Refill:  0    Patient Instructions  Medication Instructions:   INCREASE Atorvastatin - Take one tablet ( 40mg ) by mouth daily.   *If you need a refill on your cardiac medications before your next appointment, please call your pharmacy*   Lab Work:  Your physician recommends you have labs - Lipoprotein A  If you have labs (blood work) drawn today and  your tests are completely normal, you will receive  your results only by: MyChart Message (if you have MyChart) OR A paper copy in the mail If you have any lab test that is abnormal or we need to change your treatment, we will call you to review the results.   Testing/Procedures:  Your physician has requested that you have an echocardiogram. Echocardiography is a painless test that uses sound waves to create images of your heart. It provides your doctor with information about the size and shape of your heart and how well your heart's chambers and valves are working. This procedure takes approximately one hour. There are no restrictions for this procedure. Please do NOT wear cologne, perfume, aftershave, or lotions (deodorant is allowed). Please arrive 15 minutes prior to your appointment time.    Follow-Up: At [facility], you and your health needs are our priority.  As part of our continuing mission to provide you with exceptional heart care, we have created designated Provider Care Teams.  These Care Teams include your primary Cardiologist (physician) and Advanced Practice Providers (APPs -  Physician Assistants and Nurse Practitioners) who all work together to provide you with the care you need, when you need it.  We recommend signing up for the patient portal called "MyChart".  Sign up information is provided on this After Visit Summary.  MyChart is used to connect with patients for Virtual Visits (Telemedicine).  Patients are able to view lab/test results, encounter notes, upcoming appointments, etc.  Non-urgent messages can be sent to your provider as well.   To learn more about what you can do with MyChart, go to [email or web address].    Your next appointment:    2 months   Provider:   You may see Kevin Odea, MD or one of the following Advanced Practice Providers on your designated Care Team:   Nicolasa Ducking, NP Eula Listen, PA-C Cadence Fransico Michael, PA-C Charlsie Quest, NP   Signed, Kevin Odea, MD   07/01/2023 9:55 AM    [facility]

## 2023-07-01 NOTE — Patient Instructions (Signed)
Medication Instructions:   INCREASE Atorvastatin - Take one tablet ( 40mg ) by mouth daily.   *If you need a refill on your cardiac medications before your next appointment, please call your pharmacy*   Lab Work:  Your physician recommends you have labs - Lipoprotein A  If you have labs (blood work) drawn today and your tests are completely normal, you will receive your results only by: MyChart Message (if you have MyChart) OR A paper copy in the mail If you have any lab test that is abnormal or we need to change your treatment, we will call you to review the results.   Testing/Procedures:  Your physician has requested that you have an echocardiogram. Echocardiography is a painless test that uses sound waves to create images of your heart. It provides your doctor with information about the size and shape of your heart and how well your heart's chambers and valves are working. This procedure takes approximately one hour. There are no restrictions for this procedure. Please do NOT wear cologne, perfume, aftershave, or lotions (deodorant is allowed). Please arrive 15 minutes prior to your appointment time.    Follow-Up: At [facility], you and your health needs are our priority.  As part of our continuing mission to provide you with exceptional heart care, we have created designated Provider Care Teams.  These Care Teams include your primary Cardiologist (physician) and Advanced Practice Providers (APPs -  Physician Assistants and Nurse Practitioners) who all work together to provide you with the care you need, when you need it.  We recommend signing up for the patient portal called "MyChart".  Sign up information is provided on this After Visit Summary.  MyChart is used to connect with patients for Virtual Visits (Telemedicine).  Patients are able to view lab/test results, encounter notes, upcoming appointments, etc.  Non-urgent messages can be sent to your provider as well.   To  learn more about what you can do with MyChart, go to [email or web address].    Your next appointment:    2 months   Provider:   You may see Debbe Odea, MD or one of the following Advanced Practice Providers on your designated Care Team:   Nicolasa Ducking, NP Eula Listen, PA-C Cadence Fransico Michael, PA-C Charlsie Quest, NP

## 2023-07-03 ENCOUNTER — Encounter: Payer: Self-pay | Admitting: Family Medicine

## 2023-07-03 LAB — LIPOPROTEIN A (LPA): Lipoprotein (a): 18.5 nmol/L (ref <75.0)

## 2023-07-04 ENCOUNTER — Other Ambulatory Visit: Payer: Self-pay | Admitting: Family Medicine

## 2023-07-20 ENCOUNTER — Ambulatory Visit: Payer: Medicare PPO | Attending: Cardiology

## 2023-07-20 DIAGNOSIS — I2510 Atherosclerotic heart disease of native coronary artery without angina pectoris: Secondary | ICD-10-CM | POA: Diagnosis not present

## 2023-07-21 LAB — ECHOCARDIOGRAM COMPLETE
Area-P 1/2: 2.95 cm2
S' Lateral: 3.3 cm

## 2023-07-22 DIAGNOSIS — M1712 Unilateral primary osteoarthritis, left knee: Secondary | ICD-10-CM | POA: Diagnosis not present

## 2023-08-18 ENCOUNTER — Ambulatory Visit: Payer: TRICARE For Life (TFL) | Admitting: Family Medicine

## 2023-08-30 NOTE — Progress Notes (Unsigned)
Cardiology Clinic Note   Date: 08/31/2023 ID: Kevin Sheppard., DOB 18-Dec-1950, MRN [id number]  Primary Cardiologist:  Debbe Odea, MD  Patient Profile    Kevin Sheppard. is a 72 y.o. male who presents to the clinic today for follow up after echo.     Past medical history significant for: Elevated coronary calcium score.. CT cardiac scoring 04/22/2023: Coronary calcium score 1061 (86th percentile).  CAC > 300 in LAD, LCx, RCA. Echo 07/20/2023: EF 50 to 55%.  No RWMA.  Normal diastolic parameters.  Normal RV size/function.  Mild MR. LAFB. Hyperlipidemia. Lipid panel 01/05/2023: LDL 77, HDL 56, TG 106, total 155. LPa 07/01/2023: 18.5. Hypothyroidism. GERD. T2DM.  In summary, patient was first evaluated by Dr. Azucena Cecil on 07/01/2023 for elevated cardiac calcium score at the request of Dr. Sharen Hones.  Patient reported family history of premature CAD with father having an MI in his late 30s, and 2 brothers with coronary artery disease 1 having a bypass.  He denied any chest pain or shortness of breath and reported an active lifestyle of swimming and walking for exercise.  He underwent echo as above.     History of Present Illness    Kevin Sheppard. is followed by Dr. Azucena Cecil for the above outlined history.   Today, patient is doing well. He denies shortness of breath, dyspnea on exertion, lower extremity edema, orthopnea or PND. No chest pain, pressure, or tightness. No palpitations. He is very active swimming, using the elliptical, and walking 5 days a week. Echo results were discussed. All questions answered.      ROS: All other systems reviewed and are otherwise negative except as noted in History of Present Illness.  Studies Reviewed    EKG Interpretation Date/Time:  Monday August 31 2023 15:34:59 EST Ventricular Rate:  68 PR Interval:  148 QRS Duration:  98 QT Interval:  394 QTC Calculation: 418 R Axis:   -75  Text Interpretation: Normal sinus rhythm Left  anterior fascicular block When compared with ECG of 01-Jul-2023 08:58, No significant change was found Confirmed by Carlos Levering [id number]) on 08/31/2023 3:48:20 PM    Physical Exam    VS:  BP 112/80 (BP Location: Left Arm, Patient Position: Sitting, Cuff Size: Normal)   Pulse 68   Ht 5\' 4"  (1.626 m)   Wt 174 lb (78.9 kg)   SpO2 98%   BMI 29.87 kg/m  , BMI Body mass index is 29.87 kg/m.  GEN: Well nourished, well developed, in no acute distress. Neck: No JVD or carotid bruits. Cardiac:  RRR. No murmurs. No rubs or gallops.   Respiratory:  Respirations regular and unlabored. Clear to auscultation without rales, wheezing or rhonchi. GI: Soft, nontender, nondistended. Extremities: Radials/DP/PT 2+ and equal bilaterally. No clubbing or cyanosis. No edema.  Skin: Warm and dry, no rash. Neuro: Strength intact.  Assessment & Plan   Elevated coronary calcium score CT cardiac scoring August 2024 showed calcium score of 1061.  Echo with normal LV/RV function, mild MR.  Patient denies chest pain, pressure or tightness. He is very active swimming, walking and using the elliptical five days a week.  -Continue physical activity.  -Continue aspirin, atorvastatin.  LAFB Patient denies palpitations, shortness of breath, activity intolerance, presyncope or syncope.   Hyperlipidemia LDL April 2024 77, not at goal.  Lipitor increased in April 2024. -Continue atorvastatin. -Patient has an upcoming visit with PCP on 12/27 and will have labs drawn at that time.   Disposition: Return in  1 year or sooner as needed.          Signed, Etta Grandchild. Chesney Suares, DNP, NP-C

## 2023-08-31 ENCOUNTER — Encounter: Payer: Self-pay | Admitting: Student

## 2023-08-31 ENCOUNTER — Ambulatory Visit: Payer: Medicare PPO | Attending: Student | Admitting: Student

## 2023-08-31 VITALS — BP 112/80 | HR 68 | Ht 64.0 in | Wt 174.0 lb

## 2023-08-31 DIAGNOSIS — I444 Left anterior fascicular block: Secondary | ICD-10-CM | POA: Diagnosis not present

## 2023-08-31 DIAGNOSIS — I700 Atherosclerosis of aorta: Secondary | ICD-10-CM

## 2023-08-31 DIAGNOSIS — E785 Hyperlipidemia, unspecified: Secondary | ICD-10-CM

## 2023-08-31 DIAGNOSIS — E1169 Type 2 diabetes mellitus with other specified complication: Secondary | ICD-10-CM | POA: Diagnosis not present

## 2023-08-31 DIAGNOSIS — R931 Abnormal findings on diagnostic imaging of heart and coronary circulation: Secondary | ICD-10-CM

## 2023-08-31 NOTE — Patient Instructions (Signed)
Medication Instructions:  None  *If you need a refill on your cardiac medications before your next appointment, please call your pharmacy*   Lab Work: None  If you have labs (blood work) drawn today and your tests are completely normal, you will receive your results only by: MyChart Message (if you have MyChart) OR A paper copy in the mail If you have any lab test that is abnormal or we need to change your treatment, we will call you to review the results.   Testing/Procedures: None   Follow-Up: At [facility], you and your health needs are our priority.  As part of our continuing mission to provide you with exceptional heart care, we have created designated Provider Care Teams.  These Care Teams include your primary Cardiologist (physician) and Advanced Practice Providers (APPs -  Physician Assistants and Nurse Practitioners) who all work together to provide you with the care you need, when you need it.    Your next appointment:   1 year(s)  Provider:   Debbe Odea, MD or Carlos Levering, NP

## 2023-09-06 ENCOUNTER — Ambulatory Visit
Admission: EM | Admit: 2023-09-06 | Discharge: 2023-09-06 | Disposition: A | Discharge status: Home / Self Care | Payer: Medicare PPO | Attending: Emergency Medicine | Admitting: Emergency Medicine

## 2023-09-06 DIAGNOSIS — K122 Cellulitis and abscess of mouth: Secondary | ICD-10-CM

## 2023-09-06 MED ORDER — LIDOCAINE VISCOUS HCL 2 % MT SOLN: 15.0000 mL | OROMUCOSAL | 0 refills | Status: DC | PRN

## 2023-09-06 MED ORDER — AMOXICILLIN-POT CLAVULANATE 875-125 MG PO TABS: 1.0000 | ORAL_TABLET | Freq: Two times a day (BID) | ORAL | 0 refills | Status: DC

## 2023-09-06 NOTE — Discharge Instructions (Signed)
Today on evaluation you have a abscess which is a pocket infection into the cheek most likely related to your teeth  Begin Augmentin every morning and every evening for 7 days to clear bacteria contributing  May gargle and spit lidocaine solution every 4 hours as needed for temporary relief  May attempt use of salt water gargles, Listerine gargles, throat lozenges warm liquids and soft foods  May continue to take ibuprofen and/or Tylenol as needed for pain  Please follow-up with your dentist for reevaluation

## 2023-09-06 NOTE — ED Triage Notes (Signed)
Pt presents with pain and swelling to right lower gum/jaw x 2 days. Taking ibuprofen.

## 2023-09-06 NOTE — ED Provider Notes (Signed)
Kevin Sheppard    CSN: [id number] Arrival date & time: 09/06/23  0801      History   Chief Complaint Chief Complaint  Patient presents with   Oral Swelling    HPI Kevin Sheppard. is a 72 y.o. male.   Patient presents for evaluation of right lower gum and cheek swelling beginning 2 days ago.  Painful to touch.  Has attempted use of ibuprofen.  Believes he has possible cavity that has gotten worse.  He is established with dentist.  Denies fever  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   Undescended left testicle 1961   after trauma with dodgeball    Patient Active Problem List   Diagnosis Date Noted   Atherosclerosis of aorta (HCC) 04/30/2023   Agatston coronary artery calcium score greater than 400 04/24/2023   Family history of premature CAD 04/14/2023   S/P total knee arthroplasty, 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   Pulmonary granuloma (HCC) 09/05/2022   Pre-op evaluation 09/02/2022   Onychomycosis of great toe 04/09/2022   Chest discomfort 02/14/2022   Medicare annual wellness visit, initial 01/08/2022   GERD (gastroesophageal reflux disease) 06/10/2021   LAFB (left anterior fascicular block) 11/13/2020   Hydronephrosis with obstructing calculus 02/23/2020   Advanced directives, counseling/discussion 10/12/2019   History of colonic polyps    Polyp of sigmoid colon    Benign neoplasm of descending colon    Benign neoplasm of cecum    Right shoulder pain 08/09/2018   Left anterior knee pain 06/01/2017   Overweight with body mass index (BMI) 25.0-29.9 06/01/2017   Health  maintenance examination 03/13/2015   Undescended left testicle    Benign skin lesion of nose 10/30/2014   Erectile dysfunction 10/30/2014   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   Hyperlipidemia associated with type 2 diabetes mellitus (HCC)    Peripheral neuropathy 12/07/2013   Hypothyroidism 10/26/2013   Type 2 diabetes mellitus with other specified complication (HCC) 10/26/2013    Past Surgical History:  Procedure Laterality Date   COLONOSCOPY  2004;2009   first with b9 polyps, then diverticulosis no polyps, rpt 49yrs ([facility])   COLONOSCOPY WITH PROPOFOL N/A 08/30/2018   2 TA, 1 SSP, diverticulosis, rpt 5 yrs Servando Snare, Darren, MD)   KNEE CLOSED REDUCTION Right 01/01/2023   CLOSED MANIPULATION KNEE Juanell Fairly, MD)   MENISCUS REPAIR Left 2008   MENISCUS REPAIR Right 2002   POLYPECTOMY N/A 08/30/2018   Procedure: POLYPECTOMY;  Surgeon: Midge Minium, MD;  Location: [facility];  Service: Endoscopy;  Laterality: N/A;   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   Procedure: TOTAL KNEE ARTHROPLASTY;  Surgeon: Juanell Fairly, MD;  Location: [facility];  Service: Orthopedics;  Laterality: Right;       Home Medications    Prior to Admission medications   Medication Sig Start Date End Date Taking? Authorizing Provider  aspirin EC 81 MG tablet Take 1 tablet (81 mg total) by mouth daily. Swallow whole. 04/24/23  Yes Eustaquio Boyden, MD  atorvastatin (LIPITOR) 40 MG tablet  Take 1 tablet (40 mg total) by mouth daily. 07/01/23  Yes Debbe Odea, MD  Blood Glucose Monitoring Suppl (FREESTYLE LITE) w/Device KIT Use to check sugars daily and as needed E11.69 10/14/22  Yes Eustaquio Boyden, MD  glipiZIDE (GLUCOTROL XL) 5 MG 24 hr tablet Take 1 tablet (5 mg total) by mouth daily with breakfast. 01/12/23  Yes Eustaquio Boyden, MD  glucose blood (FREESTYLE LITE) test strip Use as instructed to check blood sugar 3 times a day 10/16/22  Yes Eustaquio Boyden, MD  levothyroxine (SYNTHROID)  88 MCG tablet Take 1 tablet (88 mcg total) by mouth daily with breakfast. 01/12/23  Yes Eustaquio Boyden, MD  metFORMIN (GLUCOPHAGE) 1000 MG tablet Take 1 tablet (1,000 mg total) by mouth 2 (two) times daily with a meal. 01/12/23  Yes Eustaquio Boyden, MD  Multiple Vitamins-Minerals (MULTIVITAMIN ADULTS 50+ PO) Take 1 tablet by mouth daily.   Yes [provider]  Omega-3 Fatty Acids (FISH OIL PO) Take 1 tablet by mouth daily.   Yes [provider]  omeprazole (PRILOSEC) 40 MG capsule Take 1 capsule (40 mg total) by mouth daily as needed. 01/12/23  Yes Eustaquio Boyden, MD  OZEMPIC, 1 MG/DOSE, 4 MG/3ML SOPN INJECT 1 MG WEEKLY AS DIRECTED 07/06/23  Yes Eustaquio Boyden, MD  tadalafil (CIALIS) 10 MG tablet TAKE ONE TABLET BY MOUTH EVERY OTHER DAY AS NEEDED FOR ERECTILE DYSFUNCTION 01/12/23   Eustaquio Boyden, MD  zolpidem (AMBIEN) 10 MG tablet TAKE 1 TABLET(10 MG) BY MOUTH AT BEDTIME AS NEEDED FOR SLEEP 03/25/23   Eustaquio Boyden, MD    Family History Family History  Problem Relation Age of Onset   Other Mother        blood disease, died 22 (?porphyria)   Heart disease Father    Stroke Father    Diabetes Father    Hypertension Sister    Stroke Sister 95   Thyroid disease Sister    Heart disease Brother    Heart disease Brother    Diabetes type II Brother    Diabetes type II Maternal Aunt    Cancer Neg Hx    CAD Neg Hx     Social History Social History   Tobacco Use   Smoking status: Never   Smokeless tobacco: Never  Vaping Use   Vaping status: Never Used  Substance Use Topics   Alcohol use: Not Currently    Comment: occassional   Drug use: No     Allergies   Lyrica [pregabalin]   Review of Systems Review of Systems   Physical Exam Triage Vital Signs ED Triage Vitals  Encounter Vitals Group     BP 09/06/23 0816 138/82     Systolic BP Percentile --      Diastolic BP Percentile --      Pulse Rate 09/06/23 0816 66     Resp 09/06/23 0816 16      Temp 09/06/23 0816 98.1 F (36.7 C)     Temp Source 09/06/23 0816 Oral     SpO2 09/06/23 0816 96 %     Weight --      Height --      Head Circumference --      Peak Flow --      Pain Score 09/06/23 0817 2     Pain Loc --      Pain Education --      Exclude from Growth Chart --    No data found.  Updated Vital Signs BP 138/82 (BP  Location: Left Arm)   Pulse 66   Temp 98.1 F (36.7 C) (Oral)   Resp 16   SpO2 96%   Visual Acuity Right Eye Distance:   Left Eye Distance:   Bilateral Distance:    Right Eye Near:   Left Eye Near:    Bilateral Near:     Physical Exam Constitutional:      Appearance: Normal appearance.  HENT:     Mouth/Throat:     Comments: Abscess present to the right internal cheek, mild to moderate gingival swelling along the right lower gumline, mild dental decay, pharynx clear without obstruction Eyes:     Extraocular Movements: Extraocular movements intact.  Pulmonary:     Effort: Pulmonary effort is normal.  Neurological:     Mental Status: He is alert and oriented to person, place, and time. Mental status is at baseline.      UC Treatments / Results  Labs (all labs ordered are listed, but only abnormal results are displayed) Labs Reviewed - No data to display  EKG   Radiology No results found.  Procedures Procedures (including critical care time)  Medications Ordered in UC Medications - No data to display  Initial Impression / Assessment and Plan / UC Course  I have reviewed the triage vital signs and the nursing notes.  Pertinent labs & imaging results that were available during my care of the patient were reviewed by me and considered in my medical decision making (see chart for details).  Internal right cheek abscess  Presentation consistent with infection most likely related to the teeth, discussed with patient, prescribed Augmentin and viscous lidocaine, recommended supportive measures and advised follow-up with  dentist Final Clinical Impressions(s) / UC Diagnoses   Final diagnoses:  None   Discharge Instructions   None    ED Prescriptions   None    PDMP not reviewed this encounter.   Valinda Hoar, [place] 09/06/23 [phone]

## 2023-09-11 ENCOUNTER — Ambulatory Visit (INDEPENDENT_AMBULATORY_CARE_PROVIDER_SITE_OTHER): Payer: Medicare PPO | Admitting: Family Medicine

## 2023-09-11 ENCOUNTER — Encounter: Payer: Self-pay | Admitting: Family Medicine

## 2023-09-11 VITALS — BP 124/68 | HR 62 | Temp 99.1°F | Ht 64.0 in | Wt 175.0 lb

## 2023-09-11 DIAGNOSIS — R931 Abnormal findings on diagnostic imaging of heart and coronary circulation: Secondary | ICD-10-CM

## 2023-09-11 DIAGNOSIS — Z7985 Long-term (current) use of injectable non-insulin antidiabetic drugs: Secondary | ICD-10-CM

## 2023-09-11 DIAGNOSIS — E785 Hyperlipidemia, unspecified: Secondary | ICD-10-CM | POA: Diagnosis not present

## 2023-09-11 DIAGNOSIS — E1169 Type 2 diabetes mellitus with other specified complication: Secondary | ICD-10-CM

## 2023-09-11 DIAGNOSIS — E66811 Obesity, class 1: Secondary | ICD-10-CM

## 2023-09-11 DIAGNOSIS — Z7984 Long term (current) use of oral hypoglycemic drugs: Secondary | ICD-10-CM | POA: Diagnosis not present

## 2023-09-11 LAB — LIPID PANEL
Cholesterol: 132 mg/dL (ref 0–200)
HDL: 52.7 mg/dL (ref >39.00)
LDL Cholesterol: 60 mg/dL (ref 0–99)
NonHDL: 79.06
Total CHOL/HDL Ratio: 3
Triglycerides: 97 mg/dL (ref 0.0–149.0)
VLDL: 19.4 mg/dL (ref 0.0–40.0)

## 2023-09-11 LAB — POCT GLYCOSYLATED HEMOGLOBIN (HGB A1C): Hemoglobin A1C: 8.4 % — AB (ref 4.0–5.6)

## 2023-09-11 MED ORDER — SEMAGLUTIDE (2 MG/DOSE) 8 MG/3ML ~~LOC~~ SOPN: 2.0000 mg | PEN_INJECTOR | SUBCUTANEOUS | 3 refills | Status: DC

## 2023-09-11 MED ORDER — DEXCOM G7 RECEIVER DEVI: 0 refills | Status: DC

## 2023-09-11 MED ORDER — DEXCOM G7 SENSOR MISC: 11 refills | Status: DC

## 2023-09-11 NOTE — Assessment & Plan Note (Signed)
Chronic on atorvastatin 40mg  daily - continue. Update FLP. The 10-year ASCVD risk score (Arnett DK, et al., 2019) is: 27.9%   Values used to calculate the score:     Age: 72 years     Sex: Male     Is Non-Hispanic African American: No     Diabetic: Yes     Tobacco smoker: No     Systolic Blood Pressure: 124 mmHg     Is BP treated: No     HDL Cholesterol: 56.2 mg/dL     Total Cholesterol: 155 mg/dL

## 2023-09-11 NOTE — Assessment & Plan Note (Addendum)
Chronic, deteriorated. Increase ozempic to 2mg  weekly. Continue other regimen. Foot exam today  He states Dexcom G7 CGM is covered by insurance - will send Rx for sensors and receiver.

## 2023-09-11 NOTE — Assessment & Plan Note (Signed)
Established with cardiology - appreciate their care. LDL goal <70.

## 2023-09-11 NOTE — Progress Notes (Signed)
Ph: [phone] Fax: [phone]   Patient ID: Kevin Suire., male    DOB: 1951-05-26, 72 y.o.   MRN: [id number]  This visit was conducted in person.  BP 124/68   Pulse 62   Temp 99.1 F (37.3 C) (Oral)   Ht 5\' 4"  (1.626 m)   Wt 175 lb (79.4 kg)   SpO2 94%   BMI 30.04 kg/m    CC: DM f/u visit  Subjective:   HPI: Kevin Sanangelo. is a 72 y.o. male presenting on 09/11/2023 for Medical Management of Chronic Issues (Here for DM f/u.)   Seen at urgent care with R internal cheek abscess treated with augmentin course and viscous lidocaine. Saw dentist - notes he needs root canal - upcoming appt end of January.   Saw cardiology for elevated coronary calcium score of 1061 - rec goal LDL <55. Lipitor was increased 12/2022. Requests updated FLP today.   Continues exercising every day.   DM - does regularly check sugars fasting 170s. Compliant with antihyperglycemic regimen which includes: glipizide XL 5mg  daily, metformin 1000mg  bid, ozempic 1mg  weekly. Denies low sugars or hypoglycemic symptoms. Denies paresthesias, blurry vision. Last diabetic eye exam 01/2023. Glucometer brand: freestyle lite. Last foot exam: DUE. DSME: completed remotely. Lab Results  Component Value Date   HGBA1C 8.4 (A) 09/11/2023  A1c 7.6 (03/2023) Diabetic Foot Exam - Simple   Simple Foot Form Diabetic Foot exam was performed with the following findings: Yes 09/11/2023  7:52 AM  Visual Inspection See comments: Yes Sensation Testing Intact to touch and monofilament testing bilaterally: Yes Pulse Check See comments: Yes Comments No claudication Diminished pedal pulses  Maceration between 4th/5th digits on left    Lab Results  Component Value Date   MICROALBUR 2.3 (H) 01/05/2023         Relevant past medical, surgical, family and social history reviewed and updated as indicated. Interim medical history since our last visit reviewed. Allergies and medications reviewed and updated. Outpatient  Medications Prior to Visit  Medication Sig Dispense Refill   amoxicillin-clavulanate (AUGMENTIN) 875-125 MG tablet Take 1 tablet by mouth every 12 (twelve) hours. 14 tablet 0   aspirin EC 81 MG tablet Take 1 tablet (81 mg total) by mouth daily. Swallow whole.     atorvastatin (LIPITOR) 40 MG tablet Take 1 tablet (40 mg total) by mouth daily. 90 tablet 0   Blood Glucose Monitoring Suppl (FREESTYLE LITE) w/Device KIT Use to check sugars daily and as needed E11.69 1 kit 0   glipiZIDE (GLUCOTROL XL) 5 MG 24 hr tablet Take 1 tablet (5 mg total) by mouth daily with breakfast. 90 tablet 4   glucose blood (FREESTYLE LITE) test strip Use as instructed to check blood sugar 3 times a day 300 each 3   levothyroxine (SYNTHROID) 88 MCG tablet Take 1 tablet (88 mcg total) by mouth daily with breakfast. 90 tablet 4   lidocaine (XYLOCAINE) 2 % solution Use as directed 15 mLs in the mouth or throat as needed for mouth pain. 100 mL 0   metFORMIN (GLUCOPHAGE) 1000 MG tablet Take 1 tablet (1,000 mg total) by mouth 2 (two) times daily with a meal. 180 tablet 4   Multiple Vitamins-Minerals (MULTIVITAMIN ADULTS 50+ PO) Take 1 tablet by mouth daily.     Omega-3 Fatty Acids (FISH OIL PO) Take 1 tablet by mouth daily.     omeprazole (PRILOSEC) 40 MG capsule Take 1 capsule (40 mg total) by mouth daily as needed. 90  capsule 2   tadalafil (CIALIS) 10 MG tablet TAKE ONE TABLET BY MOUTH EVERY OTHER DAY AS NEEDED FOR ERECTILE DYSFUNCTION 10 tablet 6   zolpidem (AMBIEN) 10 MG tablet TAKE 1 TABLET(10 MG) BY MOUTH AT BEDTIME AS NEEDED FOR SLEEP 20 tablet 0   OZEMPIC, 1 MG/DOSE, 4 MG/3ML SOPN INJECT 1 MG WEEKLY AS DIRECTED 9 mL 3   No facility-administered medications prior to visit.     Per HPI unless specifically indicated in ROS section below Review of Systems  Objective:  BP 124/68   Pulse 62   Temp 99.1 F (37.3 C) (Oral)   Ht 5\' 4"  (1.626 m)   Wt 175 lb (79.4 kg)   SpO2 94%   BMI 30.04 kg/m   Wt Readings from  Last 3 Encounters:  09/11/23 175 lb (79.4 kg)  08/31/23 174 lb (78.9 kg)  07/01/23 166 lb 6.4 oz (75.5 kg)      Physical Exam Vitals and nursing note reviewed.  Constitutional:      Appearance: Normal appearance. He is not ill-appearing.  Eyes:     Extraocular Movements: Extraocular movements intact.     Conjunctiva/sclera: Conjunctivae normal.     Pupils: Pupils are equal, round, and reactive to light.  Neck:     Thyroid: No thyroid mass or thyromegaly.  Cardiovascular:     Rate and Rhythm: Normal rate and regular rhythm.     Pulses: Normal pulses.     Heart sounds: Normal heart sounds. No murmur heard. Pulmonary:     Effort: Pulmonary effort is normal. No respiratory distress.     Breath sounds: Normal breath sounds. No wheezing, rhonchi or rales.  Musculoskeletal:     Right lower leg: No edema.     Left lower leg: No edema.     Comments: See HPI for foot exam if done  Skin:    General: Skin is warm and dry.     Findings: No rash.  Neurological:     Mental Status: He is alert.  Psychiatric:        Mood and Affect: Mood normal.        Behavior: Behavior normal.       Results for orders placed or performed in visit on 09/11/23  POCT glycosylated hemoglobin (Hb A1C)   Collection Time: 09/11/23  7:38 AM  Result Value Ref Range   Hemoglobin A1C 8.4 (A) 4.0 - 5.6 %   HbA1c POC (<> result, manual entry)     HbA1c, POC (prediabetic range)     HbA1c, POC (controlled diabetic range)     Lab Results  Component Value Date   TSH 1.42 02/23/2023    Assessment & Plan:   Problem List Items Addressed This Visit     Type 2 diabetes mellitus with other specified complication (HCC) - Primary   Chronic, deteriorated. Increase ozempic to 2mg  weekly. Continue other regimen. Foot exam today  He states Dexcom G7 CGM is covered by insurance - will send Rx for sensors and receiver.       Relevant Medications   Semaglutide, 2 MG/DOSE, 8 MG/3ML SOPN   Other Relevant Orders    POCT glycosylated hemoglobin (Hb A1C) (Completed)   Lipid panel   Hyperlipidemia associated with type 2 diabetes mellitus (HCC)   Chronic on atorvastatin 40mg  daily - continue. Update FLP. The 10-year ASCVD risk score (Arnett DK, et al., 2019) is: 27.9%   Values used to calculate the score:     Age: 30 years  Sex: Male     Is Non-Hispanic African American: No     Diabetic: Yes     Tobacco smoker: No     Systolic Blood Pressure: 124 mmHg     Is BP treated: No     HDL Cholesterol: 56.2 mg/dL     Total Cholesterol: 155 mg/dL       Relevant Medications   Semaglutide, 2 MG/DOSE, 8 MG/3ML SOPN   Obesity, Class I, BMI 30-34.9   Weight gain noted , discussed. Increase ozempic to 2mg  weekly.      Agatston coronary artery calcium score greater than 400   Established with cardiology - appreciate their care. LDL goal <70.         Meds ordered this encounter  Medications   Semaglutide, 2 MG/DOSE, 8 MG/3ML SOPN    Sig: Inject 2 mg as directed once a week.    Dispense:  9 mL    Refill:  3    Note new dose   Continuous Glucose Sensor (DEXCOM G7 SENSOR) MISC    Sig: Use to check sugars as directed E11.69    Dispense:  3 each    Refill:  11   Continuous Glucose Receiver (DEXCOM G7 RECEIVER) DEVI    Sig: Use to check sugars daily E11.69    Dispense:  1 each    Refill:  0    Orders Placed This Encounter  Procedures   Lipid panel   POCT glycosylated hemoglobin (Hb A1C)    Patient Instructions  Labs today  Dexcom G7 sensors and receiver sent to pharmacy.  A1c was elevated again today - to 8.4.  Increase ozempic to 2mg  weekly, monitoring for side effects.  Good to see you today Return in 3 months for diabetes follow up visit   Follow up plan: Return in about 3 months (around 12/10/2023) for follow up visit.  Eustaquio Boyden, MD

## 2023-09-11 NOTE — Patient Instructions (Signed)
Labs today  Dexcom G7 sensors and receiver sent to pharmacy.  A1c was elevated again today - to 8.4.  Increase ozempic to 2mg  weekly, monitoring for side effects.  Good to see you today Return in 3 months for diabetes follow up visit

## 2023-09-11 NOTE — Assessment & Plan Note (Signed)
Weight gain noted , discussed. Increase ozempic to 2mg  weekly.

## 2023-09-14 ENCOUNTER — Telehealth: Payer: Self-pay

## 2023-09-14 NOTE — Telephone Encounter (Signed)
Pharmacy Patient Advocate Encounter   Received notification from CoverMyMeds that prior authorization for [facility] G7 RECEIVER is required/requested.   Insurance verification completed.   The patient is insured through [employer] .   Per test claim: PA required; PA submitted to above mentioned insurance via CoverMyMeds Key/confirmation #/EOC (Key: [id number]) Status is pending

## 2023-09-18 ENCOUNTER — Other Ambulatory Visit: Payer: Self-pay

## 2023-09-18 MED ORDER — ATORVASTATIN CALCIUM 40 MG PO TABS: 40.0000 mg | ORAL_TABLET | Freq: Every day | ORAL | 0 refills | Status: DC

## 2023-09-21 ENCOUNTER — Telehealth: Payer: Self-pay

## 2023-09-21 NOTE — Telephone Encounter (Signed)
 Pharmacy Patient Advocate Encounter  Received notification from [employer] that Prior Authorization for Kevin Sheppard has been DENIED.  Full denial letter will be uploaded to the media tab. See denial reason below.  PT MUST BE ON INSULIN  FOR INS TO COVER       PA #/Case ID/Reference #: (Key: [id number])

## 2023-09-21 NOTE — Telephone Encounter (Addendum)
 Plz notify Dexcom G7 was denied by his insurance as he's not on daily insulin shots.  If he wants to use, he will need to price out of pocket.

## 2023-09-22 NOTE — Telephone Encounter (Signed)
 Spoke with patient about denial. Does not wish to start insulin shots.

## 2023-09-29 ENCOUNTER — Other Ambulatory Visit: Payer: Self-pay | Admitting: Family Medicine

## 2023-09-29 DIAGNOSIS — N529 Male erectile dysfunction, unspecified: Secondary | ICD-10-CM

## 2023-10-02 NOTE — Telephone Encounter (Signed)
 error

## 2023-10-05 DIAGNOSIS — M1712 Unilateral primary osteoarthritis, left knee: Secondary | ICD-10-CM | POA: Diagnosis not present

## 2023-11-08 ENCOUNTER — Encounter: Payer: Self-pay | Admitting: Family Medicine

## 2023-11-09 ENCOUNTER — Encounter: Payer: Self-pay | Admitting: Family Medicine

## 2023-11-09 ENCOUNTER — Ambulatory Visit: Payer: Self-pay | Admitting: Family Medicine

## 2023-11-09 ENCOUNTER — Ambulatory Visit (INDEPENDENT_AMBULATORY_CARE_PROVIDER_SITE_OTHER): Payer: Medicare PPO | Admitting: Family Medicine

## 2023-11-09 VITALS — BP 122/82 | HR 59 | Temp 98.1°F | Ht 64.0 in | Wt 161.0 lb

## 2023-11-09 DIAGNOSIS — Z7985 Long-term (current) use of injectable non-insulin antidiabetic drugs: Secondary | ICD-10-CM

## 2023-11-09 DIAGNOSIS — G6289 Other specified polyneuropathies: Secondary | ICD-10-CM

## 2023-11-09 DIAGNOSIS — Z7984 Long term (current) use of oral hypoglycemic drugs: Secondary | ICD-10-CM | POA: Diagnosis not present

## 2023-11-09 DIAGNOSIS — E039 Hypothyroidism, unspecified: Secondary | ICD-10-CM

## 2023-11-09 DIAGNOSIS — E1169 Type 2 diabetes mellitus with other specified complication: Secondary | ICD-10-CM | POA: Diagnosis not present

## 2023-11-09 MED ORDER — GABAPENTIN 100 MG PO CAPS: 100.0000 mg | ORAL_CAPSULE | Freq: Every day | ORAL | 3 refills | Status: DC

## 2023-11-09 NOTE — Patient Instructions (Addendum)
 Labs today  Stay off Neurgenix supplement which may be contributing to symptoms.  Let me know if not better off this.  May take gabapentin 100-200 mg at night time for neuropathy symptoms.

## 2023-11-09 NOTE — Assessment & Plan Note (Signed)
 Chronic, anticipate improved control after healthy diet and lifestyle choices over the past 2 months with resultant 15 lb weight loss. Update fructosamine levels today

## 2023-11-09 NOTE — Progress Notes (Signed)
 Ph: [phone] Fax: [phone]   Patient ID: Kevin Sheppard., male    DOB: [birth year], 73 y.o.   MRN: [id number]  This visit was conducted in person.  BP 122/82   Pulse (!) 59   Temp 98.1 F (36.7 C) (Oral)   Ht 5\' 4"  (1.626 m)   Wt 161 lb (73 kg)   SpO2 95%   BMI 27.64 kg/m    CC: worsening tingling numbness and burning pain to legs  Subjective:   HPI: Kevin Sheppard. is a 73 y.o. male presenting on 11/09/2023 for Numbness (C/o worsening numbness, tingling and burning in B legs.)   H/o neuropathy back in 2015 that actually resolved after starting levothyroxine - thought hypothyroid neuropathy.   Also has h/o diabetes managed with glipizide XL 5mg  daily, metformin 1000mg  bid and ozempic 2mg  weekly. Sugars have ben well controlled 100-110s over the past 3 weeks.  Lab Results  Component Value Date   HGBA1C 8.4 (A) 09/11/2023   No results found for: "VITAMINB12"  Now over the past few weeks notes progressive itching/burning discomfort to bilateral lower extremities. This starts anterior shin and extends down to ankle. Some rash associated with this. Fingers/toes not affected. No perioral numbness. No tingling or numbness. No associated unsteadiness/imbalance. Treating with biofreeze pain relief gel with benefit.   No new lotions, detergents, soaps or shampoos, foods, medicines.  He did recently start taking a Neugenix testosterone booster with b12 and b6 vitamins as well as zinc - this may have precipitated symptoms.   15 lb weight loss in the past 2 months. He has been trying to lose weight - <2000cal/day diet. Continues swimming and exercising regularly as well.   Lyrica previously caused rash.  Gabapentin 300mg  tablets was tolerated well.      Relevant past medical, surgical, family and social history reviewed and updated as indicated. Interim medical history since our last visit reviewed. Allergies and medications reviewed and updated. Outpatient Medications  Prior to Visit  Medication Sig Dispense Refill   amoxicillin-clavulanate (AUGMENTIN) 875-125 MG tablet Take 1 tablet by mouth every 12 (twelve) hours. 14 tablet 0   aspirin EC 81 MG tablet Take 1 tablet (81 mg total) by mouth daily. Swallow whole.     atorvastatin (LIPITOR) 40 MG tablet Take 1 tablet (40 mg total) by mouth daily. 90 tablet 0   Blood Glucose Monitoring Suppl (FREESTYLE LITE) w/Device KIT Use to check sugars daily and as needed E11.69 1 kit 0   Continuous Glucose Receiver (DEXCOM G7 RECEIVER) DEVI Use to check sugars daily E11.69 1 each 0   Continuous Glucose Sensor (DEXCOM G7 SENSOR) MISC Use to check sugars as directed E11.69 3 each 11   glipiZIDE (GLUCOTROL XL) 5 MG 24 hr tablet Take 1 tablet (5 mg total) by mouth daily with breakfast. 90 tablet 4   glucose blood (FREESTYLE LITE) test strip Use as instructed to check blood sugar 3 times a day 300 each 3   levothyroxine (SYNTHROID) 88 MCG tablet Take 1 tablet (88 mcg total) by mouth daily with breakfast. 90 tablet 4   lidocaine (XYLOCAINE) 2 % solution Use as directed 15 mLs in the mouth or throat as needed for mouth pain. 100 mL 0   metFORMIN (GLUCOPHAGE) 1000 MG tablet Take 1 tablet (1,000 mg total) by mouth 2 (two) times daily with a meal. 180 tablet 4   Multiple Vitamins-Minerals (MULTIVITAMIN ADULTS 50+ PO) Take 1 tablet by mouth daily.     Omega-3  Fatty Acids (FISH OIL PO) Take 1 tablet by mouth daily.     Semaglutide, 2 MG/DOSE, 8 MG/3ML SOPN Inject 2 mg as directed once a week. 9 mL 3   tadalafil (CIALIS) 10 MG tablet TAKE 1 TABLET BY MOUTH EVERY OTHER DAY AS NEEDED FOR ERECTILE DYSFUNCTION 10 tablet 6   zolpidem (AMBIEN) 10 MG tablet TAKE 1 TABLET(10 MG) BY MOUTH AT BEDTIME AS NEEDED FOR SLEEP 20 tablet 0   omeprazole (PRILOSEC) 40 MG capsule Take 1 capsule (40 mg total) by mouth daily as needed. 90 capsule 2   No facility-administered medications prior to visit.     Per HPI unless specifically indicated in ROS  section below Review of Systems  Objective:  BP 122/82   Pulse (!) 59   Temp 98.1 F (36.7 C) (Oral)   Ht 5\' 4"  (1.626 m)   Wt 161 lb (73 kg)   SpO2 95%   BMI 27.64 kg/m   Wt Readings from Last 3 Encounters:  11/09/23 161 lb (73 kg)  09/11/23 175 lb (79.4 kg)  08/31/23 174 lb (78.9 kg)      Physical Exam Vitals and nursing note reviewed.  Constitutional:      Appearance: Normal appearance. He is not ill-appearing.  HENT:     Head: Normocephalic and atraumatic.     Mouth/Throat:     Mouth: Mucous membranes are moist.     Pharynx: Oropharynx is clear. No oropharyngeal exudate or posterior oropharyngeal erythema.  Eyes:     Extraocular Movements: Extraocular movements intact.     Pupils: Pupils are equal, round, and reactive to light.  Cardiovascular:     Rate and Rhythm: Normal rate and regular rhythm.     Pulses: Normal pulses.     Heart sounds: Normal heart sounds. No murmur heard. Pulmonary:     Effort: Pulmonary effort is normal. No respiratory distress.     Breath sounds: Normal breath sounds. No wheezing, rhonchi or rales.  Musculoskeletal:        General: No swelling.     Right lower leg: No edema.     Left lower leg: No edema.     Comments: Tr pedal edema   Skin:    General: Skin is warm and dry.     Findings: Erythema and rash present.     Comments: Faint papular rash to bilateral anterior shins with surrounding excoriations  Neurological:     Mental Status: He is alert.     Sensory: Sensation is intact.     Motor: Motor function is intact.     Coordination: Coordination is intact.     Gait: Gait is intact.     Comments:  CN grossly intact No significant diminished sensation or strength on extremity testing        Results for orders placed or performed in visit on 09/11/23  POCT glycosylated hemoglobin (Hb A1C)   Collection Time: 09/11/23  7:38 AM  Result Value Ref Range   Hemoglobin A1C 8.4 (A) 4.0 - 5.6 %   HbA1c POC (<> result, manual entry)      HbA1c, POC (prediabetic range)     HbA1c, POC (controlled diabetic range)    Lipid panel   Collection Time: 09/11/23  7:57 AM  Result Value Ref Range   Cholesterol 132 0 - 200 mg/dL   Triglycerides 64.4 0.0 - 149.0 mg/dL   HDL 03.47 >42.59 mg/dL   VLDL 56.3 0.0 - 87.5 mg/dL   LDL Cholesterol 60  0 - 99 mg/dL   Total CHOL/HDL Ratio 3    NonHDL 79.06    Lab Results  Component Value Date   NA 137 01/05/2023   CL 100 01/05/2023   K 4.5 01/05/2023   CO2 24 01/05/2023   BUN 24 (H) 01/05/2023   CREATININE 0.78 01/05/2023   GFR 90.29 01/05/2023   CALCIUM 10.5 01/05/2023   ALBUMIN 4.7 01/05/2023   GLUCOSE 252 (H) 01/05/2023    Assessment & Plan:   Problem List Items Addressed This Visit     Hypothyroidism   Last visit we dropped levothyroxine to daily. Update levels today.       Type 2 diabetes mellitus with other specified complication (HCC)   Chronic, anticipate improved control after healthy diet and lifestyle choices over the past 2 months with resultant 15 lb weight loss. Update fructosamine levels today       Relevant Orders   Fructosamine   Basic metabolic panel   Peripheral neuropathy - Primary   Describes neuropathy type pain - burning discomfort.  Isolated to anterior shins down to ankles.  ?supplement related - Neurogenix supplement contains b6 and b12 vitamins as well as several other unknown ingredients. Recommend he stop this.  Previous hypothyroidism presented with neuropathy - update labs today including fructosamine b12 and tsh. He agrees with plan, will let me know if recurrent issue despite holding latest new supplement.       Relevant Medications   gabapentin (NEURONTIN) 100 MG capsule   Other Relevant Orders   Fructosamine   Vitamin B12   TSH   Basic metabolic panel     Meds ordered this encounter  Medications   gabapentin (NEURONTIN) 100 MG capsule    Sig: Take 1-2 capsules (100-200 mg total) by mouth at bedtime.    Dispense:  60  capsule    Refill:  3    Orders Placed This Encounter  Procedures   Fructosamine   Vitamin B12   TSH   Basic metabolic panel    Patient Instructions  Labs today  Stay off Neurgenix supplement which may be contributing to symptoms.  Let me know if not better off this.  May take gabapentin 100-200 mg at night time for neuropathy symptoms.   Follow up plan: Return if symptoms worsen or fail to improve.  Eustaquio Boyden, MD

## 2023-11-09 NOTE — Assessment & Plan Note (Signed)
 Describes neuropathy type pain - burning discomfort.  Isolated to anterior shins down to ankles.  ?supplement related - Neurogenix supplement contains b6 and b12 vitamins as well as several other unknown ingredients. Recommend he stop this.  Previous hypothyroidism presented with neuropathy - update labs today including fructosamine b12 and tsh. He agrees with plan, will let me know if recurrent issue despite holding latest new supplement.  Will also start gabapentin 100-200mg  to use nightly PRN

## 2023-11-09 NOTE — Telephone Encounter (Signed)
 Pt has OV today at 2:00 with Dr Reece Agar.

## 2023-11-09 NOTE — Telephone Encounter (Signed)
 Copied from CRM [id number]. Topic: Clinical - Red Word Triage >> Nov 09, 2023  7:43 AM Kevin Sheppard wrote: Red Word that prompted transfer to Nurse Triage: Patient states he is having worsening neuropathy with numbness, burning, and tingling.  Chief Complaint: numbness, burning, and tingling. Symptoms: numbness, burning, and tingling. Frequency: started two weeks ago Pertinent Negatives: Patient denies difficulty breathing, sob, cp, headache, difficulty walking Disposition: [] ED /[] Urgent Care (no appt availability in office) / [x] Appointment(In office/virtual)/ []  [facility]/ [] Home Care/ [] Refused Recommended Disposition /[] [facility]/ []  Follow-up with PCP Additional Notes: HX Neuropathy; apt made for today; care advice given, denies questions, instructed to go to the er if becomes worse.   Reason for Disposition  [1] Numbness (i.e., loss of sensation) of the face, arm / hand, or leg / foot on one side of the body AND [2] gradual onset (e.g., days to weeks) AND [3] present now  Answer Assessment - Initial Assessment Questions 1. SYMPTOM: "What is the main symptom you are concerned about?" (e.g., weakness, numbness)     Numbness, burning and tingling about two weeks ago 2. ONSET: "When did this start?" (minutes, hours, days; while sleeping)     Two weeks ago 3. LAST NORMAL: "When was the last time you (the patient) were normal (no symptoms)?"     Two weeks ago 4. PATTERN "Does this come and go, or has it been constant since it started?"  "Is it present now?"     Comes and goes but is constant 5. CARDIAC SYMPTOMS: "Have you had any of the following symptoms: chest pain, difficulty breathing, palpitations?"     denies 6. NEUROLOGIC SYMPTOMS: "Have you had any of the following symptoms: headache, dizziness, vision loss, double vision, changes in speech, unsteady on your feet?"     denies 7. OTHER SYMPTOMS: "Do you have any other symptoms?"     Denies.  8. PREGNANCY:  "Is there any chance you are pregnant?" "When was your last menstrual period?"     na  Protocols used: Neurologic Deficit-A-AH

## 2023-11-09 NOTE — Assessment & Plan Note (Signed)
 Last visit we dropped levothyroxine to daily. Update levels today.

## 2023-11-10 LAB — BASIC METABOLIC PANEL
BUN: 24 mg/dL — ABNORMAL HIGH (ref 6–23)
CO2: 24 meq/L (ref 19–32)
Calcium: 10 mg/dL (ref 8.4–10.5)
Chloride: 103 meq/L (ref 96–112)
Creatinine, Ser: 0.69 mg/dL (ref 0.40–1.50)
GFR: 93.14 mL/min (ref >60.00)
Glucose, Bld: 151 mg/dL — ABNORMAL HIGH (ref 70–99)
Potassium: 3.9 meq/L (ref 3.5–5.1)
Sodium: 138 meq/L (ref 135–145)

## 2023-11-10 LAB — VITAMIN B12: Vitamin B-12: 505 pg/mL (ref 211–911)

## 2023-11-10 LAB — TSH: TSH: 1.7 u[IU]/mL (ref 0.35–5.50)

## 2023-11-13 ENCOUNTER — Encounter: Payer: Self-pay | Admitting: Family Medicine

## 2023-11-15 LAB — FRUCTOSAMINE: Fructosamine: 313 umol/L — ABNORMAL HIGH (ref 205–285)

## 2023-11-16 ENCOUNTER — Encounter: Payer: Self-pay | Admitting: Family Medicine

## 2023-11-16 ENCOUNTER — Ambulatory Visit (INDEPENDENT_AMBULATORY_CARE_PROVIDER_SITE_OTHER): Admitting: Family Medicine

## 2023-11-16 VITALS — BP 124/74 | HR 64 | Temp 98.5°F | Ht 64.0 in | Wt 163.2 lb

## 2023-11-16 DIAGNOSIS — R21 Rash and other nonspecific skin eruption: Secondary | ICD-10-CM | POA: Diagnosis not present

## 2023-11-16 MED ORDER — TRIAMCINOLONE ACETONIDE 0.1 % EX CREA: 1.0000 | TOPICAL_CREAM | Freq: Two times a day (BID) | CUTANEOUS | 1 refills | Status: DC | PRN

## 2023-11-16 MED ORDER — CETIRIZINE HCL 10 MG PO TABS: 10.0000 mg | ORAL_TABLET | Freq: Every day | ORAL | Status: AC

## 2023-11-16 NOTE — Patient Instructions (Addendum)
 I would take zyrtec daily if needed for itching then use triamcinolone cream if needed.   Take care.  Glad to see you. Done use nervive in the meantime.    When better, retry gabapentin if needed.   Let us know how much zinc you are taking and we can add it to your list.

## 2023-11-16 NOTE — Progress Notes (Unsigned)
 Rash.  He had burning pain in the R mid shin, then similar on the L mid shin.   D/w PCP last week, started rx for gabapentin.  No rash at the OV last week.  Rash only started after using nervive- he used that when he had forgotten his gabapentin.  Nervive helped with the pain but then the rash started.    He was historically able to tolerate gabapentin.  Took benadryl in the meantime.   Rash was on the B shins, thighs and buttocks.    No lip and tongue swelling.  No wheeze.    Rash is better today, minimal punctate lesions on the R mid shin.  Blanching.    He had used combination of tadalafil/oxytocin per online pharmacy but that didn't correlate to the timeline of the rash/neuropathy.    He is already taking zyrtec.   Meds, vitals, and allergies reviewed.   ROS: Per HPI unless specifically indicated in ROS section   No lip or tongue swelling.  No stridor.

## 2023-11-17 ENCOUNTER — Other Ambulatory Visit: Payer: Self-pay

## 2023-11-18 DIAGNOSIS — E113393 Type 2 diabetes mellitus with moderate nonproliferative diabetic retinopathy without macular edema, bilateral: Secondary | ICD-10-CM | POA: Diagnosis not present

## 2023-11-18 DIAGNOSIS — R21 Rash and other nonspecific skin eruption: Secondary | ICD-10-CM | POA: Insufficient documentation

## 2023-11-18 LAB — HM DIABETES EYE EXAM

## 2023-11-18 NOTE — Assessment & Plan Note (Signed)
 Likely local recation from nervive.   I would take zyrtec daily if needed for itching then use triamcinolone cream if needed.   Done use nervive in the meantime.   When better, retry gabapentin if needed.   Update [place] as needed.  He agrees.

## 2023-12-07 ENCOUNTER — Encounter: Payer: Self-pay | Admitting: Family Medicine

## 2023-12-07 ENCOUNTER — Ambulatory Visit (INDEPENDENT_AMBULATORY_CARE_PROVIDER_SITE_OTHER): Payer: Medicare PPO | Admitting: Family Medicine

## 2023-12-07 ENCOUNTER — Telehealth: Payer: Self-pay | Admitting: Family Medicine

## 2023-12-07 VITALS — BP 116/68 | HR 67 | Temp 98.1°F | Ht 64.0 in | Wt 157.2 lb

## 2023-12-07 DIAGNOSIS — Z7984 Long term (current) use of oral hypoglycemic drugs: Secondary | ICD-10-CM | POA: Diagnosis not present

## 2023-12-07 DIAGNOSIS — M25562 Pain in left knee: Secondary | ICD-10-CM

## 2023-12-07 DIAGNOSIS — G8929 Other chronic pain: Secondary | ICD-10-CM

## 2023-12-07 DIAGNOSIS — E1169 Type 2 diabetes mellitus with other specified complication: Secondary | ICD-10-CM

## 2023-12-07 LAB — POCT GLYCOSYLATED HEMOGLOBIN (HGB A1C): Hemoglobin A1C: 7.2 % — AB (ref 4.0–5.6)

## 2023-12-07 MED ORDER — TRIAMCINOLONE ACETONIDE 0.1 % EX CREA: 1.0000 | TOPICAL_CREAM | Freq: Two times a day (BID) | CUTANEOUS | 1 refills | Status: DC | PRN

## 2023-12-07 NOTE — Progress Notes (Signed)
 Ph: [phone] Fax: [phone]   Patient ID: Kevin Szilagyi., male    DOB: 21-Jul-1951, 73 y.o.   MRN: [id number]  This visit was conducted in person.  BP 116/68   Pulse 67   Temp 98.1 F (36.7 C) (Oral)   Ht 5\' 4"  (1.626 m)   Wt 157 lb 4 oz (71.3 kg)   SpO2 95%   BMI 26.99 kg/m    CC:  3 mo DM f/u visit  Subjective:   HPI: Kevin Eyer. is a 73 y.o. male presenting on 12/07/2023 for Medical Management of Chronic Issues (Here for 3 mo DM f/u.)   Considering L knee surgery 02/2024.   DM - does regularly check sugars 100-120s. Limiting cals/day<2000. Compliant with antihyperglycemic regimen which includes: glipizide XL 5mg  daily, metformin 1000mg  bid, ozempic 2mg  weekly. Denies low sugars or hypoglycemic symptoms. Denies blurry vision. Notes paresthesias to feet. Last diabetic eye exam 01/2023 - will bring [place] updated records. Glucometer brand: G7 Dexcom. Last foot exam: 08/2023. DSME: 2011 at [facility]. CGM data: Dexcom G7 Did not bring sensor/readings today.  Lab Results  Component Value Date   HGBA1C 7.2 (A) 12/07/2023   Diabetic Foot Exam - Simple   No data filed    Lab Results  Component Value Date   MICROALBUR 2.3 (H) 01/05/2023    Last visit we started gabapentin 100-200mg  nightly PRN for worsening leg itching and some neuropathy. Recent labs overall reassuring - TSH, B12, fructosamine (A1c equivalent of 7.6%)  He did break out to Nervive cream from [facility].   Discussed recently discovered [employer] [facility] laboratory miscalculation - the UACR calculation in the software of the system was incorrect but the absolute levels of microalbumin and creatinine were correct. His levels have stayed normal after correcting for calculation error.  H/o dizziness to antihypertensives. Avoiding ACEI/ARB for this reason.   Due for colonoscopy - desires to postpone at this time, wants to have knee surgery first. Will reassess at CPE     Relevant past medical, surgical, family and  social history reviewed and updated as indicated. Interim medical history since our last visit reviewed. Allergies and medications reviewed and updated. Outpatient Medications Prior to Visit  Medication Sig Dispense Refill   aspirin EC 81 MG tablet Take 1 tablet (81 mg total) by mouth daily. Swallow whole.     atorvastatin (LIPITOR) 40 MG tablet Take 1 tablet (40 mg total) by mouth daily. 90 tablet 0   Blood Glucose Monitoring Suppl (FREESTYLE LITE) w/Device KIT Use to check sugars daily and as needed E11.69 1 kit 0   cetirizine (ZYRTEC) 10 MG tablet Take 1 tablet (10 mg total) by mouth daily.     gabapentin (NEURONTIN) 100 MG capsule Take 1-2 capsules (100-200 mg total) by mouth at bedtime. 60 capsule 3   glipiZIDE (GLUCOTROL XL) 5 MG 24 hr tablet Take 1 tablet (5 mg total) by mouth daily with breakfast. 90 tablet 4   glucose blood (FREESTYLE LITE) test strip Use as instructed to check blood sugar 3 times a day 300 each 3   levothyroxine (SYNTHROID) 88 MCG tablet Take 1 tablet (88 mcg total) by mouth daily with breakfast. 90 tablet 4   metFORMIN (GLUCOPHAGE) 1000 MG tablet Take 1 tablet (1,000 mg total) by mouth 2 (two) times daily with a meal. 180 tablet 4   Multiple Vitamins-Minerals (MULTIVITAMIN ADULTS 50+ PO) Take 1 tablet by mouth daily.     Omega-3 Fatty Acids (FISH OIL PO) Take  1 tablet by mouth daily.     Semaglutide, 2 MG/DOSE, 8 MG/3ML SOPN Inject 2 mg as directed once a week. 9 mL 3   tadalafil (CIALIS) 10 MG tablet TAKE 1 TABLET BY MOUTH EVERY OTHER DAY AS NEEDED FOR ERECTILE DYSFUNCTION 10 tablet 6   zinc gluconate 50 MG tablet Take 50 mg by mouth daily.     zolpidem (AMBIEN) 10 MG tablet TAKE 1 TABLET(10 MG) BY MOUTH AT BEDTIME AS NEEDED FOR SLEEP 20 tablet 0   triamcinolone cream (KENALOG) 0.1 % Apply 1 Application topically 2 (two) times daily as needed. 30 g 1   Continuous Glucose Receiver (DEXCOM G7 RECEIVER) DEVI Use to check sugars daily E11.69 1 each 0   Continuous Glucose  Sensor (DEXCOM G7 SENSOR) MISC Use to check sugars as directed E11.69 3 each 11   No facility-administered medications prior to visit.     Per HPI unless specifically indicated in ROS section below Review of Systems  Objective:  BP 116/68   Pulse 67   Temp 98.1 F (36.7 C) (Oral)   Ht 5\' 4"  (1.626 m)   Wt 157 lb 4 oz (71.3 kg)   SpO2 95%   BMI 26.99 kg/m   Wt Readings from Last 3 Encounters:  12/07/23 157 lb 4 oz (71.3 kg)  11/16/23 163 lb 3.2 oz (74 kg)  11/09/23 161 lb (73 kg)      Physical Exam Vitals and nursing note reviewed.  Constitutional:      Appearance: Normal appearance. He is not ill-appearing.  Eyes:     Extraocular Movements: Extraocular movements intact.     Conjunctiva/sclera: Conjunctivae normal.     Pupils: Pupils are equal, round, and reactive to light.  Cardiovascular:     Rate and Rhythm: Normal rate and regular rhythm.     Pulses: Normal pulses.     Heart sounds: Normal heart sounds. No murmur heard. Pulmonary:     Effort: Pulmonary effort is normal. No respiratory distress.     Breath sounds: Normal breath sounds. No wheezing, rhonchi or rales.  Musculoskeletal:     Right lower leg: No edema.     Left lower leg: No edema.     Comments: See HPI for foot exam if done  Skin:    General: Skin is warm and dry.     Findings: No rash.  Neurological:     Mental Status: He is alert.  Psychiatric:        Mood and Affect: Mood normal.        Behavior: Behavior normal.       Results for orders placed or performed in visit on 12/07/23  POCT glycosylated hemoglobin (Hb A1C)   Collection Time: 12/07/23  3:54 PM  Result Value Ref Range   Hemoglobin A1C 7.2 (A) 4.0 - 5.6 %   HbA1c POC (<> result, manual entry)     HbA1c, POC (prediabetic range)     HbA1c, POC (controlled diabetic range)     Lab Results  Component Value Date   CHOL 132 09/11/2023   HDL 52.70 09/11/2023   LDLCALC 60 09/11/2023   TRIG 97.0 09/11/2023   CHOLHDL 3 09/11/2023    Assessment & Plan:   Problem List Items Addressed This Visit     Type 2 diabetes mellitus with other specified complication (HCC) - Primary   Chronic, continued improvement noted.  Continue current regimen.  Avoiding ACEI/ARB in h/o orthostatic dizziness when antihypertensive previously tried  Relevant Orders   POCT glycosylated hemoglobin (Hb A1C) (Completed)   Chronic pain of left knee   Planning return to ortho for further evaluation - thinks may want to proceed with knee surgery 02/2024        Meds ordered this encounter  Medications   triamcinolone cream (KENALOG) 0.1 %    Sig: Apply 1 Application topically 2 (two) times daily as needed.    Dispense:  30 g    Refill:  1    Orders Placed This Encounter  Procedures   POCT glycosylated hemoglobin (Hb A1C)    Patient Instructions  You are doing well - overall improving sugar control.  Continue current medicines.  Continue working on low sugar low carb diabetic diet. Handout provided today.  Good to see you  Return for previously scheduled physical.   Follow up plan: Return if symptoms worsen or fail to improve.  Eustaquio Boyden, MD

## 2023-12-07 NOTE — Assessment & Plan Note (Addendum)
 Chronic, continued improvement noted.  Continue current regimen.  Avoiding ACEI/ARB in h/o orthostatic dizziness when antihypertensive previously tried

## 2023-12-07 NOTE — Assessment & Plan Note (Signed)
 Planning return to ortho for further evaluation - thinks may want to proceed with knee surgery 02/2024

## 2023-12-07 NOTE — Patient Instructions (Addendum)
 You are doing well - overall improving sugar control.  Continue current medicines.  Continue working on low sugar low carb diabetic diet. Handout provided today.  Good to see you  Return for previously scheduled physical.

## 2023-12-07 NOTE — Telephone Encounter (Signed)
Placed report in Dr. G's box.  

## 2023-12-07 NOTE — Telephone Encounter (Signed)
 Patient dropped off [facility] exam form off at front desk placed in provider box

## 2023-12-09 ENCOUNTER — Telehealth: Payer: Self-pay

## 2023-12-09 DIAGNOSIS — M1712 Unilateral primary osteoarthritis, left knee: Secondary | ICD-10-CM | POA: Diagnosis not present

## 2023-12-09 DIAGNOSIS — S8392XA Sprain of unspecified site of left knee, initial encounter: Secondary | ICD-10-CM | POA: Diagnosis not present

## 2023-12-09 NOTE — Telephone Encounter (Signed)
 Received faxed surgical clearance form from [facility]. Pt scheduled for L TKA on 02/16/2024.   Lvm asking pt to call back. Needs to schedule OV for pre-op eval.  [Placed form in basket on Lisa's desk.]

## 2023-12-10 NOTE — Telephone Encounter (Signed)
 Copied from CRM [id number]. Topic: Appointments - Scheduling Inquiry for Clinic >> Dec 09, 2023  3:57 PM Kevin Sheppard wrote: Reason for CRM: Patient called in regarding a missed called from [facility], informing him he needs to schedule and office visit for pre-op eval . Patient stated he feel like he doesn't need one because he just saw Dr.G this week and talk about the surgery, he would like to know though if he has to schedule an office visit with Dr.G can he also get blood work for his surgery as well done here

## 2023-12-11 NOTE — Telephone Encounter (Signed)
 Lvm asking pt to call back. Pt needs pre-op OV with Dr Reece Agar so we can do all the tests they want done before he can have the scheduled surgery.

## 2023-12-14 NOTE — Telephone Encounter (Addendum)
 Lvm asking pt to call back. Pt needs pre-op OV with Dr Reece Agar so we can do all the tests they want done before he can have the scheduled surgery.   Also, sending MyChart message.

## 2023-12-15 NOTE — Telephone Encounter (Signed)
 Looks like pt scheduled OV on 01/20/24 at 4:00.

## 2024-01-20 ENCOUNTER — Encounter: Payer: Self-pay | Admitting: Family Medicine

## 2024-01-20 ENCOUNTER — Ambulatory Visit: Admitting: Family Medicine

## 2024-01-20 ENCOUNTER — Ambulatory Visit (INDEPENDENT_AMBULATORY_CARE_PROVIDER_SITE_OTHER)
Admission: RE | Admit: 2024-01-20 | Discharge: 2024-01-20 | Disposition: A | Discharge status: Home / Self Care | Source: Ambulatory Visit | Attending: Family Medicine | Admitting: Family Medicine

## 2024-01-20 VITALS — BP 120/68 | HR 65 | Temp 97.9°F | Ht 64.0 in | Wt 158.5 lb

## 2024-01-20 DIAGNOSIS — R21 Rash and other nonspecific skin eruption: Secondary | ICD-10-CM | POA: Diagnosis not present

## 2024-01-20 DIAGNOSIS — G6289 Other specified polyneuropathies: Secondary | ICD-10-CM

## 2024-01-20 DIAGNOSIS — E039 Hypothyroidism, unspecified: Secondary | ICD-10-CM

## 2024-01-20 DIAGNOSIS — Z01818 Encounter for other preprocedural examination: Secondary | ICD-10-CM

## 2024-01-20 DIAGNOSIS — Z7984 Long term (current) use of oral hypoglycemic drugs: Secondary | ICD-10-CM | POA: Diagnosis not present

## 2024-01-20 DIAGNOSIS — Z7985 Long-term (current) use of injectable non-insulin antidiabetic drugs: Secondary | ICD-10-CM | POA: Diagnosis not present

## 2024-01-20 DIAGNOSIS — R931 Abnormal findings on diagnostic imaging of heart and coronary circulation: Secondary | ICD-10-CM

## 2024-01-20 DIAGNOSIS — I444 Left anterior fascicular block: Secondary | ICD-10-CM | POA: Diagnosis not present

## 2024-01-20 DIAGNOSIS — M898X8 Other specified disorders of bone, other site: Secondary | ICD-10-CM | POA: Diagnosis not present

## 2024-01-20 DIAGNOSIS — M25569 Pain in unspecified knee: Secondary | ICD-10-CM | POA: Diagnosis not present

## 2024-01-20 DIAGNOSIS — E1169 Type 2 diabetes mellitus with other specified complication: Secondary | ICD-10-CM | POA: Diagnosis not present

## 2024-01-20 MED ORDER — TRIAMCINOLONE ACETONIDE 0.1 % EX CREA: 1.0000 | TOPICAL_CREAM | Freq: Two times a day (BID) | CUTANEOUS | 1 refills | Status: AC | PRN

## 2024-01-20 NOTE — Assessment & Plan Note (Addendum)
 Chronic, latest A1c 7.2% (12/07/2023). Good control at home.  Update fructosamine today. Continue glipizide  XL, metformin  1000mg  bid, ozempic . Rec hold Ozempic  1wk prior to surgery.

## 2024-01-20 NOTE — Assessment & Plan Note (Addendum)
 RCRI = 0 Update labs ,CXR, EKG.  Denies cardiac symptoms at this time.  Anticipate adequately low risk to proceed with planned hip replacement surgery.  Last A1c 7.2%. Update fructosamine level.  Will recommend holding Ozempic  for 1 wk prior to planned surgery.  On aspirin  81mg  daily for CAD - ok to hold aspirin  7d prior to surgery with recommencement as soon as approved by orthopedics.

## 2024-01-20 NOTE — Assessment & Plan Note (Signed)
 Continue gabapentin  100-200mg  nightly.

## 2024-01-20 NOTE — Progress Notes (Addendum)
 Ph: [phone] Fax: [phone]   Patient ID: Kevin Sheppard., male    DOB: 09/13/1951, 73 y.o.   MRN: [id number]  This visit was conducted in person.  BP 120/68   Pulse 65   Temp 97.9 F (36.6 C) (Oral)   Ht 5\' 4"  (1.626 m)   Wt 158 lb 8 oz (71.9 kg)   SpO2 94%   BMI 27.21 kg/m    CC: pre-op eval  Subjective:   HPI: Kevin Sheppard. is a 73 y.o. male presenting on 01/20/2024 for Pre-op Exam (Pt scheduled for L TKA on 02/16/2024.)   [place].  has a past medical history of COVID-19 virus infection (09/2019), Diabetes type 2, controlled (HCC), Erectile dysfunction, GERD (gastroesophageal reflux disease), History of chicken pox, History of colon polyps, History of kidney stones, HLD (hyperlipidemia), Hypothyroid, Left anterior fascicular block, Osteoarthritis of right knee, Peripheral neuropathy (12/07/2013), and Undescended left testicle (1961).  Planned upcoming L total knee replacement by Dr Krasinski on 02/15/2024 for tricompartmental severe left knee osteoarthritis.   Patient has tolerated anesthesia well in the past.  Latest surgical intervention was R TKA 11/2022 followed by closed manipulation of that knee on 12/2022 Alan All).  Denies trouble with post-op nausea/vomiting, or trouble awakening after surgery.   Denies chest pain, dyspnea, palpitations, leg swelling, HA, dizziness.  No fevers/chills, coughing, abd pain, diarrhea, or UTI symptoms.  DM - on glipizide  XL 5mg  daily with breakfast, metformin  1000mg  bid, ozempic  2mg  weekly. Sugars running 120s.   Found to have elevated coronary calcium  score 04/2023 with Agatston score 1061 (86%) significant calcium  buildup in LAD, LCx, RCA. Echo was overall reassuring with EF 50-55% (07/2023). Known LAFB. Planned continue aspirin , atorvastatin . No further intervention required given pt asxs from cardiac standpoint.   Revised cardiac risk index score = 0  Planned high risk surgery (intraperitoneal, intrathoracic, or  suprainguinal vascular): No History of ischemic heart disease: No History of congestive heart failure: No History of cerebrovascular disease: No History of preoperative insulin  treatment: No History of kidney disease with creatinine >2 mg/dL: No       Relevant past medical, surgical, family and social history reviewed and updated as indicated. Interim medical history since our last visit reviewed. Allergies and medications reviewed and updated. Outpatient Medications Prior to Visit  Medication Sig Dispense Refill   aspirin  EC 81 MG tablet Take 1 tablet (81 mg total) by mouth daily. Swallow whole.     atorvastatin  (LIPITOR) 40 MG tablet Take 1 tablet (40 mg total) by mouth daily. 90 tablet 0   Blood Glucose Monitoring Suppl (FREESTYLE LITE) w/Device KIT Use to check sugars daily and as needed E11.69 1 kit 0   cetirizine  (ZYRTEC ) 10 MG tablet Take 1 tablet (10 mg total) by mouth daily.     gabapentin  (NEURONTIN ) 100 MG capsule Take 1-2 capsules (100-200 mg total) by mouth at bedtime. 60 capsule 3   glipiZIDE  (GLUCOTROL  XL) 5 MG 24 hr tablet Take 1 tablet (5 mg total) by mouth daily with breakfast. 90 tablet 4   glucose blood (FREESTYLE LITE) test strip Use as instructed to check blood sugar 3 times a day 300 each 3   levothyroxine  (SYNTHROID ) 88 MCG tablet Take 1 tablet (88 mcg total) by mouth daily with breakfast. 90 tablet 4   metFORMIN  (GLUCOPHAGE ) 1000 MG tablet Take 1 tablet (1,000 mg total) by mouth 2 (two) times daily with a meal. 180 tablet 4   Multiple Vitamins-Minerals (MULTIVITAMIN ADULTS 50+ PO)  Take 1 tablet by mouth daily.     Omega-3 Fatty Acids (FISH OIL PO) Take 1 tablet by mouth daily.     Semaglutide , 2 MG/DOSE, 8 MG/3ML SOPN Inject 2 mg as directed once a week. 9 mL 3   tadalafil  (CIALIS ) 10 MG tablet TAKE 1 TABLET BY MOUTH EVERY OTHER DAY AS NEEDED FOR ERECTILE DYSFUNCTION 10 tablet 6   zinc gluconate 50 MG tablet Take 50 mg by mouth daily.     zolpidem  (AMBIEN ) 10 MG  tablet TAKE 1 TABLET(10 MG) BY MOUTH AT BEDTIME AS NEEDED FOR SLEEP 20 tablet 0   triamcinolone  cream (KENALOG ) 0.1 % Apply 1 Application topically 2 (two) times daily as needed. 30 g 1   No facility-administered medications prior to visit.     Per HPI unless specifically indicated in ROS section below Review of Systems  Objective:  BP 120/68   Pulse 65   Temp 97.9 F (36.6 C) (Oral)   Ht 5\' 4"  (1.626 m)   Wt 158 lb 8 oz (71.9 kg)   SpO2 94%   BMI 27.21 kg/m   Wt Readings from Last 3 Encounters:  01/20/24 158 lb 8 oz (71.9 kg)  12/07/23 157 lb 4 oz (71.3 kg)  11/16/23 163 lb 3.2 oz (74 kg)      Physical Exam Vitals and nursing note reviewed.  Constitutional:      Appearance: Normal appearance. He is not ill-appearing.  HENT:     Mouth/Throat:     Mouth: Mucous membranes are moist.     Pharynx: Oropharynx is clear. No oropharyngeal exudate or posterior oropharyngeal erythema.  Eyes:     Extraocular Movements: Extraocular movements intact.     Conjunctiva/sclera: Conjunctivae normal.     Pupils: Pupils are equal, round, and reactive to light.  Cardiovascular:     Rate and Rhythm: Normal rate and regular rhythm.     Pulses: Normal pulses.     Heart sounds: Normal heart sounds. No murmur heard. Pulmonary:     Effort: Pulmonary effort is normal. No respiratory distress.     Breath sounds: Normal breath sounds. No wheezing, rhonchi or rales.  Musculoskeletal:     Right lower leg: No edema.     Left lower leg: No edema.  Skin:    General: Skin is warm and dry.     Findings: No rash.     Comments: No current rash  Neurological:     Mental Status: He is alert.  Psychiatric:        Mood and Affect: Mood normal.        Behavior: Behavior normal.       Results for orders placed or performed in visit on 12/07/23  HM DIABETES EYE EXAM   Collection Time: 11/18/23 12:00 AM  Result Value Ref Range   HM Diabetic Eye Exam No Retinopathy No Retinopathy   Lab Results   Component Value Date   NA 138 11/09/2023   CL 103 11/09/2023   K 3.9 11/09/2023   CO2 24 11/09/2023   BUN 24 (H) 11/09/2023   CREATININE 0.69 11/09/2023   GFR 93.14 11/09/2023   CALCIUM  10.0 11/09/2023   ALBUMIN 4.7 01/05/2023   GLUCOSE 151 (H) 11/09/2023    Lab Results  Component Value Date   HGBA1C 7.2 (A) 12/07/2023    Lab Results  Component Value Date   TSH 1.70 11/09/2023    EKG - NSR 60, LAD with LAFB, normal intervals, no hypertrophy or acute  ST/T changes, unchanged from prior  Assessment & Plan:   Problem List Items Addressed This Visit     Hypothyroidism   Latest TSH stable at 1.7 on current regimen of levothyroxine  88mcg daily.       Type 2 diabetes mellitus with other specified complication (HCC)   Chronic, latest A1c 7.2% (12/07/2023). Good control at home.  Update fructosamine today. Continue glipizide  XL, metformin  1000mg  bid, ozempic . Rec hold Ozempic  1wk prior to surgery.       Peripheral neuropathy   Continue gabapentin  100-200mg  nightly.       LAFB (left anterior fascicular block)   Chronic, stable, present since 2006.  Pt denies palpitations, dyspnea, chest discomfort.       Pre-op evaluation - Primary   RCRI = 0 Update labs ,CXR, EKG.  Denies cardiac symptoms at this time.  Anticipate adequately low risk to proceed with planned hip replacement surgery.  Last A1c 7.2%. Update fructosamine level.  Will recommend holding Ozempic  for 1 wk prior to planned surgery.  On aspirin  81mg  daily for CAD - ok to hold aspirin  7d prior to surgery with recommencement as soon as approved by orthopedics.       Relevant Orders   DG Chest 2 View   CBC with Differential/Platelet   Comprehensive metabolic panel with GFR   Fructosamine   EKG 12-Lead (Completed)   Agatston coronary artery calcium  score greater than 400   Established with cardiology. Pt asxs from cardiac standpoint.       Rash   Triamcinolone  cream refilled today per pt request.   Discussed use PRN bug bites ,poison ivy, not more than 1 wk at a time.       Relevant Medications   triamcinolone  cream (KENALOG ) 0.1 %     Meds ordered this encounter  Medications   triamcinolone  cream (KENALOG ) 0.1 %    Sig: Apply 1 Application topically 2 (two) times daily as needed.    Dispense:  30 g    Refill:  1    Orders Placed This Encounter  Procedures   DG Chest 2 View    Standing Status:   Future    Number of Occurrences:   1    Expiration Date:   01/19/2025    Reason for Exam (SYMPTOM  OR DIAGNOSIS REQUIRED):   preop eval    Preferred imaging location?:   [facility]   CBC with Differential/Platelet   Comprehensive metabolic panel with GFR   Fructosamine   EKG 12-Lead    Patient Instructions  Update labs, chest xray and EKG today  We will forward results to Dr Krasinksi.   Follow up plan: No follow-ups on file.  Claire Crick, MD

## 2024-01-20 NOTE — Patient Instructions (Addendum)
 Update labs, chest xray and EKG today  We will forward results to Dr Gerold Kos.

## 2024-01-20 NOTE — Assessment & Plan Note (Addendum)
 Triamcinolone  cream refilled today per pt request.  Discussed use PRN bug bites ,poison ivy, not more than 1 wk at a time.

## 2024-01-20 NOTE — Assessment & Plan Note (Signed)
 Chronic, stable, present since 2006.  Pt denies palpitations, dyspnea, chest discomfort.

## 2024-01-20 NOTE — Assessment & Plan Note (Signed)
 Latest TSH stable at 1.7 on current regimen of levothyroxine  88mcg daily.

## 2024-01-20 NOTE — Assessment & Plan Note (Signed)
 Established with cardiology. Pt asxs from cardiac standpoint.

## 2024-01-21 ENCOUNTER — Encounter: Payer: Self-pay | Admitting: Family Medicine

## 2024-01-21 LAB — CBC WITH DIFFERENTIAL/PLATELET
Basophils Absolute: 0 10*3/uL (ref 0.0–0.1)
Basophils Relative: 0.8 % (ref 0.0–3.0)
Eosinophils Absolute: 0.1 10*3/uL (ref 0.0–0.7)
Eosinophils Relative: 2.3 % (ref 0.0–5.0)
HCT: 41.7 % (ref 39.0–52.0)
Hemoglobin: 14.4 g/dL (ref 13.0–17.0)
Lymphocytes Relative: 36.4 % (ref 12.0–46.0)
Lymphs Abs: 1.8 10*3/uL (ref 0.7–4.0)
MCHC: 34.6 g/dL (ref 30.0–36.0)
MCV: 98.2 fl (ref 78.0–100.0)
Monocytes Absolute: 0.5 10*3/uL (ref 0.1–1.0)
Monocytes Relative: 10.4 % (ref 3.0–12.0)
Neutro Abs: 2.5 10*3/uL (ref 1.4–7.7)
Neutrophils Relative %: 50.1 % (ref 43.0–77.0)
Platelets: 195 10*3/uL (ref 150.0–400.0)
RBC: 4.25 Mil/uL (ref 4.22–5.81)
RDW: 12.8 % (ref 11.5–15.5)
WBC: 5.1 10*3/uL (ref 4.0–10.5)

## 2024-01-21 LAB — COMPREHENSIVE METABOLIC PANEL WITH GFR
ALT: 18 U/L (ref 0–53)
AST: 17 U/L (ref 0–37)
Albumin: 4.4 g/dL (ref 3.5–5.2)
Alkaline Phosphatase: 80 U/L (ref 39–117)
BUN: 15 mg/dL (ref 6–23)
CO2: 29 meq/L (ref 19–32)
Calcium: 9.4 mg/dL (ref 8.4–10.5)
Chloride: 102 meq/L (ref 96–112)
Creatinine, Ser: 0.57 mg/dL (ref 0.40–1.50)
GFR: 98.54 mL/min (ref >60.00)
Glucose, Bld: 124 mg/dL — ABNORMAL HIGH (ref 70–99)
Potassium: 4.1 meq/L (ref 3.5–5.1)
Sodium: 138 meq/L (ref 135–145)
Total Bilirubin: 0.5 mg/dL (ref 0.2–1.2)
Total Protein: 6.5 g/dL (ref 6.0–8.3)

## 2024-01-22 LAB — FRUCTOSAMINE: Fructosamine: 279 umol/L (ref 205–285)

## 2024-01-25 NOTE — Telephone Encounter (Signed)
 Faxed surgical clearance form, OV notes, labs, EKG and CXR to [facility]) at [phone]; attn:  Linette Rias, MD.

## 2024-01-27 ENCOUNTER — Ambulatory Visit: Payer: Self-pay | Admitting: Family Medicine

## 2024-01-29 ENCOUNTER — Other Ambulatory Visit: Payer: Self-pay | Admitting: Family Medicine

## 2024-01-29 DIAGNOSIS — E1169 Type 2 diabetes mellitus with other specified complication: Secondary | ICD-10-CM

## 2024-02-03 ENCOUNTER — Other Ambulatory Visit: Payer: Self-pay | Admitting: Family Medicine

## 2024-02-03 DIAGNOSIS — E039 Hypothyroidism, unspecified: Secondary | ICD-10-CM

## 2024-02-10 ENCOUNTER — Other Ambulatory Visit: Payer: Self-pay | Admitting: Family Medicine

## 2024-02-10 DIAGNOSIS — E1169 Type 2 diabetes mellitus with other specified complication: Secondary | ICD-10-CM

## 2024-02-16 DIAGNOSIS — K219 Gastro-esophageal reflux disease without esophagitis: Secondary | ICD-10-CM | POA: Diagnosis not present

## 2024-02-16 DIAGNOSIS — E785 Hyperlipidemia, unspecified: Secondary | ICD-10-CM | POA: Diagnosis not present

## 2024-02-16 DIAGNOSIS — G8918 Other acute postprocedural pain: Secondary | ICD-10-CM | POA: Diagnosis not present

## 2024-02-16 DIAGNOSIS — Z7985 Long-term (current) use of injectable non-insulin antidiabetic drugs: Secondary | ICD-10-CM | POA: Diagnosis not present

## 2024-02-16 DIAGNOSIS — Z96652 Presence of left artificial knee joint: Secondary | ICD-10-CM | POA: Insufficient documentation

## 2024-02-16 DIAGNOSIS — M1712 Unilateral primary osteoarthritis, left knee: Secondary | ICD-10-CM | POA: Diagnosis not present

## 2024-02-16 DIAGNOSIS — E1142 Type 2 diabetes mellitus with diabetic polyneuropathy: Secondary | ICD-10-CM | POA: Diagnosis not present

## 2024-02-16 DIAGNOSIS — Z7984 Long term (current) use of oral hypoglycemic drugs: Secondary | ICD-10-CM | POA: Diagnosis not present

## 2024-02-16 DIAGNOSIS — E039 Hypothyroidism, unspecified: Secondary | ICD-10-CM | POA: Diagnosis not present

## 2024-02-16 DIAGNOSIS — M25762 Osteophyte, left knee: Secondary | ICD-10-CM | POA: Diagnosis not present

## 2024-02-16 DIAGNOSIS — Z96651 Presence of right artificial knee joint: Secondary | ICD-10-CM | POA: Diagnosis not present

## 2024-02-17 DIAGNOSIS — Z7985 Long-term (current) use of injectable non-insulin antidiabetic drugs: Secondary | ICD-10-CM | POA: Diagnosis not present

## 2024-02-17 DIAGNOSIS — E1142 Type 2 diabetes mellitus with diabetic polyneuropathy: Secondary | ICD-10-CM | POA: Diagnosis not present

## 2024-02-17 DIAGNOSIS — Z96651 Presence of right artificial knee joint: Secondary | ICD-10-CM | POA: Diagnosis not present

## 2024-02-17 DIAGNOSIS — Z7984 Long term (current) use of oral hypoglycemic drugs: Secondary | ICD-10-CM | POA: Diagnosis not present

## 2024-02-17 DIAGNOSIS — K219 Gastro-esophageal reflux disease without esophagitis: Secondary | ICD-10-CM | POA: Diagnosis not present

## 2024-02-17 DIAGNOSIS — M25762 Osteophyte, left knee: Secondary | ICD-10-CM | POA: Diagnosis not present

## 2024-02-17 DIAGNOSIS — E039 Hypothyroidism, unspecified: Secondary | ICD-10-CM | POA: Diagnosis not present

## 2024-02-17 DIAGNOSIS — E785 Hyperlipidemia, unspecified: Secondary | ICD-10-CM | POA: Diagnosis not present

## 2024-02-17 DIAGNOSIS — M1712 Unilateral primary osteoarthritis, left knee: Secondary | ICD-10-CM | POA: Diagnosis not present

## 2024-02-18 ENCOUNTER — Telehealth: Payer: Self-pay

## 2024-02-18 NOTE — Telephone Encounter (Signed)
 Copied from CRM [id number]. Topic: General - Other >> Feb 18, 2024  7:36 AM Dorisann Garre T wrote: Reason for CRM: patient is calling in regarding his appt for the 6th he would like a call back regarding this he stated that he will not be able to come in he had a knee replacement

## 2024-02-18 NOTE — Telephone Encounter (Signed)
 Spoke to health coach she will reach out to patient about appointment

## 2024-02-19 ENCOUNTER — Ambulatory Visit: Payer: Medicare PPO

## 2024-02-19 ENCOUNTER — Encounter: Payer: Medicare PPO | Admitting: Family Medicine

## 2024-02-22 DIAGNOSIS — M25562 Pain in left knee: Secondary | ICD-10-CM | POA: Diagnosis not present

## 2024-02-22 DIAGNOSIS — R600 Localized edema: Secondary | ICD-10-CM | POA: Diagnosis not present

## 2024-02-23 ENCOUNTER — Telehealth: Payer: Self-pay

## 2024-02-23 NOTE — Telephone Encounter (Signed)
 Copied from CRM [id number]. Topic: General - Other >> Feb 23, 2024  8:10 AM Trula Gable C wrote: Reason for CRM: Patient called in regarding a message he would like get to Dr.Gutierrez  Had a knee replacement surgery has been sticking to the diet , sugar is still high 232. Wanted to know if he needs to up the dosage or a new prescription , would like to know what he needs to do regarding this

## 2024-02-24 NOTE — Telephone Encounter (Addendum)
 Spoke with pt relaying Dr Ocie Belt message. Pt verbalizes understanding and confirms he is taking glipizide  XL 5 mg daily. States he will increase dose and will let us  know if it doesn't work.

## 2024-02-24 NOTE — Telephone Encounter (Addendum)
 He's on full dose metformin  and ozempic . Is he taking glipizide  XL 5mg  daily with breakfast? We could try increasing this to 10mg  daily with breakfast.  If this doesn't help control sugars, we will need to discuss additional medication

## 2024-02-26 DIAGNOSIS — R600 Localized edema: Secondary | ICD-10-CM | POA: Diagnosis not present

## 2024-02-26 DIAGNOSIS — M25562 Pain in left knee: Secondary | ICD-10-CM | POA: Diagnosis not present

## 2024-02-29 DIAGNOSIS — M25562 Pain in left knee: Secondary | ICD-10-CM | POA: Diagnosis not present

## 2024-02-29 DIAGNOSIS — R600 Localized edema: Secondary | ICD-10-CM | POA: Diagnosis not present

## 2024-03-03 DIAGNOSIS — Z96652 Presence of left artificial knee joint: Secondary | ICD-10-CM | POA: Diagnosis not present

## 2024-03-04 DIAGNOSIS — R600 Localized edema: Secondary | ICD-10-CM | POA: Diagnosis not present

## 2024-03-04 DIAGNOSIS — M25562 Pain in left knee: Secondary | ICD-10-CM | POA: Diagnosis not present

## 2024-03-07 DIAGNOSIS — R600 Localized edema: Secondary | ICD-10-CM | POA: Diagnosis not present

## 2024-03-07 DIAGNOSIS — M25562 Pain in left knee: Secondary | ICD-10-CM | POA: Diagnosis not present

## 2024-03-11 DIAGNOSIS — R600 Localized edema: Secondary | ICD-10-CM | POA: Diagnosis not present

## 2024-03-11 DIAGNOSIS — M25562 Pain in left knee: Secondary | ICD-10-CM | POA: Diagnosis not present

## 2024-03-14 DIAGNOSIS — R600 Localized edema: Secondary | ICD-10-CM | POA: Diagnosis not present

## 2024-03-14 DIAGNOSIS — M25562 Pain in left knee: Secondary | ICD-10-CM | POA: Diagnosis not present

## 2024-03-24 ENCOUNTER — Telehealth: Payer: Self-pay | Admitting: Family Medicine

## 2024-03-24 ENCOUNTER — Other Ambulatory Visit: Payer: Self-pay | Admitting: Family Medicine

## 2024-03-24 DIAGNOSIS — E1169 Type 2 diabetes mellitus with other specified complication: Secondary | ICD-10-CM

## 2024-03-24 MED ORDER — GLIPIZIDE ER 5 MG PO TB24: 5.0000 mg | ORAL_TABLET | Freq: Every day | ORAL | 0 refills | Status: DC

## 2024-03-24 NOTE — Telephone Encounter (Unsigned)
 Copied from CRM [id number]. Topic: Clinical - Medication Refill >> Mar 24, 2024 11:14 AM Mia F wrote: Medication: glipiZIDE   Has the patient contacted their pharmacy? No (Agent: If no, request that the patient contact the pharmacy for the refill. If patient does not wish to contact the pharmacy document the reason why and proceed with request.) (Agent: If yes, when and what did the pharmacy advise?)  This is the patient's preferred pharmacy:  [id number] [place], [place] - [address] AT [facility] OF [address] [place] [address] Phone: [phone] Fax: [phone]  [facility] - [name], [place] - [address] [place] [address] Phone: [phone] Fax: [phone]  [id number] [place], [place] - [address] [place] [address] Phone: [phone] Fax: [phone]  Is this the correct pharmacy for this prescription? Yes If no, delete pharmacy and type the correct one.   Has the prescription been filled recently? No  Is the patient out of the medication? No  Has the patient been seen for an appointment in the last year OR does the patient have an upcoming appointment? Yes  Can we respond through MyChart? Yes  Agent: Please be advised that Rx refills may take up to 3 business days. We ask that you follow-up with your pharmacy.

## 2024-03-24 NOTE — Telephone Encounter (Signed)
 Copied from CRM [id number]. Topic: Clinical - Medication Refill >> Mar 24, 2024 11:14 AM Mia F wrote: Medication: glipiZIDE   Has the patient contacted their pharmacy? No (Agent: If no, request that the patient contact the pharmacy for the refill. If patient does not wish to contact the pharmacy document the reason why and proceed with request.) (Agent: If yes, when and what did the pharmacy advise?)  This is the patient's preferred pharmacy:  [id number] [place], [place] - [address] AT [facility] OF [address] [place] [address] Phone: [phone] Fax: [phone]  [facility] - [name], [place] - [address] [place] [address] Phone: [phone] Fax: [phone]  [id number] [place], [place] - [address] [place] [place]  [place] [address] Phone: [phone] Fax: [phone]  Is this the correct pharmacy for this prescription? Yes If no, delete pharmacy and type the correct one.   Has the prescription been filled recently? No  Is the patient out of the medication? No  Has the patient been seen for an appointment in the last year OR does the patient have an upcoming appointment? Yes  Can we respond through MyChart? Yes  Agent: Please be advised that Rx refills may take up to 3 business days. We ask that you follow-up with your pharmacy. >> Mar 24, 2024  1:00 PM Mia F wrote: This is the pt preferred pharmacy. Forgot to delete the others.   [facility] - [place], [place] - [address] [place] [address] Phone: [phone] Fax: [phone]

## 2024-03-28 DIAGNOSIS — M25562 Pain in left knee: Secondary | ICD-10-CM | POA: Diagnosis not present

## 2024-03-28 DIAGNOSIS — R600 Localized edema: Secondary | ICD-10-CM | POA: Diagnosis not present

## 2024-04-08 DIAGNOSIS — M25562 Pain in left knee: Secondary | ICD-10-CM | POA: Diagnosis not present

## 2024-04-08 DIAGNOSIS — R600 Localized edema: Secondary | ICD-10-CM | POA: Diagnosis not present

## 2024-04-13 ENCOUNTER — Other Ambulatory Visit: Payer: Self-pay | Admitting: Family Medicine

## 2024-04-13 DIAGNOSIS — N529 Male erectile dysfunction, unspecified: Secondary | ICD-10-CM

## 2024-04-13 NOTE — Telephone Encounter (Signed)
 Name of Medication: cialis  10 mg  Name of Pharmacy: [facility]  Last Fill or Written Date and Quantity: #10 6 rf  Last Office Visit and Type: 01/20/24 pre op  Next Office Visit and Type: 05/23/24 f/u

## 2024-04-13 NOTE — Telephone Encounter (Deleted)
 Name of Medication:   tadalafil  (CIALIS ) 10 MG tablet   Name of Pharmacy:  Last Fill or Written Date and Quantity:  Last Office Visit and Type:  Next Office Visit and Type:  Last Controlled Substance Agreement Date:  Last UDS:

## 2024-04-15 DIAGNOSIS — R600 Localized edema: Secondary | ICD-10-CM | POA: Diagnosis not present

## 2024-04-15 DIAGNOSIS — M25562 Pain in left knee: Secondary | ICD-10-CM | POA: Diagnosis not present

## 2024-04-21 ENCOUNTER — Ambulatory Visit

## 2024-04-21 VITALS — BP 120/68 | Ht 64.0 in | Wt 143.0 lb

## 2024-04-21 DIAGNOSIS — Z0000 Encounter for general adult medical examination without abnormal findings: Secondary | ICD-10-CM | POA: Diagnosis not present

## 2024-04-21 DIAGNOSIS — Z2821 Immunization not carried out because of patient refusal: Secondary | ICD-10-CM | POA: Diagnosis not present

## 2024-04-21 NOTE — Patient Instructions (Signed)
 Mr. Marland , Thank you for taking time out of your busy schedule to complete your Annual Wellness Visit with me. I enjoyed our conversation and look forward to speaking with you again next year. I, as well as your care team,  appreciate your ongoing commitment to your health goals. Please review the following plan we discussed and let me know if I can assist you in the future. Your Game plan/ To Do List    Referrals: If you haven't heard from the office you've been referred to, please reach out to them at the phone provided.   Follow up Visits: We will see or speak with you next year for your Next Medicare AWV with our clinical staff Have you seen your provider in the last 6 months (3 months if uncontrolled diabetes)? Yes  Clinician Recommendations:  Aim for 30 minutes of exercise or brisk walking, 6-8 glasses of water , and 5 servings of fruits and vegetables each day.       This is a list of the screenings recommended for you:  Health Maintenance  Topic Date Due   Yearly kidney health urinalysis for diabetes  Never done   COVID-19 Vaccine (3 - 2024-25 season) 05/17/2023   Colon Cancer Screening  08/31/2023   Flu Shot  04/15/2024   Hemoglobin A1C  06/08/2024   Complete foot exam   09/10/2024   Eye exam for diabetics  11/17/2024   Yearly kidney function blood test for diabetes  01/19/2025   Medicare Annual Wellness Visit  04/21/2025   DTaP/Tdap/Td vaccine (3 - Td or Tdap) 05/14/2030   Pneumococcal Vaccine for age over 59  Completed   Hepatitis C Screening  Completed   Zoster (Shingles) Vaccine  Completed   Hepatitis B Vaccine  Aged Out   HPV Vaccine  Aged Out   Meningitis B Vaccine  Aged Out    Advanced directives: (Copy Requested) Please bring a copy of your health care power of attorney and living will to the office to be added to your chart at your convenience. You can mail to [facility] [address] [place], [place] [address] or email to  [email or web address] Advance Care Planning is important because it:  [x]  Makes sure you receive the medical care that is consistent with your values, goals, and preferences  [x]  It provides guidance to your family and loved ones and reduces their decisional burden about whether or not they are making the right decisions based on your wishes.  Follow the link provided in your after visit summary or read over the paperwork we have mailed to you to help you started getting your Advance Directives in place. If you need assistance in completing these, please reach out to us  so that we can help you!  See attachments for Preventive Care and Fall Prevention Tips.

## 2024-04-21 NOTE — Progress Notes (Signed)
 Because this visit was a virtual/telehealth visit,  certain criteria was not obtained, such a blood pressure, CBG if applicable, and timed get up and go. Any medications not marked as taking were not mentioned during the medication reconciliation part of the visit. Any vitals not documented were not able to be obtained due to this being a telehealth visit or patient was unable to self-report a recent blood pressure reading due to a lack of equipment at home via telehealth. Vitals that have been documented are verbally provided by the patient.   This visit was performed by a medical professional under my direct supervision. I was immediately available for consultation/collaboration. I have reviewed and agree with the Annual Wellness Visit documentation.  Subjective:   Kevin Mohabir. is a 73 y.o. who presents for a Medicare Wellness preventive visit.  As a reminder, Annual Wellness Visits don't include a physical exam, and some assessments may be limited, especially if this visit is performed virtually. We may recommend an in-person follow-up visit with your provider if needed.  Visit Complete: Virtual I connected with  Kevin Sheppard. on 04/21/24 by a audio enabled telemedicine application and verified that I am speaking with the correct person using two identifiers.  Patient Location: Home  Provider Location: Home Office  I discussed the limitations of evaluation and management by telemedicine. The patient expressed understanding and agreed to proceed.  Vital Signs: Because this visit was a virtual/telehealth visit, some criteria may be missing or patient reported. Any vitals not documented were not able to be obtained and vitals that have been documented are patient reported.  VideoDeclined- This patient declined Librarian, academic. Therefore the visit was completed with audio only.  Persons Participating in Visit: Patient.  AWV Questionnaire: No: Patient  Medicare AWV questionnaire was not completed prior to this visit.  Cardiac Risk Factors include: advanced age (>73men, >34 women);male gender;diabetes mellitus;dyslipidemia     Objective:    Today's Vitals   04/21/24 0905 04/21/24 0906  BP: 120/68   Weight: 143 lb (64.9 kg)   Height: 5' 4 (1.626 m)   PainSc:  6    Body mass index is 24.55 kg/m.     04/21/2024    9:14 AM 02/17/2023   11:13 AM 01/01/2023    7:30 AM 11/20/2022   10:15 AM 11/07/2022    2:36 PM 02/16/2020   10:16 AM 08/30/2018    8:08 AM  Advanced Directives  Does Patient Have a Medical Advance Directive? Yes Yes No Yes Yes No Yes   Type of Estate agent of [place];Living will Healthcare Power of [place];Living will  Healthcare Power of [place];Living will Living will  Living will  Does patient want to make changes to medical advance directive? No - Patient declined   No - Patient declined     Copy of Healthcare Power of Attorney in Chart? No - copy requested No - copy requested  No - copy requested     Would patient like information on creating a medical advance directive?   No - Patient declined   No - Patient declined      Data saved with a previous flowsheet row definition    Current Medications (verified) Outpatient Encounter Medications as of 04/21/2024  Medication Sig   aspirin  EC 81 MG tablet Take 1 tablet (81 mg total) by mouth daily. Swallow whole.   atorvastatin  (LIPITOR) 40 MG tablet Take 1 tablet (40 mg total) by mouth daily.   Blood  Glucose Monitoring Suppl (FREESTYLE LITE) w/Device KIT Use to check sugars daily and as needed E11.69   cetirizine  (ZYRTEC ) 10 MG tablet Take 1 tablet (10 mg total) by mouth daily.   gabapentin  (NEURONTIN ) 100 MG capsule Take 1-2 capsules (100-200 mg total) by mouth at bedtime.   glipiZIDE  (GLUCOTROL  XL) 5 MG 24 hr tablet Take 1 tablet (5 mg total) by mouth daily with breakfast.   glucose blood (FREESTYLE LITE) test strip Use as instructed to check blood  sugar 3 times a day   levothyroxine  (SYNTHROID ) 88 MCG tablet TAKE 1 TABLET DAILY WITH BREAKFAST (NOTE NEW DOSE)   metFORMIN  (GLUCOPHAGE ) 1000 MG tablet TAKE 1 TABLET TWICE A DAY WITH MEALS   Multiple Vitamins-Minerals (MULTIVITAMIN ADULTS 50+ PO) Take 1 tablet by mouth daily.   Omega-3 Fatty Acids (FISH OIL PO) Take 1 tablet by mouth daily.   Semaglutide , 2 MG/DOSE, 8 MG/3ML SOPN Inject 2 mg as directed once a week.   tadalafil  (CIALIS ) 10 MG tablet TAKE 1 TABLET BY MOUTH EVERY OTHER DAY AS NEEDED FOR ERECTILE DYSFUNCTION   triamcinolone  cream (KENALOG ) 0.1 % Apply 1 Application topically 2 (two) times daily as needed.   zinc gluconate 50 MG tablet Take 50 mg by mouth daily.   zolpidem  (AMBIEN ) 10 MG tablet TAKE 1 TABLET(10 MG) BY MOUTH AT BEDTIME AS NEEDED FOR SLEEP   No facility-administered encounter medications on file as of 04/21/2024.    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   Undescended left testicle 1961   after trauma with dodgeball   Past Surgical History:  Procedure Laterality Date   COLONOSCOPY  2004;2009   first with b9 polyps, then diverticulosis no polyps, rpt 62yrs ([facility])   COLONOSCOPY WITH PROPOFOL  N/A 08/30/2018   2 TA, 1 SSP, diverticulosis, rpt 5 yrs Renne, Darren, MD)   KNEE CLOSED REDUCTION Right 01/01/2023   CLOSED MANIPULATION KNEE Sibyl Drivers, MD)   MENISCUS REPAIR Left 2008   MENISCUS REPAIR Right 2002   POLYPECTOMY N/A 08/30/2018   Procedure:  POLYPECTOMY;  Surgeon: Jinny Carmine, MD;  Location: [facility];  Service: Endoscopy;  Laterality: N/A;   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   Procedure: TOTAL KNEE ARTHROPLASTY;  Surgeon: Marchia Drivers, MD;  Location: [facility];  Service: Orthopedics;  Laterality: Right;   Family History  Problem Relation Age of Onset   Other Mother        blood disease, died 20 (?porphyria)   Heart disease Father    Stroke Father    Diabetes Father    Hypertension Sister    Stroke Sister 61   Thyroid  disease Sister    Heart disease Brother    Heart disease Brother    Diabetes type II Brother    Diabetes type II Maternal Aunt    Cancer Neg Hx    CAD Neg Hx    Social History   Socioeconomic History   Marital status:  Married    Spouse name: Maurilio   Number of children: 4   Years of education: Not on file   Highest education level: 12th grade  Occupational History   Occupation: Advertising copywriter  Tobacco Use   Smoking status: Never   Smokeless tobacco: Never  Vaping Use   Vaping status: Never Used  Substance and Sexual Activity   Alcohol use: Not Currently    Comment: occassional   Drug use: No   Sexual activity: Yes  Other Topics Concern   Not on file  Social History Narrative   He lives with wife.  2 dogs. They have grown 4 children and 3 grandchildren.    Retired Dentist), E6.    He works on cigarette machines, 3rd shift   Activity: goes to gym regularly   Diet: good water , fruits/vegetables daily   Social Drivers of Corporate investment banker Strain: Low Risk  (04/21/2024)   Overall Financial Resource Strain (CARDIA)    Difficulty of Paying Living Expenses: Not hard at all  Food Insecurity: No Food Insecurity (04/21/2024)   Hunger Vital Sign    Worried About Running Out of Food in the Last Year: Never true    Ran Out of Food in the Last Year: Never true  Transportation Needs: No Transportation Needs (04/21/2024)   PRAPARE - Scientist, research (physical sciences) (Medical): No    Lack of Transportation (Non-Medical): No  Physical Activity: Sufficiently Active (04/21/2024)   Exercise Vital Sign    Days of Exercise per Week: 7 days    Minutes of Exercise per Session: 60 min  Stress: No Stress Concern Present (04/21/2024)   [employer] of Occupational Health - Occupational Stress Questionnaire    Feeling of Stress: Not at all  Social Connections: Moderately Isolated (04/21/2024)   Social Connection and Isolation Panel    Frequency of Communication with Friends and Family: Once a week    Frequency of Social Gatherings with Friends and Family: Once a week    Attends Religious Services: Never    Database administrator or Organizations: No    Attends Engineer, structural: More than 4 times per year    Marital Status: Married    Tobacco Counseling Counseling given: Not Answered    Clinical Intake:  Pre-visit preparation completed: Yes  Pain : 0-10 Pain Score: 6  Pain Type: Chronic pain Pain Location: Knee Pain Orientation: Left Pain Descriptors / Indicators: Discomfort Pain Onset: Today Pain Frequency: Once a week     BMI - recorded: 24.55 Nutritional Status: BMI of 19-24  Normal Nutritional Risks: None Diabetes: Yes CBG done?: No Did pt. bring in CBG monitor from home?: No  Lab Results  Component Value Date   HGBA1C 7.2 (A) 12/07/2023   HGBA1C 8.4 (A) 09/11/2023   HGBA1C 7.6 (A) 04/14/2023     How often do you need to have someone help you when you read instructions, pamphlets, or other written materials from your doctor or pharmacy?: 1 - Never  Interpreter Needed?: No  Information entered by :: Mckaela Howley,CMA   Activities of Daily Living     04/21/2024    9:09 AM  In your present state of health, do you have any difficulty performing the following activities:  Hearing? 0  Vision? 0  Difficulty concentrating or making decisions? 0  Walking or climbing stairs? 0  Dressing or bathing? 0   Doing errands, shopping? 0  Preparing Food and eating ?  N  Using the Toilet? N  In the past six months, have you accidently leaked urine? N  Do you have problems with loss of bowel control? N  Managing your Medications? N  Managing your Finances? N  Housekeeping or managing your Housekeeping? N    Patient Care Team: Rilla Baller, MD as PCP - General (Family Medicine) Darliss Rogue, MD as PCP - Cardiology (Cardiology) [facility] (Orthopedic Surgery) [facility] Mancil Boas, DDS (Dentistry)  I have updated your Care Teams any recent Medical Services you may have received from other providers in the past year.     Assessment:   This is a routine wellness examination for Kevin Sheppard.  Hearing/Vision screen Hearing Screening - Comments:: No difficulties Vision Screening - Comments:: Patient wears    Goals Addressed             This Visit's Progress    Patient Stated       To control sugar        Depression Screen     04/21/2024    9:14 AM 12/07/2023    3:57 PM 11/09/2023    2:07 PM 09/11/2023    7:37 AM 04/14/2023    3:50 PM 02/17/2023    9:28 AM 01/12/2023    4:04 PM  PHQ 2/9 Scores  PHQ - 2 Score 0 0 0 0  0 0  PHQ- 9 Score 0  2      Exception Documentation     Patient refusal      Fall Risk     04/21/2024    9:14 AM 11/09/2023    2:07 PM 09/11/2023    7:37 AM 02/17/2023   11:13 AM 01/12/2023    4:03 PM  Fall Risk   Falls in the past year? 1 1 1  0 1  Number falls in past yr: 0 1 1 0 1  Injury with Fall? 0 0 0 0 0  Risk for fall due to : No Fall Risks   No Fall Risks   Follow up Falls evaluation completed   Falls prevention discussed;Falls evaluation completed     MEDICARE RISK AT HOME:  Medicare Risk at Home Any stairs in or around the home?: No If so, are there any without handrails?: No Home free of loose throw rugs in walkways, pet beds, electrical cords, etc?: Yes Adequate lighting in your home to reduce risk of falls?: Yes Life  alert?: No Use of a cane, walker or w/c?: No Grab bars in the bathroom?: Yes Shower chair or bench in shower?: Yes Elevated toilet seat or a handicapped toilet?: Yes  TIMED UP AND GO:  Was the test performed?  No  Cognitive Function: 6CIT completed        04/21/2024    9:08 AM 02/17/2023   11:10 AM  6CIT Screen  What Year? 0 points 0 points  What month? 0 points 0 points  What time? 0 points 0 points  Count back from 20 0 points 0 points  Months in reverse 0 points 0 points  Repeat phrase 0 points 0 points  Total Score 0 points 0 points    Immunizations Immunization History  Administered Date(s) Administered   Fluad Quad(high Dose 65+) 06/10/2023   Hep B, Unspecified 06/08/1996, 12/14/1996   Influenza Whole 06/14/2016   Influenza, High Dose Seasonal PF 07/19/2018, 06/15/2021   Influenza,inj,Quad PF,6+ Mos 06/15/2020   Influenza-Unspecified 06/12/2017, 06/14/2019   Janssen (J&J) SARS-COV-2 Vaccination 12/23/2019, 07/13/2020   Pneumococcal Conjugate-13 06/07/2014  Pneumococcal Polysaccharide-23 08/09/2018, 08/02/2019   Tdap 05/27/2010, 05/14/2020   Zoster Recombinant(Shingrix) 04/29/2018, 07/10/2018    Screening Tests Health Maintenance  Topic Date Due   Diabetic kidney evaluation - Urine ACR  Never done   COVID-19 Vaccine (3 - 2024-25 season) 05/17/2023   Colonoscopy  08/31/2023   INFLUENZA VACCINE  04/15/2024   HEMOGLOBIN A1C  06/08/2024   FOOT EXAM  09/10/2024   OPHTHALMOLOGY EXAM  11/17/2024   Diabetic kidney evaluation - eGFR measurement  01/19/2025   Medicare Annual Wellness (AWV)  04/21/2025   DTaP/Tdap/Td (3 - Td or Tdap) 05/14/2030   Pneumococcal Vaccine: 50+ Years  Completed   Hepatitis C Screening  Completed   Zoster Vaccines- Shingrix  Completed   Hepatitis B Vaccines  Aged Out   HPV VACCINES  Aged Out   Meningococcal B Vaccine  Aged Out    Health Maintenance  Health Maintenance Due  Topic Date Due   Diabetic kidney evaluation - Urine ACR   Never done   COVID-19 Vaccine (3 - 2024-25 season) 05/17/2023   Colonoscopy  08/31/2023   INFLUENZA VACCINE  04/15/2024   Health Maintenance Items Addressed:patient declined health maintenance  Additional Screening:  Vision Screening: Recommended annual ophthalmology exams for early detection of glaucoma and other disorders of the eye. Would you like a referral to an eye doctor? No    Dental Screening: Recommended annual dental exams for proper oral hygiene  Community Resource Referral / Chronic Care Management: CRR required this visit?  No   CCM required this visit?  No   Plan:    I have personally reviewed and noted the following in the patient's chart:   Medical and social history Use of alcohol, tobacco or illicit drugs  Current medications and supplements including opioid prescriptions. Patient is not currently taking opioid prescriptions. Functional ability and status Nutritional status Physical activity Advanced directives List of other physicians Hospitalizations, surgeries, and ER visits in previous 12 months Vitals Screenings to include cognitive, depression, and falls Referrals and appointments  In addition, I have reviewed and discussed with patient certain preventive protocols, quality metrics, and best practice recommendations. A written personalized care plan for preventive services as well as general preventive health recommendations were provided to patient.   Lyle MARLA Right, [place]   04/21/2024   After Visit Summary: (MyChart) Due to this being a telephonic visit, the after visit summary with patients personalized plan was offered to patient via MyChart   Notes: Nothing significant to report at this time.

## 2024-04-22 DIAGNOSIS — M25562 Pain in left knee: Secondary | ICD-10-CM | POA: Diagnosis not present

## 2024-04-22 DIAGNOSIS — R600 Localized edema: Secondary | ICD-10-CM | POA: Diagnosis not present

## 2024-05-06 DIAGNOSIS — M25562 Pain in left knee: Secondary | ICD-10-CM | POA: Diagnosis not present

## 2024-05-06 DIAGNOSIS — R600 Localized edema: Secondary | ICD-10-CM | POA: Diagnosis not present

## 2024-05-20 DIAGNOSIS — M25562 Pain in left knee: Secondary | ICD-10-CM | POA: Diagnosis not present

## 2024-05-20 DIAGNOSIS — R600 Localized edema: Secondary | ICD-10-CM | POA: Diagnosis not present

## 2024-05-23 ENCOUNTER — Ambulatory Visit (INDEPENDENT_AMBULATORY_CARE_PROVIDER_SITE_OTHER): Admitting: Family Medicine

## 2024-05-23 ENCOUNTER — Encounter: Payer: Self-pay | Admitting: Family Medicine

## 2024-05-23 VITALS — BP 122/80 | HR 58 | Temp 97.5°F | Ht 64.0 in | Wt 144.4 lb

## 2024-05-23 DIAGNOSIS — I700 Atherosclerosis of aorta: Secondary | ICD-10-CM | POA: Diagnosis not present

## 2024-05-23 DIAGNOSIS — G6289 Other specified polyneuropathies: Secondary | ICD-10-CM

## 2024-05-23 DIAGNOSIS — E1169 Type 2 diabetes mellitus with other specified complication: Secondary | ICD-10-CM | POA: Diagnosis not present

## 2024-05-23 DIAGNOSIS — Z7189 Other specified counseling: Secondary | ICD-10-CM

## 2024-05-23 DIAGNOSIS — Z96651 Presence of right artificial knee joint: Secondary | ICD-10-CM

## 2024-05-23 DIAGNOSIS — R931 Abnormal findings on diagnostic imaging of heart and coronary circulation: Secondary | ICD-10-CM

## 2024-05-23 DIAGNOSIS — E039 Hypothyroidism, unspecified: Secondary | ICD-10-CM | POA: Diagnosis not present

## 2024-05-23 DIAGNOSIS — Z860100 Personal history of colon polyps, unspecified: Secondary | ICD-10-CM

## 2024-05-23 DIAGNOSIS — Z0000 Encounter for general adult medical examination without abnormal findings: Secondary | ICD-10-CM | POA: Diagnosis not present

## 2024-05-23 DIAGNOSIS — Z125 Encounter for screening for malignant neoplasm of prostate: Secondary | ICD-10-CM

## 2024-05-23 DIAGNOSIS — Z8249 Family history of ischemic heart disease and other diseases of the circulatory system: Secondary | ICD-10-CM

## 2024-05-23 DIAGNOSIS — Z6824 Body mass index (BMI) 24.0-24.9, adult: Secondary | ICD-10-CM | POA: Diagnosis not present

## 2024-05-23 DIAGNOSIS — Z23 Encounter for immunization: Secondary | ICD-10-CM

## 2024-05-23 DIAGNOSIS — E785 Hyperlipidemia, unspecified: Secondary | ICD-10-CM

## 2024-05-23 DIAGNOSIS — Z1211 Encounter for screening for malignant neoplasm of colon: Secondary | ICD-10-CM

## 2024-05-23 DIAGNOSIS — Z96652 Presence of left artificial knee joint: Secondary | ICD-10-CM

## 2024-05-23 LAB — MICROALBUMIN / CREATININE URINE RATIO
Creatinine,U: 123.9 mg/dL
Microalb Creat Ratio: 7.8 mg/g (ref 0.0–30.0)
Microalb, Ur: 1 mg/dL (ref 0.0–1.9)

## 2024-05-23 MED ORDER — LEVOTHYROXINE SODIUM 88 MCG PO TABS: 88.0000 ug | ORAL_TABLET | Freq: Every day | ORAL | 3 refills | Status: AC

## 2024-05-23 MED ORDER — METFORMIN HCL 1000 MG PO TABS: 1000.0000 mg | ORAL_TABLET | Freq: Two times a day (BID) | ORAL | 3 refills | Status: AC

## 2024-05-23 MED ORDER — GLIPIZIDE ER 5 MG PO TB24: 5.0000 mg | ORAL_TABLET | Freq: Every day | ORAL | 3 refills | Status: AC

## 2024-05-23 NOTE — Assessment & Plan Note (Signed)
 Continue levothyroxine  Update TSH

## 2024-05-23 NOTE — Assessment & Plan Note (Signed)
 Established with cardiology. Remains asxs from cardiac standpoint. Continue statin, aspirin .  Goal LDL <55

## 2024-05-23 NOTE — Assessment & Plan Note (Addendum)
 Chronic, latest fructosamine eqiuvalent to A1c of 6.8%.  Continue current medications. Reviewed importance of strength training to maintain muscle mass with ozempic  use.  Significant weight loss noted - he attributes to healthy changes to date. Will update labs and discussed possibly dropping ozempic  dose pending updated A1c.

## 2024-05-23 NOTE — Progress Notes (Unsigned)
 Ph: [phone] Fax: [phone]   Patient ID: Kevin Vester., male    DOB: [birth year], 72 y.o.   MRN: [id number]  This visit was conducted in person.  BP 122/80   Pulse (!) 58   Temp (!) 97.5 F (36.4 C) (Oral)   Ht 5' 4 (1.626 m)   Wt 144 lb 6.4 oz (65.5 kg)   SpO2 99%   BMI 24.79 kg/m    CC: CPE Subjective:   HPI: Kevin Grivas. is a 73 y.o. male presenting on 05/23/2024 for Annual Exam   Not fasting today.  Saw health advisor last month for medicare wellness visit. Note reviewed.    No results found.  Flowsheet Row Office Visit from 05/23/2024 in [facility] [place]  PHQ-2 Total Score 0       05/23/2024   11:58 AM 04/21/2024    9:14 AM 11/09/2023    2:07 PM 09/11/2023    7:37 AM 02/17/2023   11:13 AM  Fall Risk   Falls in the past year? 0 1 1 1  0  Number falls in past yr: 0 0 1 1 0  Injury with Fall? 0 0 0 0 0  Risk for fall due to : No Fall Risks No Fall Risks   No Fall Risks  Follow up Falls evaluation completed Falls evaluation completed   Falls prevention discussed;Falls evaluation completed  Fall occurred without injury.   15 lb weight loss over 3 months - attributes to healthy diet and lifestyle choices. Working on high protein low calorie diet.   S/p L TKA 02/16/2024 complicated by L knee suture abscess treated with I&D and bactrim abx. Continues PT. He stays very active.   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   DM - continues metformin  1000mg  bid, glipizide  XL 5mg  once daily, ozempic  2mg  weekly.  Lab Results  Component Value Date   HGBA1C 7.2 (A) 12/07/2023   2 brothers have had CAD - one had bypass. Father with MI age 39yo.  Cardiac CT with calcium  score - 1061 (86%). Atorvastatin  was increased to 40mg  dialy, aspirin  81mg  daily started. Established with cardiology. Echocardiogram reassuring, mild MR. New goal LDL <55.    Preventative: COLONOSCOPY WITH PROPOFOL  08/30/2018 - 2 TA, 1 SSP, diverticulosis, rpt 5 yrs Renne, Darren, MD)  Prostate cancer screening - yearly screen  Lung cancer screening - not eligible  Flu shot yearly  COVID vaccine J&J 12/2019, booster 06/2020 Tdap 2011, 04/2020 Prevnar-13 05/2014, pneumovax 2019, 2020 (extra dose repeated at pharmacy) , prevnar-20 today  Shingrix - 04/2018, 06/2018 Advanced directive - Discussed - has at home but needs updating. Wants wife to be HCPOA. Asked to bring me copy.  Seat belt use discussed Sunscreen use discussed. No changing moles.  Non smoker - h/o second hand smoke exposure but not anymore. Works in [employer].  Alcohol - rarely Dentist q6 mo  Eye exam yearly  He lives with wife. 2 dogs. 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 Interim medical history since our last visit reviewed. Allergies and medications reviewed and updated. Outpatient Medications Prior to Visit  Medication Sig Dispense Refill  .  aspirin  EC 81 MG tablet Take 1 tablet (81 mg total) by mouth daily. Swallow whole.    . atorvastatin  (LIPITOR) 40 MG tablet Take 1 tablet (40 mg total) by mouth daily. 90 tablet 0  . Blood Glucose Monitoring Suppl (FREESTYLE LITE) w/Device KIT Use to check sugars daily and as needed E11.69 1 kit 0  . cetirizine  (ZYRTEC ) 10 MG tablet Take 1 tablet (10 mg total) by mouth daily.    . gabapentin  (NEURONTIN ) 100 MG capsule Take 1-2 capsules (100-200 mg total) by mouth at bedtime. 60 capsule 3  . glucose blood (FREESTYLE LITE) test strip Use as instructed to check blood sugar 3 times a day 300 each 3  . Multiple Vitamins-Minerals (MULTIVITAMIN ADULTS 50+ PO) Take 1 tablet by mouth daily.    .  Omega-3 Fatty Acids (FISH OIL PO) Take 1 tablet by mouth daily.    . Semaglutide , 2 MG/DOSE, 8 MG/3ML SOPN Inject 2 mg as directed once a week. 9 mL 3  . tadalafil  (CIALIS ) 10 MG tablet TAKE 1 TABLET BY MOUTH EVERY OTHER DAY AS NEEDED FOR ERECTILE DYSFUNCTION 10 tablet 6  . triamcinolone  cream (KENALOG ) 0.1 % Apply 1 Application topically 2 (two) times daily as needed. 30 g 1  . zinc gluconate 50 MG tablet Take 50 mg by mouth daily.    . zolpidem  (AMBIEN ) 10 MG tablet TAKE 1 TABLET(10 MG) BY MOUTH AT BEDTIME AS NEEDED FOR SLEEP 20 tablet 0  . glipiZIDE  (GLUCOTROL  XL) 5 MG 24 hr tablet Take 1 tablet (5 mg total) by mouth daily with breakfast. 90 tablet 0  . levothyroxine  (SYNTHROID ) 88 MCG tablet TAKE 1 TABLET DAILY WITH BREAKFAST (NOTE NEW DOSE) 90 tablet 1  . metFORMIN  (GLUCOPHAGE ) 1000 MG tablet TAKE 1 TABLET TWICE A DAY WITH MEALS 180 tablet 1  . traMADol  (ULTRAM ) 50 MG tablet Take 50 mg by mouth every 6 (six) hours as needed. for pain     No facility-administered medications prior to visit.     Per HPI unless specifically indicated in ROS section below Review of Systems  Constitutional:  Negative for activity change, appetite change, chills, fatigue, fever and unexpected weight change.  HENT:  Negative for hearing loss.   Eyes:  Negative for visual disturbance.  Respiratory:  Negative for cough, chest tightness, shortness of breath and wheezing.   Cardiovascular:  Negative for chest pain, palpitations and leg swelling.  Gastrointestinal:  Negative for abdominal distention, abdominal pain, blood in stool, constipation, diarrhea, nausea and vomiting.  Genitourinary:  Negative for difficulty urinating and hematuria.  Musculoskeletal:  Negative for arthralgias, myalgias and neck pain.  Skin:  Negative for rash.  Neurological:  Negative for dizziness, seizures, syncope and headaches.  Hematological:  Negative for adenopathy. Does not bruise/bleed easily.  Psychiatric/Behavioral:  Negative  for dysphoric mood. The patient is not nervous/anxious.     Objective:  BP 122/80   Pulse (!) 58   Temp (!) 97.5 F (36.4 C) (Oral)   Ht 5' 4 (1.626 m)   Wt 144 lb 6.4 oz (65.5 kg)   SpO2 99%   BMI 24.79 kg/m   Wt Readings from Last 3 Encounters:  05/23/24 144 lb 6.4 oz (65.5 kg)  04/21/24 143 lb (64.9 kg)  01/20/24 158 lb 8 oz (71.9 kg)      Physical Exam Vitals and nursing note reviewed.  Constitutional:      General: He is not in acute distress.    Appearance: Normal appearance. He is  well-developed. He is not ill-appearing.  HENT:     Head: Normocephalic and atraumatic.     Right Ear: Hearing, tympanic membrane, ear canal and external ear normal.     Left Ear: Hearing, tympanic membrane, ear canal and external ear normal.     Mouth/Throat:     Mouth: Mucous membranes are moist.     Pharynx: Oropharynx is clear. No oropharyngeal exudate or posterior oropharyngeal erythema.  Eyes:     General: No scleral icterus.    Extraocular Movements: Extraocular movements intact.     Conjunctiva/sclera: Conjunctivae normal.     Pupils: Pupils are equal, round, and reactive to light.  Neck:     Thyroid : No thyroid  mass or thyromegaly.     Vascular: No carotid bruit.  Cardiovascular:     Rate and Rhythm: Normal rate and regular rhythm.     Pulses: Normal pulses.          Radial pulses are 2+ on the right side and 2+ on the left side.     Heart sounds: Normal heart sounds. No murmur heard. Pulmonary:     Effort: Pulmonary effort is normal. No respiratory distress.     Breath sounds: Normal breath sounds. No wheezing, rhonchi or rales.  Abdominal:     General: Bowel sounds are normal. There is no distension.     Palpations: Abdomen is soft. There is no mass.     Tenderness: There is no abdominal tenderness. There is no guarding or rebound.     Hernia: No hernia is present.  Musculoskeletal:        General: Normal range of motion.     Cervical back: Normal range of motion and  neck supple.     Right lower leg: No edema.     Left lower leg: No edema.  Lymphadenopathy:     Cervical: No cervical adenopathy.  Skin:    General: Skin is warm and dry.     Findings: No rash.  Neurological:     General: No focal deficit present.     Mental Status: He is alert and oriented to person, place, and time.  Psychiatric:        Mood and Affect: Mood normal.        Behavior: Behavior normal.        Thought Content: Thought content normal.        Judgment: Judgment normal.       Results for orders placed or performed in visit on 01/20/24  CBC with Differential/Platelet   Collection Time: 01/20/24  4:31 PM  Result Value Ref Range   WBC 5.1 4.0 - 10.5 K/uL   RBC 4.25 4.22 - 5.81 Mil/uL   Hemoglobin 14.4 13.0 - 17.0 g/dL   HCT 58.2 60.9 - 47.9 %   MCV 98.2 78.0 - 100.0 fl   MCHC 34.6 30.0 - 36.0 g/dL   RDW 87.1 88.4 - 84.4 %   Platelets 195.0 150.0 - 400.0 K/uL   Neutrophils Relative % 50.1 43.0 - 77.0 %   Lymphocytes Relative 36.4 12.0 - 46.0 %   Monocytes Relative 10.4 3.0 - 12.0 %   Eosinophils Relative 2.3 0.0 - 5.0 %   Basophils Relative 0.8 0.0 - 3.0 %   Neutro Abs 2.5 1.4 - 7.7 K/uL   Lymphs Abs 1.8 0.7 - 4.0 K/uL   Monocytes Absolute 0.5 0.1 - 1.0 K/uL   Eosinophils Absolute 0.1 0.0 - 0.7 K/uL   Basophils Absolute 0.0 0.0 -  0.1 K/uL  Comprehensive metabolic panel with GFR   Collection Time: 01/20/24  4:31 PM  Result Value Ref Range   Sodium 138 135 - 145 mEq/L   Potassium 4.1 3.5 - 5.1 mEq/L   Chloride 102 96 - 112 mEq/L   CO2 29 19 - 32 mEq/L   Glucose, Bld 124 (H) 70 - 99 mg/dL   BUN 15 6 - 23 mg/dL   Creatinine, Ser 9.42 0.40 - 1.50 mg/dL   Total Bilirubin 0.5 0.2 - 1.2 mg/dL   Alkaline Phosphatase 80 39 - 117 U/L   AST 17 0 - 37 U/L   ALT 18 0 - 53 U/L   Total Protein 6.5 6.0 - 8.3 g/dL   Albumin 4.4 3.5 - 5.2 g/dL   GFR 01.45 >39.99 mL/min   Calcium  9.4 8.4 - 10.5 mg/dL  Fructosamine   Collection Time: 01/20/24  4:31 PM  Result Value Ref  Range   Fructosamine 279 205 - 285 umol/L      05/23/2024   11:58 AM 04/21/2024    9:14 AM 12/07/2023    3:57 PM 11/09/2023    2:07 PM 09/11/2023    7:37 AM  Depression screen PHQ 2/9  Decreased Interest 0 0 0 0 0  Down, Depressed, Hopeless 0 0 0 0 0  PHQ - 2 Score 0 0 0 0 0  Altered sleeping 0 0  2   Tired, decreased energy 0 0  0   Change in appetite 0 0  0   Feeling bad or failure about yourself  0 0  0   Trouble concentrating 0 0  0   Moving slowly or fidgety/restless 0 0  0   Suicidal thoughts 0 0  0   PHQ-9 Score 0 0  2   Difficult doing work/chores Not difficult at all Not difficult at all  Not difficult at all        05/23/2024   11:58 AM 12/07/2023    3:57 PM 11/09/2023    2:07 PM 09/11/2023    7:37 AM  GAD 7 : Generalized Anxiety Score  Nervous, Anxious, on Edge 0 0 0 0  Control/stop worrying 0 0 0 0  Worry too much - different things 0 0 0 0  Trouble relaxing 0 0 0 0  Restless 0 0 0 0  Easily annoyed or irritable 0 0 0 0  Afraid - awful might happen 0 0 0 0  Total GAD 7 Score 0 0 0 0  Anxiety Difficulty Not difficult at all  Not difficult at all    Assessment & Plan:   Problem List Items Addressed This Visit     Health maintenance examination - Primary (Chronic)   Preventative protocols reviewed and updated unless pt declined. Discussed healthy diet and lifestyle.       Advanced directives, counseling/discussion (Chronic)   Has this at home. Asked to bring us  copy       Hypothyroidism   Continue levothyroxine . Update TSH.       Relevant Medications   levothyroxine  (SYNTHROID ) 88 MCG tablet   Other Relevant Orders   TSH   Type 2 diabetes mellitus with other specified complication (HCC)   Chronic, latest fructosamine eqiuvalent to A1c of 6.8%.  Continue current medications. Reviewed importance of strength training to maintain muscle mass with ozempic  use.  Significant weight loss noted - he attributes to healthy changes to date. Will update labs and  discussed possibly dropping ozempic  dose pending updated  A1c.       Relevant Medications   glipiZIDE  (GLUCOTROL  XL) 5 MG 24 hr tablet   metFORMIN  (GLUCOPHAGE ) 1000 MG tablet   Other Relevant Orders   Microalbumin / creatinine urine ratio   Hemoglobin A1c   Vitamin B12   Hyperlipidemia associated with type 2 diabetes mellitus (HCC)   Chronic, continues atorvastatin  40mg  daily. Strong fmhx CAD.  High calcium  score 1061 (86%). New LDL goal <55 Lp(a) 18.5 (06/2023)  The 10-year ASCVD risk score (Arnett DK, et al., 2019) is: 26.1%   Values used to calculate the score:     Age: 65 years     Clincally relevant sex: Male     Is Non-Hispanic African American: No     Diabetic: Yes     Tobacco smoker: No     Systolic Blood Pressure: 122 mmHg     Is BP treated: No     HDL Cholesterol: 52.7 mg/dL     Total Cholesterol: 132 mg/dL       Relevant Medications   glipiZIDE  (GLUCOTROL  XL) 5 MG 24 hr tablet   metFORMIN  (GLUCOPHAGE ) 1000 MG tablet   Other Relevant Orders   Lipid panel   Comprehensive metabolic panel with GFR   BMI 75.9-75.0, adult   History of colonic polyps   Overdue for colonoscopy - will refer to [facility].      S/P total knee arthroplasty, right   Family history of premature CAD   Agatston coronary artery calcium  score greater than 400   Established with cardiology. Remains asxs from cardiac standpoint. Continue statin, aspirin .       Atherosclerosis of aorta (HCC)   Continue statin.       S/P total knee arthroplasty, left   Other Visit Diagnoses       Special screening for malignant neoplasms, colon       Relevant Orders   Ambulatory referral to Gastroenterology     Special screening for malignant neoplasm of prostate       Relevant Orders   PSA     Need for vaccination against Streptococcus pneumoniae       Relevant Orders   Pneumococcal conjugate vaccine 20-valent (Prevnar 20)        Meds ordered this encounter  Medications  . glipiZIDE  (GLUCOTROL  XL)  5 MG 24 hr tablet    Sig: Take 1 tablet (5 mg total) by mouth daily with breakfast.    Dispense:  90 tablet    Refill:  3  . levothyroxine  (SYNTHROID ) 88 MCG tablet    Sig: Take 1 tablet (88 mcg total) by mouth daily before breakfast.    Dispense:  90 tablet    Refill:  3  . metFORMIN  (GLUCOPHAGE ) 1000 MG tablet    Sig: Take 1 tablet (1,000 mg total) by mouth 2 (two) times daily with a meal.    Dispense:  180 tablet    Refill:  3    Orders Placed This Encounter  Procedures  . Pneumococcal conjugate vaccine 20-valent (Prevnar 20)  . Microalbumin / creatinine urine ratio  . Hemoglobin A1c    Standing Status:   Future    Expiration Date:   05/23/2025  . Lipid panel    Standing Status:   Future    Expiration Date:   05/23/2025  . Comprehensive metabolic panel with GFR    Standing Status:   Future    Expiration Date:   05/23/2025  . TSH    Standing Status:   Future  Expiration Date:   05/23/2025  . PSA    Standing Status:   Future    Expiration Date:   05/23/2025  . Vitamin B12    Standing Status:   Future    Expiration Date:   05/23/2025  . Ambulatory referral to Gastroenterology    Referral Priority:   Routine    Referral Type:   Consultation    Referral Reason:   Specialty Services Required    Number of Visits Requested:   1    Patient Instructions  Prevnar-20 today  Return at your convenience for fasting labs - ordered today  I have referred you to [facility] to discuss updated colonoscopy. You may call them at [phone].   Call heart doctor to schedule appt 08/2024.  Medicines refilled today.  Bring us  copy of living will.  Return as needed or in 6 months for diabetes follow up visit   Follow up plan: Return in about 6 months (around 11/20/2024) for follow up visit.  Anton Blas, MD

## 2024-05-23 NOTE — Assessment & Plan Note (Signed)
 Preventative protocols reviewed and updated unless pt declined. Discussed healthy diet and lifestyle.

## 2024-05-23 NOTE — Patient Instructions (Addendum)
 Prevnar-20 today  Return at your convenience for fasting labs - ordered today  I have referred you to [facility] to discuss updated colonoscopy. You may call them at [phone].   Call heart doctor to schedule appt 08/2024.  Medicines refilled today.  Bring us  copy of living will.  Return as needed or in 6 months for diabetes follow up visit

## 2024-05-23 NOTE — Assessment & Plan Note (Signed)
 Has this at home. Asked to bring [place] copy.

## 2024-05-23 NOTE — Assessment & Plan Note (Signed)
 Continue statin

## 2024-05-23 NOTE — Assessment & Plan Note (Addendum)
 Chronic, continues atorvastatin  40mg  daily. Strong fmhx CAD.  High calcium  score 1061 (86%). New LDL goal <55 Lp(a) 18.5 (06/2023)  The 10-year ASCVD risk score (Arnett DK, et al., 2019) is: 26.1%   Values used to calculate the score:     Age: 73 years     Clincally relevant sex: Male     Is Non-Hispanic African American: No     Diabetic: Yes     Tobacco smoker: No     Systolic Blood Pressure: 122 mmHg     Is BP treated: No     HDL Cholesterol: 52.7 mg/dL     Total Cholesterol: 132 mg/dL

## 2024-05-23 NOTE — Assessment & Plan Note (Addendum)
 Overdue for colonoscopy - will refer to [facility].

## 2024-05-25 ENCOUNTER — Other Ambulatory Visit (INDEPENDENT_AMBULATORY_CARE_PROVIDER_SITE_OTHER)

## 2024-05-25 ENCOUNTER — Ambulatory Visit: Payer: Self-pay | Admitting: Family Medicine

## 2024-05-25 DIAGNOSIS — Z125 Encounter for screening for malignant neoplasm of prostate: Secondary | ICD-10-CM

## 2024-05-25 DIAGNOSIS — E785 Hyperlipidemia, unspecified: Secondary | ICD-10-CM

## 2024-05-25 DIAGNOSIS — E1169 Type 2 diabetes mellitus with other specified complication: Secondary | ICD-10-CM | POA: Diagnosis not present

## 2024-05-25 DIAGNOSIS — E039 Hypothyroidism, unspecified: Secondary | ICD-10-CM

## 2024-05-25 LAB — PSA: PSA: 0.48 ng/mL (ref 0.10–4.00)

## 2024-05-25 LAB — COMPREHENSIVE METABOLIC PANEL WITH GFR
ALT: 15 U/L (ref 0–53)
AST: 18 U/L (ref 0–37)
Albumin: 4.8 g/dL (ref 3.5–5.2)
Alkaline Phosphatase: 99 U/L (ref 39–117)
BUN: 24 mg/dL — ABNORMAL HIGH (ref 6–23)
CO2: 26 meq/L (ref 19–32)
Calcium: 10.5 mg/dL (ref 8.4–10.5)
Chloride: 104 meq/L (ref 96–112)
Creatinine, Ser: 0.63 mg/dL (ref 0.40–1.50)
GFR: 95.38 mL/min (ref >60.00)
Glucose, Bld: 179 mg/dL — ABNORMAL HIGH (ref 70–99)
Potassium: 4.5 meq/L (ref 3.5–5.1)
Sodium: 141 meq/L (ref 135–145)
Total Bilirubin: 0.4 mg/dL (ref 0.2–1.2)
Total Protein: 7.3 g/dL (ref 6.0–8.3)

## 2024-05-25 LAB — HEMOGLOBIN A1C: Hgb A1c MFr Bld: 7.6 % — ABNORMAL HIGH (ref 4.6–6.5)

## 2024-05-25 LAB — LIPID PANEL
Cholesterol: 109 mg/dL (ref 0–200)
HDL: 49.6 mg/dL (ref >39.00)
LDL Cholesterol: 49 mg/dL (ref 0–99)
NonHDL: 59.14
Total CHOL/HDL Ratio: 2
Triglycerides: 50 mg/dL (ref 0.0–149.0)
VLDL: 10 mg/dL (ref 0.0–40.0)

## 2024-05-25 LAB — VITAMIN B12: Vitamin B-12: 511 pg/mL (ref 211–911)

## 2024-05-25 LAB — TSH: TSH: 3.05 u[IU]/mL (ref 0.35–5.50)

## 2024-05-26 ENCOUNTER — Other Ambulatory Visit: Payer: Self-pay | Admitting: Cardiology

## 2024-05-26 DIAGNOSIS — E113393 Type 2 diabetes mellitus with moderate nonproliferative diabetic retinopathy without macular edema, bilateral: Secondary | ICD-10-CM | POA: Diagnosis not present

## 2024-05-26 MED ORDER — GABAPENTIN 100 MG PO CAPS: 100.0000 mg | ORAL_CAPSULE | Freq: Every day | ORAL | 6 refills | Status: AC

## 2024-05-26 MED ORDER — SEMAGLUTIDE (2 MG/DOSE) 8 MG/3ML ~~LOC~~ SOPN: 2.0000 mg | PEN_INJECTOR | SUBCUTANEOUS | 3 refills | Status: AC

## 2024-05-26 NOTE — Assessment & Plan Note (Signed)
 Continue gabapentin.

## 2024-05-27 DIAGNOSIS — R600 Localized edema: Secondary | ICD-10-CM | POA: Diagnosis not present

## 2024-05-27 DIAGNOSIS — M25562 Pain in left knee: Secondary | ICD-10-CM | POA: Diagnosis not present

## 2024-06-02 DIAGNOSIS — M25562 Pain in left knee: Secondary | ICD-10-CM | POA: Diagnosis not present

## 2024-06-02 DIAGNOSIS — R600 Localized edema: Secondary | ICD-10-CM | POA: Diagnosis not present

## 2024-06-09 NOTE — Progress Notes (Signed)
 Fran Sliwa Jr.                                          MRN: [id number]   06/09/2024   The [facility] Quality Team Specialist reviewed this patient medical record for the purposes of chart review for care gap closure. The following were reviewed: abstraction for care gap closure-kidney health evaluation for diabetes:eGFR  and uACR.    [facility]BCI Quality Team

## 2024-06-10 DIAGNOSIS — M25562 Pain in left knee: Secondary | ICD-10-CM | POA: Diagnosis not present

## 2024-06-10 DIAGNOSIS — R600 Localized edema: Secondary | ICD-10-CM | POA: Diagnosis not present

## 2024-06-15 DIAGNOSIS — M25462 Effusion, left knee: Secondary | ICD-10-CM | POA: Diagnosis not present

## 2024-06-15 DIAGNOSIS — Z96652 Presence of left artificial knee joint: Secondary | ICD-10-CM | POA: Diagnosis not present

## 2024-06-15 DIAGNOSIS — M25562 Pain in left knee: Secondary | ICD-10-CM | POA: Diagnosis not present

## 2024-06-15 DIAGNOSIS — Z471 Aftercare following joint replacement surgery: Secondary | ICD-10-CM | POA: Diagnosis not present

## 2024-07-20 DIAGNOSIS — M25562 Pain in left knee: Secondary | ICD-10-CM | POA: Diagnosis not present

## 2024-07-20 DIAGNOSIS — Z96652 Presence of left artificial knee joint: Secondary | ICD-10-CM | POA: Diagnosis not present

## 2024-08-16 ENCOUNTER — Telehealth: Payer: Self-pay | Admitting: Gastroenterology

## 2024-08-16 NOTE — Telephone Encounter (Signed)
Lvm for patient to call back to schedule

## 2024-08-16 NOTE — Telephone Encounter (Signed)
 Colonoscopy reviewed, he is due for a routine surveillance colonoscopy for history of polyps back in 2019 (he had 2 adenomas and 1 sessile serrated polyp removed at that time).  Can direct book him for colonoscopy in the [facility] if he is otherwise a candidate for the [facility].  If he has specific symptoms he wants addressed, then he would otherwise warrant a clinic visit first.  Up to him if he wants to book directly for colonoscopy or if he wants to see us  in the clinic first. Thanks

## 2024-08-16 NOTE — Telephone Encounter (Signed)
 Good afternoon Dr. Leigh,   DOD for today 12/2 PM   Patient called stating that his PCP Dr. Rilla had sent over a referral to our practice to have a colonoscopy. After looking at patients chart, he had his last colonoscopy in December of 2019 with Dr. Jinny. Patient states he is requesting transfer due to his PCP sending the referral. Patients records are under the procedure tab.  Will you please review and advise on scheduling patient?  Thank you.

## 2024-08-17 ENCOUNTER — Encounter: Payer: Self-pay | Admitting: Gastroenterology

## 2024-08-31 ENCOUNTER — Other Ambulatory Visit: Payer: Self-pay | Admitting: Cardiology

## 2024-09-05 ENCOUNTER — Other Ambulatory Visit: Payer: Self-pay | Admitting: Family Medicine

## 2024-09-05 DIAGNOSIS — E1169 Type 2 diabetes mellitus with other specified complication: Secondary | ICD-10-CM

## 2024-09-05 MED ORDER — FREESTYLE LITE TEST VI STRP: ORAL_STRIP | 3 refills | Status: AC

## 2024-09-05 NOTE — Telephone Encounter (Signed)
 Copied from CRM #[id number]. Topic: Clinical - Medication Refill >> Sep 05, 2024  7:49 AM Victoria A wrote: Medication: glucose blood (FREESTYLE LITE) test strip  Has the patient contacted their pharmacy? No (Agent: If no, request that the patient contact the pharmacy for the refill. If patient does not wish to contact the pharmacy document the reason why and proceed with request.) (Agent: If yes, when and what did the pharmacy advise?)  This is the patient's preferred pharmacy:  [facility] - [name], [place] - [address] [place] [address] Phone: [phone] Fax: [phone]   Is this the correct pharmacy for this prescription? Yes If no, delete pharmacy and type the correct one.   Has the prescription been filled recently? No  Is the patient out of the medication? No Has test strips   Has the patient been seen for an appointment in the last year OR does the patient have an upcoming appointment? Yes  Can we respond through MyChart? Yes  Agent: Please be advised that Rx refills may take up to 3 business days. We ask that you follow-up with your pharmacy.

## 2024-09-06 NOTE — Progress Notes (Signed)
 " Cardiology Office Note   Date:  09/07/2024  ID:  Kevin Nobrega., DOB 04/07/1951, MRN [id number] PCP: Rilla Baller, MD  [facility] Providers Cardiologist:  None     History of Present Illness Kevin Murnane. is a 73 y.o. male with history of CAD found on CT, hyperlipidemia, hypothyroidism, and T2DM.    He was first evaluated by Dr.Agbor-Etang 06/2023 in the setting of elevated calcium  score. CT cardiac scoring 04/22/2023 revealed calcium  score 1061 (86th percentile). He has family history of premature CAD with father having MI in his late 32s, and 2 brothers with CAD, and 1 having bypass. Echo 11//2024 LVEF 50 to 55%, no RWMA, normal RV size/function, mild MR. He is very active and enjoys swimming, using elliptical, and walking 5 days a week.    He presents today for yearly follow-up in the setting of CAD.  He is celebrating his retirement yesterday and is having [place]  vacation with family.  He is very active with swimming, weight training, and walking.  He had knee replacement June/2025 and is unable to run like he used to. He denies CP, SOB, palpitations, edema, syncopal episodes, and hospital visits.  ROS: All systems negative for ROS unless noted in HPI.    Studies Reviewed EKG Interpretation Date/Time:  Wednesday September 07 2024 09:11:38 EST Ventricular Rate:  69 PR Interval:  148 QRS Duration:  94 QT Interval:  382 QTC Calculation: 409 R Axis:   -68  Text Interpretation: Normal sinus rhythm Left anterior fascicular block When compared with ECG of 31-Aug-2023 15:34, No significant change was found Confirmed by Teresa Fish [phone]) on 09/07/2024 9:16:17 AM    Cardiac Studies & Procedures   ______________________________________________________________________________________________     ECHOCARDIOGRAM  ECHOCARDIOGRAM COMPLETE 07/20/2023  Narrative ECHOCARDIOGRAM REPORT    Patient Name:   Kevin Chabot. Date of Exam: 07/20/2023 Medical Rec #:   [id number]       Height:       64.0 in Accession #:    [id number]      Weight:       166.4 lb Date of Birth:  Feb 13, 1951       BSA:          1.809 m Patient Age:    72 years        BP:           110/76 mmHg Patient Gender: M               HR:           68 bpm. Exam Location:  [place]  Procedure: 2D Echo, 3D Echo, Cardiac Doppler, Color Doppler and Strain Analysis  Indications:    I25.110 Atherosclerotic heart disease of native coronary artery with unstable angina pectoris  History:        Patient has no prior history of Echocardiogram examinations. CAD; Risk Factors:Family History of Coronary Artery Disease, Diabetes, Dyslipidemia and Non-Smoker.  Sonographer:    Doyal Point MHA, BS, RDCS Referring Phys: [id number] BRIAN AGBOR-ETANG  IMPRESSIONS   1. Left ventricular ejection fraction, by estimation, is 50 to 55%. The left ventricle has low normal function. The left ventricle has no regional wall motion abnormalities. Left ventricular diastolic parameters were normal. 2. Right ventricular systolic function is normal. The right ventricular size is normal. 3. The mitral valve is normal in structure. Mild mitral valve regurgitation. 4. The aortic valve is tricuspid. Aortic valve regurgitation is not visualized. 5. The inferior vena cava is normal  in size with greater than 50% respiratory variability, suggesting right atrial pressure of 3 mmHg.  FINDINGS Left Ventricle: Left ventricular ejection fraction, by estimation, is 50 to 55%. The left ventricle has low normal function. The left ventricle has no regional wall motion abnormalities. Global longitudinal strain performed but not reported based on interpreter judgement due to suboptimal tracking. The left ventricular internal cavity size was normal in size. There is no left ventricular hypertrophy. Left ventricular diastolic parameters were normal.  Right Ventricle: The right ventricular size is normal. No increase in right  ventricular wall thickness. Right ventricular systolic function is normal.  Left Atrium: Left atrial size was normal in size.  Right Atrium: Right atrial size was normal in size.  Pericardium: There is no evidence of pericardial effusion.  Mitral Valve: The mitral valve is normal in structure. Mild mitral valve regurgitation.  Tricuspid Valve: The tricuspid valve is normal in structure. Tricuspid valve regurgitation is mild.  Aortic Valve: The aortic valve is tricuspid. Aortic valve regurgitation is not visualized.  Pulmonic Valve: The pulmonic valve was normal in structure. Pulmonic valve regurgitation is trivial.  Aorta: The aortic root is normal in size and structure.  Venous: The inferior vena cava is normal in size with greater than 50% respiratory variability, suggesting right atrial pressure of 3 mmHg.  IAS/Shunts: No atrial level shunt detected by color flow Doppler.   LEFT VENTRICLE PLAX 2D LVIDd:         4.90 cm Diastology LVIDs:         3.30 cm LV e' medial:    8.27 cm/s LV PW:         0.80 cm LV E/e' medial:  7.6 LV IVS:        0.70 cm LV e' lateral:   9.90 cm/s LV E/e' lateral: 6.4  3D Volume EF: 3D EF:        44 % LV EDV:       160 ml LV ESV:       90 ml LV SV:        70 ml  RIGHT VENTRICLE RV Basal diam:  3.40 cm RV Mid diam:    3.40 cm RV S prime:     11.60 cm/s TAPSE (M-mode): 1.8 cm  LEFT ATRIUM             Index        RIGHT ATRIUM           Index LA diam:        4.00 cm 2.21 cm/m   RA Area:     16.30 cm LA Vol (A2C):   48.5 ml 26.81 ml/m  RA Volume:   42.40 ml  23.43 ml/m LA Vol (A4C):   41.5 ml 22.94 ml/m LA Biplane Vol: 46.0 ml 25.42 ml/m  AORTA Ao Asc diam: 3.40 cm  MITRAL VALVE MV Area (PHT): 2.95 cm MV Decel Time: 257 msec MV E velocity: 62.90 cm/s MV A velocity: 73.90 cm/s MV E/A ratio:  0.85  Redell Cave MD Electronically signed by Redell Cave MD Signature Date/Time: 07/21/2023/10:29:17 AM    Final       CT SCANS  CT CARDIAC SCORING (SELF PAY ONLY) 04/22/2023  Addendum 04/29/2023 10:16 AM ADDENDUM REPORT: 04/29/2023 10:14  EXAM: OVER-READ INTERPRETATION  CT CHEST  The following report is an over-read performed by radiologist Dr. Andrea Gasman of [facility] on 04/29/2023. This over-read does not include interpretation of cardiac or coronary anatomy or pathology.  The coronary calcium  score interpretation by the cardiologist is attached.  COMPARISON:  None.  FINDINGS: Vascular: Aortic atherosclerosis. The included aorta is normal in caliber.  Mediastinum/nodes: No adenopathy or mass. Unremarkable esophagus.  Lungs: No focal airspace disease. No pulmonary nodule. No pleural fluid. The included airways are patent.  Upper abdomen: No acute or unexpected findings.  Musculoskeletal: There are no acute or suspicious osseous abnormalities.  IMPRESSION: Aortic Atherosclerosis (ICD10-I70.0).   Electronically Signed By: Andrea Gasman M.D. On: 04/29/2023 10:14  Narrative CLINICAL DATA:  Risk stratification  EXAM: Coronary Calcium  Score  TECHNIQUE: The patient was scanned on a Siemens Somatom scanner. Axial non-contrast 3 mm slices were carried out through the heart. The data set was analyzed on a dedicated work station and scored using the Agatson method.  FINDINGS: Non-cardiac: See separate report from [facility].  Ascending Aorta: Normal size, aortic wall atherosclerosis  Pericardium: Normal  Coronary arteries: Normal origin of left and right coronary arteries. Distribution of arterial calcifications if present, as noted below;  LM 0  LAD 560  LCx 398  RCA 103  Total 1061  IMPRESSION AND RECOMMENDATION: 1. Coronary calcium  score of 1061. This was 86th percentile for age and sex matched control.  2. CAC >300 in LAD, LCx, RCA. CAC-DRS A3/N3  3. Recommend aspirin  and statin if no contraindication.  4. Recommend cardiology  consultation.  5. Continue heart healthy lifestyle and risk factor modification.  Electronically Signed: By: Redell Cave M.D. On: 04/22/2023 18:27     ______________________________________________________________________________________________      Risk Assessment/Calculations           Physical Exam VS:  BP (!) 100/57 (BP Location: Left Arm, Patient Position: Sitting, Cuff Size: Normal)   Pulse 69   Ht 5' 4 (1.626 m)   Wt 156 lb (70.8 kg)   SpO2 96%   BMI 26.78 kg/m        Wt Readings from Last 3 Encounters:  09/07/24 156 lb (70.8 kg)  05/23/24 144 lb 6.4 oz (65.5 kg)  04/21/24 143 lb (64.9 kg)    GEN: Well nourished, well developed in no acute distress NECK: No JVD; No carotid bruits CARDIAC: RRR, no murmurs, rubs, gallops RESPIRATORY:  Clear to auscultation without rales, wheezing or rhonchi  ABDOMEN: Soft, non-tender, non-distended EXTREMITIES:  No edema; No deformity   ASSESSMENT AND PLAN   CAD found on CT- CT cardiac scoring 04/22/2023 revealed calcium  score 1061 (86th percentile). Echo 11//2024 LVEF 50 to 55%, no RWMA, normal RV size/function, mild MR. Today he denies edema, CP, SOB, palpitations, and DOE.  Continue atorvastatin  40 mm and aspirin  81 mg.    Hyperlipidemia - Lp(a) 18.5 07/01/23. Last LDL 49.6 05/25/24. Continue atorvastatin  40 mg.   LAFB - Today he reports no palpitations or SOB. EKG today remains unchanged.   T2DM - Last A1C 7.6 05/25/24. Continue following with PCP.     Dispo: Follow up in 12 months with MD or APP.   Signed, Mardy KATHEE Pizza, FNP  "

## 2024-09-07 ENCOUNTER — Encounter: Payer: Self-pay | Admitting: Student

## 2024-09-07 ENCOUNTER — Ambulatory Visit: Attending: Student

## 2024-09-07 VITALS — BP 100/57 | HR 69 | Ht 64.0 in | Wt 156.0 lb

## 2024-09-07 DIAGNOSIS — E782 Mixed hyperlipidemia: Secondary | ICD-10-CM | POA: Diagnosis not present

## 2024-09-07 DIAGNOSIS — E1169 Type 2 diabetes mellitus with other specified complication: Secondary | ICD-10-CM | POA: Diagnosis not present

## 2024-09-07 DIAGNOSIS — I444 Left anterior fascicular block: Secondary | ICD-10-CM

## 2024-09-07 DIAGNOSIS — Z8249 Family history of ischemic heart disease and other diseases of the circulatory system: Secondary | ICD-10-CM | POA: Diagnosis not present

## 2024-09-07 DIAGNOSIS — I2510 Atherosclerotic heart disease of native coronary artery without angina pectoris: Secondary | ICD-10-CM | POA: Diagnosis not present

## 2024-09-07 NOTE — Patient Instructions (Signed)
 Medication Instructions:  None ordered at this time  *If you need a refill on your cardiac medications before your next appointment, please call your pharmacy*  Lab Work: None ordered at this time   Follow-Up: At [facility], you and your health needs are our priority.  As part of our continuing mission to provide you with exceptional heart care, our providers are all part of one team.  This team includes your primary Cardiologist (physician) and Advanced Practice Providers or APPs (Physician Assistants and Nurse Practitioners) who all work together to provide you with the care you need, when you need it.  Your next appointment:   12 month(s)  Provider:   You may see Darliss Rogue, MD

## 2024-09-20 ENCOUNTER — Ambulatory Visit: Admitting: *Deleted

## 2024-09-20 VITALS — Ht 64.0 in | Wt 155.0 lb

## 2024-09-20 DIAGNOSIS — Z860100 Personal history of colon polyps, unspecified: Secondary | ICD-10-CM

## 2024-09-20 MED ORDER — NA SULFATE-K SULFATE-MG SULF 17.5-3.13-1.6 GM/177ML PO SOLN: 1.0000 | Freq: Once | ORAL | 0 refills | Status: AC

## 2024-09-20 NOTE — Progress Notes (Signed)
 Pt's name and DOB verified at the beginning of the pre-visit with 2 identifiers  Pt denies any difficulty with ambulating,sitting, laying down or rolling side to side  Pt has no issues moving head neck or swallowing  No egg or soy allergy known to patient   No issues known to pt with past sedation  No FH of Malignant Hyperthermia  Pt is not on home 02   Pt is not on blood thinners   Pt denies issues with constipation   Pt has frequent issues with constipation RN instructed pt to use Miralax per bottles instructions a week before prep days. Pt states they will  Pt is not on dialysis  Pt denise any abnormal heart rhythms   Pt denies any upcoming cardiac testing  Patient's chart reviewed by Norleen Schillings CNRA prior to pre-visit and patient appropriate for the [facility].  Pre-visit completed and red dot placed by patient's name on their procedure day (on provider's schedule).    Visit in person  Pt states weight is 155 lb  Pt given  both [facility] main # and MD on call # prior to instructions.  Informed pt to come in at the time discussed and is shown on PV instructions.  Pt instructed to use Single[employer].com or GoodRx for a price reduction on prep  Instructed pt where to find PV instructions in My Chart and copy given to pt Instructed pt on all aspects of written instructions including med holds clothing to wear and foods to eat and not eat as well as after procedure legal restrictions and to call MD on call if needed.. Pt states understanding. Instructed pt to review instructions again prior to procedure and call main # given if has any questions or any issues. Pt states they will.

## 2024-09-23 ENCOUNTER — Encounter: Payer: Self-pay | Admitting: Gastroenterology

## 2024-09-30 ENCOUNTER — Ambulatory Visit: Admitting: Gastroenterology

## 2024-09-30 ENCOUNTER — Encounter: Payer: Self-pay | Admitting: Gastroenterology

## 2024-09-30 VITALS — BP 115/72 | HR 56 | Temp 97.4°F | Resp 16 | Ht 64.0 in | Wt 155.0 lb

## 2024-09-30 DIAGNOSIS — Z860100 Personal history of colon polyps, unspecified: Secondary | ICD-10-CM

## 2024-09-30 DIAGNOSIS — Z1211 Encounter for screening for malignant neoplasm of colon: Secondary | ICD-10-CM

## 2024-09-30 DIAGNOSIS — K648 Other hemorrhoids: Secondary | ICD-10-CM

## 2024-09-30 DIAGNOSIS — D124 Benign neoplasm of descending colon: Secondary | ICD-10-CM | POA: Diagnosis not present

## 2024-09-30 DIAGNOSIS — Z860101 Personal history of adenomatous and serrated colon polyps: Secondary | ICD-10-CM

## 2024-09-30 DIAGNOSIS — K5730 Diverticulosis of large intestine without perforation or abscess without bleeding: Secondary | ICD-10-CM

## 2024-09-30 MED ORDER — SODIUM CHLORIDE 0.9 % IV SOLN: 500.0000 mL | Freq: Once | INTRAVENOUS | Status: DC

## 2024-09-30 NOTE — Progress Notes (Signed)
 Sedate, gd SR, tolerated procedure well, VSS, report to RN

## 2024-09-30 NOTE — Patient Instructions (Signed)
 Resume previous diet and medications. Awaiting pathology results.  Handouts provided on colon polyps, Diverticulosis and Hemorrhoids  YOU HAD AN ENDOSCOPIC PROCEDURE TODAY AT THE [facility]:   Refer to the procedure report that was given to you for any specific questions about what was found during the examination.  If the procedure report does not answer your questions, please call your gastroenterologist to clarify.  If you requested that your care partner not be given the details of your procedure findings, then the procedure report has been included in a sealed envelope for you to review at your convenience later.  YOU SHOULD EXPECT: Some feelings of bloating in the abdomen. Passage of more gas than usual.  Walking can help get rid of the air that was put into your GI tract during the procedure and reduce the bloating. If you had a lower endoscopy (such as a colonoscopy or flexible sigmoidoscopy) you may notice spotting of blood in your stool or on the toilet paper. If you underwent a bowel prep for your procedure, you may not have a normal bowel movement for a few days.  Please Note:  You might notice some irritation and congestion in your nose or some drainage.  This is from the oxygen used during your procedure.  There is no need for concern and it should clear up in a day or so.  SYMPTOMS TO REPORT IMMEDIATELY:  Following lower endoscopy (colonoscopy or flexible sigmoidoscopy):  Excessive amounts of blood in the stool  Significant tenderness or worsening of abdominal pains  Swelling of the abdomen that is new, acute  Fever of 100F or higher  For urgent or emergent issues, a gastroenterologist can be reached at any hour by calling [phone]. Do not use MyChart messaging for urgent concerns.    DIET:  We do recommend a small meal at first, but then you may proceed to your regular diet.  Drink plenty of fluids but you should avoid alcoholic beverages for 24  hours.  ACTIVITY:  You should plan to take it easy for the rest of today and you should NOT DRIVE or use heavy machinery until tomorrow (because of the sedation medicines used during the test).    FOLLOW UP: Our staff will call the number listed on your records the next business day following your procedure.  We will call around 7:15- 8:00 am to check on you and address any questions or concerns that you may have regarding the information given to you following your procedure. If we do not reach you, we will leave a message.     If any biopsies were taken you will be contacted by phone or by letter within the next 1-3 weeks.  Please call us  at [phone] if you have not heard about the biopsies in 3 weeks.    SIGNATURES/CONFIDENTIALITY: You and/or your care partner have signed paperwork which will be entered into your electronic medical record.  These signatures attest to the fact that that the information above on your After Visit Summary has been reviewed and is understood.  Full responsibility of the confidentiality of this discharge information lies with you and/or your care-partner.

## 2024-09-30 NOTE — Progress Notes (Signed)
 [facility] History and Physical   Primary Care Physician:  Rilla Baller, MD   Reason for Procedure:   History of colon polyps  Plan:    colonoscopy     HPI: Kevin Sheppard. is a 74 y.o. male  here for colonoscopy surveillance - last exam 09/2017 - 2 TAs, 1 SSP, per Dr. Jinny.   Patient denies any bowel symptoms at this time. No family history of colon cancer known. Otherwise feels well without any cardiopulmonary symptoms.   I have discussed risks / benefits of anesthesia and endoscopic procedure with Kevin Garrels Jr. and they wish to proceed with the exams as outlined today.   The patient was provided an opportunity to ask questions and all were answered. The patient agreed with the plan.    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   Undescended left testicle 1961   after trauma with dodgeball    Past Surgical History:  Procedure Laterality Date   COLONOSCOPY  2004;2009   first with b9 polyps, then diverticulosis no polyps, rpt 62yrs ([facility])   COLONOSCOPY WITH PROPOFOL  N/A 08/30/2018   2 TA, 1 SSP, diverticulosis, rpt 5 yrs Renne, Darren, MD)   KNEE CLOSED REDUCTION Right 01/01/2023   CLOSED MANIPULATION KNEE Sibyl Drivers, MD)   MENISCUS REPAIR Left 2008   MENISCUS REPAIR Right 2002   POLYPECTOMY N/A 08/30/2018   Procedure: POLYPECTOMY;  Surgeon: Jinny Carmine, MD;  Location: [facility];  Service: Endoscopy;  Laterality: N/A;   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 Authorizing Provider  aspirin  EC 81 MG tablet Take 1 tablet (81 mg total) by mouth daily. Swallow whole. 04/24/23  Yes Rilla Baller, MD  atorvastatin  (LIPITOR) 40 MG tablet TAKE 1 TABLET DAILY (CALL THE OFFICE TO MAKE ANNUAL APPOINTMENT FOR FURTHER REFILLS) 08/31/24  Yes Darliss Rogue, MD  Blood Glucose Monitoring Suppl (FREESTYLE LITE) w/Device KIT Use to check sugars daily and as needed E11.69 10/14/22  Yes Rilla Baller, MD  glipiZIDE  (GLUCOTROL  XL) 5 MG 24 hr tablet Take 1 tablet (5 mg total) by mouth daily with breakfast. 05/23/24  Yes Rilla Baller, MD  glucose blood (FREESTYLE LITE) test strip Use as instructed to check blood sugar 3 times a day 09/05/24  Yes Rilla Baller, MD  levothyroxine  (SYNTHROID ) 88 MCG tablet Take 1 tablet (88 mcg total) by mouth daily before breakfast. 05/23/24  Yes Rilla Baller, MD  metFORMIN  (GLUCOPHAGE ) 1000 MG tablet Take 1 tablet (1,000 mg total) by mouth 2 (two) times daily with a meal. 05/23/24  Yes Rilla Baller, MD  Multiple Vitamins-Minerals (MULTIVITAMIN ADULTS 50+ PO) Take 1 tablet by mouth daily.   Yes [provider]  Omega-3 Fatty Acids (FISH OIL PO) Take 1 tablet by mouth daily.   Yes [provider]  zinc gluconate 50 MG  tablet Take 50 mg by mouth daily.   Yes [provider]  cetirizine  (ZYRTEC ) 10 MG tablet Take 1 tablet (10 mg total) by mouth daily. Patient not taking: Reported on 09/20/2024 11/16/23   Cleatus Arlyss RAMAN, MD  gabapentin  (NEURONTIN ) 100 MG capsule Take 1-2 capsules (100-200 mg total) by mouth at bedtime. Patient not taking: Reported on 09/20/2024 05/26/24   Rilla Baller, MD  Semaglutide , 2 MG/DOSE, 8 MG/3ML SOPN Inject 2 mg as directed once a week. 05/26/24   Rilla Baller, MD  tadalafil  (CIALIS ) 10 MG tablet TAKE 1 TABLET BY MOUTH EVERY OTHER DAY AS  NEEDED FOR ERECTILE DYSFUNCTION Patient taking differently: as needed. TAKE 1 TABLET BY MOUTH EVERY OTHER DAY AS NEEDED FOR ERECTILE DYSFUNCTION 04/13/24   Rilla Baller, MD  traMADol  (ULTRAM ) 50 MG tablet Take 50 mg by mouth every 6 (six) hours as needed. for pain    [provider]  triamcinolone  cream (KENALOG ) 0.1 % Apply 1 Application topically 2 (two) times daily as needed. Patient not taking: Reported on 09/20/2024 01/20/24   Rilla Baller, MD  zolpidem  (AMBIEN ) 10 MG tablet TAKE 1 TABLET(10 MG) BY MOUTH AT BEDTIME AS NEEDED FOR SLEEP 03/25/23   Rilla Baller, MD    Current Outpatient Medications  Medication Sig Dispense Refill   aspirin  EC 81 MG tablet Take 1 tablet (81 mg total) by mouth daily. Swallow whole.     atorvastatin  (LIPITOR) 40 MG tablet TAKE 1 TABLET DAILY (CALL THE OFFICE TO MAKE ANNUAL APPOINTMENT FOR FURTHER REFILLS) 90 tablet 0   Blood Glucose Monitoring Suppl (FREESTYLE LITE) w/Device KIT Use to check sugars daily and as needed E11.69 1 kit 0   glipiZIDE  (GLUCOTROL  XL) 5 MG 24 hr tablet Take 1 tablet (5 mg total) by mouth daily with breakfast. 90 tablet 3   glucose blood (FREESTYLE LITE) test strip Use as instructed to check blood sugar 3 times a day 300 each 3   levothyroxine  (SYNTHROID ) 88 MCG tablet Take 1 tablet (88 mcg total) by mouth daily before breakfast. 90 tablet 3   metFORMIN  (GLUCOPHAGE ) 1000 MG tablet Take 1 tablet (1,000 mg total) by mouth 2 (two) times daily with a meal. 180 tablet 3   Multiple Vitamins-Minerals (MULTIVITAMIN ADULTS 50+ PO) Take 1 tablet by mouth daily.     Omega-3 Fatty Acids (FISH OIL PO) Take 1 tablet by mouth daily.     zinc gluconate 50 MG tablet Take 50 mg by mouth daily.     cetirizine  (ZYRTEC ) 10 MG tablet Take 1 tablet (10 mg total) by mouth daily. (Patient not taking: Reported on 09/20/2024)     gabapentin  (NEURONTIN ) 100 MG capsule Take 1-2 capsules (100-200 mg total) by mouth at bedtime. (Patient not taking:  Reported on 09/20/2024) 60 capsule 6   Semaglutide , 2 MG/DOSE, 8 MG/3ML SOPN Inject 2 mg as directed once a week. 9 mL 3   tadalafil  (CIALIS ) 10 MG tablet TAKE 1 TABLET BY MOUTH EVERY OTHER DAY AS NEEDED FOR ERECTILE DYSFUNCTION (Patient taking differently: as needed. TAKE 1 TABLET BY MOUTH EVERY OTHER DAY AS NEEDED FOR ERECTILE DYSFUNCTION) 10 tablet 6   traMADol  (ULTRAM ) 50 MG tablet Take 50 mg by mouth every 6 (six) hours as needed. for pain     triamcinolone  cream (KENALOG ) 0.1 % Apply 1 Application topically 2 (two) times daily as needed. (Patient not taking: Reported on 09/20/2024) 30 g 1   zolpidem  (AMBIEN ) 10 MG tablet TAKE 1 TABLET(10 MG) BY MOUTH AT  BEDTIME AS NEEDED FOR SLEEP 20 tablet 0   Current Facility-Administered Medications  Medication Dose Route Frequency Provider Last Rate Last Admin   0.9 %  sodium chloride  infusion  500 mL Intravenous Once Nasiir Monts, Elspeth SQUIBB, MD        Allergies as of 09/30/2024 - Review Complete 09/30/2024  Allergen Reaction Noted   Nervive nerve relief [vitamins for hair] Rash 11/16/2023   Pregabalin Rash and Dermatitis 08/16/2014    Family History  Problem Relation Age of Onset   Other Mother        blood disease, died 63 (?porphyria)   Heart disease Father    Stroke Father    Diabetes Father    Hypertension Sister    Stroke Sister 70   Thyroid  disease Sister    Heart disease Brother    Heart disease Brother    Diabetes type II Brother    Diabetes type II Maternal Aunt    Cancer Neg Hx    CAD Neg Hx    Colon polyps Neg Hx    Colon cancer Neg Hx    Esophageal cancer Neg Hx    Rectal cancer Neg Hx    Stomach cancer Neg Hx     Social History   Socioeconomic History   Marital status: Married    Spouse name: Maurilio   Number of children: 4   Years of education: Not on file   Highest education level: 12th grade  Occupational History   Occupation: advertising copywriter  Tobacco Use   Smoking status: Never   Smokeless tobacco: Never  Vaping  Use   Vaping status: Never Used  Substance and Sexual Activity   Alcohol use: Not Currently    Comment: occassional   Drug use: No   Sexual activity: Yes  Other Topics Concern   Not on file  Social History Narrative   He lives with wife.  2 dogs. They have grown 4 children and 3 grandchildren.    Retired Dentist), E6.    He works on cigarette machines, 3rd shift   Activity: goes to gym regularly   Diet: good water , fruits/vegetables daily   Social Drivers of Health   Tobacco Use: Low Risk (09/30/2024)   Patient History    Smoking Tobacco Use: Never    Smokeless Tobacco Use: Never    Passive Exposure: Not on file  Financial Resource Strain: Low Risk (05/19/2024)   Overall Financial Resource Strain (CARDIA)    Difficulty of Paying Living Expenses: Not very hard  Food Insecurity: No Food Insecurity (05/19/2024)   Epic    Worried About Radiation Protection Practitioner of Food in the Last Year: Never true    Ran Out of Food in the Last Year: Never true  Transportation Needs: No Transportation Needs (05/19/2024)   Epic    Lack of Transportation (Medical): No    Lack of Transportation (Non-Medical): No  Physical Activity: Sufficiently Active (05/19/2024)   Exercise Vital Sign    Days of Exercise per Week: 5 days    Minutes of Exercise per Session: 90 min  Stress: No Stress Concern Present (05/19/2024)   [employer] of Occupational Health - Occupational Stress Questionnaire    Feeling of Stress: Not at all  Social Connections: Moderately Isolated (05/19/2024)   Social Connection and Isolation Panel    Frequency of Communication with Friends and Family: More than three times a week    Frequency of Social Gatherings with Friends and Family: Once a week  Attends Religious Services: Patient declined    Active Member of Clubs or Organizations: No    Attends Banker Meetings: Not on file    Marital Status: Married  Intimate Partner Violence: Not At Risk (04/21/2024)   Epic    Fear  of Current or Ex-Partner: No    Emotionally Abused: No    Physically Abused: No    Sexually Abused: No  Depression (PHQ2-9): Low Risk (05/23/2024)   Depression (PHQ2-9)    PHQ-2 Score: 0  Alcohol Screen: Low Risk (05/19/2024)   Alcohol Screen    Last Alcohol Screening Score (AUDIT): 1  Housing: Unknown (05/19/2024)   Epic    Unable to Pay for Housing in the Last Year: No    Number of Times Moved in the Last Year: Not on file    Homeless in the Last Year: No  Utilities: Patient Unable To Answer (04/21/2024)   Epic    Threatened with loss of utilities: Patient unable to answer  Health Literacy: Adequate Health Literacy (04/21/2024)   B1300 Health Literacy    Frequency of need for help with medical instructions: Never    Review of Systems: All other review of systems negative except as mentioned in the HPI.  Physical Exam: Vital signs BP 110/71   Pulse (!) 53   Temp (!) 97.4 F (36.3 C)   Ht 5' 4 (1.626 m)   Wt 155 lb (70.3 kg)   SpO2 98%   BMI 26.61 kg/m   General:   Alert,  Well-developed, pleasant and cooperative in NAD Lungs:  Clear throughout to auscultation.   Heart:  Regular rate and rhythm Abdomen:  Soft, nontender and nondistended.   Neuro/Psych:  Alert and cooperative. Normal mood and affect. A and O x 3  Marcey Naval, MD [facility]

## 2024-09-30 NOTE — Progress Notes (Signed)
 Pt's states no medical or surgical changes since previsit or office visit.

## 2024-09-30 NOTE — Op Note (Signed)
 [facility] Patient Name: Kevin Sheppard Procedure Date: 09/30/2024 11:26 AM MRN: [id number] Endoscopist: Elspeth P. Leigh , MD, [id number] Age: 74 Referring MD:  Date of Birth: 08/26/1951 Gender: Male Account #: [id number] Procedure:                Colonoscopy Indications:              High risk colon cancer surveillance: Personal                            history of colonic polyps - last exam 2019 - 2                            adenomas / 1 sessile serrated polyp Medicines:                Monitored Anesthesia Care Procedure:                Pre-Anesthesia Assessment:                           - Prior to the procedure, a History and Physical                            was performed, and patient medications and                            allergies were reviewed. The patient's tolerance of                            previous anesthesia was also reviewed. The risks                            and benefits of the procedure and the sedation                            options and risks were discussed with the patient.                            All questions were answered, and informed consent                            was obtained. Prior Anticoagulants: The patient has                            taken no anticoagulant or antiplatelet agents. ASA                            Grade Assessment: II - A patient with mild systemic                            disease. After reviewing the risks and benefits,                            the patient was deemed in satisfactory condition to  undergo the procedure.                           After obtaining informed consent, the colonoscope                            was passed under direct vision. Throughout the                            procedure, the patient's blood pressure, pulse, and                            oxygen saturations were monitored continuously. The                            Olympus Scope SN: [id number]  was introduced through                            the anus and advanced to the the cecum, identified                            by appendiceal orifice and ileocecal valve. The                            colonoscopy was performed without difficulty. The                            patient tolerated the procedure well. The quality                            of the bowel preparation was adequate. The                            ileocecal valve, appendiceal orifice, and rectum                            were photographed. Scope In: 11:34:00 AM Scope Out: 11:56:51 AM Scope Withdrawal Time: 0 hours 18 minutes 7 seconds  Total Procedure Duration: 0 hours 22 minutes 51 seconds  Findings:                 The perianal and digital rectal examinations were                            normal.                           A large amount of retained seeds / nutes was found                            in the entire colon, worst in the right colon /                            cecum, making visualization difficult there. Lavage  of these areas was performed using copious amounts                            of fluid, resulting in clearance with adequate                            visualization.                           A 4 mm polyp was found in the descending colon. The                            polyp was sessile. The polyp was removed with a                            cold snare. Resection and retrieval were complete.                           Multiple small-mouthed diverticula were found in                            the sigmoid colon.                           Internal hemorrhoids were found during                            retroflexion. The hemorrhoids were small.                           The exam was otherwise without abnormality. Complications:            No immediate complications. Estimated blood loss:                            Minimal. Estimated Blood Loss:     Estimated  blood loss was minimal. Impression:               - One 4 mm polyp in the descending colon, removed                            with a cold snare. Resected and retrieved.                           - Diverticulosis in the sigmoid colon.                           - Internal hemorrhoids.                           - The examination was otherwise normal. Recommendation:           - Patient has a contact number available for                            emergencies. The signs and symptoms of potential  delayed complications were discussed with the                            patient. Return to normal activities tomorrow.                            Written discharge instructions were provided to the                            patient.                           - Resume previous diet.                           - Continue present medications.                           - Await pathology results. Elspeth P. Loukisha Gunnerson, MD 09/30/2024 12:03:22 PM This report has been signed electronically.

## 2024-10-03 ENCOUNTER — Telehealth: Payer: Self-pay | Admitting: Lactation Services

## 2024-10-03 NOTE — Telephone Encounter (Signed)
 No answer left voice mail

## 2024-10-04 ENCOUNTER — Ambulatory Visit: Payer: Self-pay | Admitting: Gastroenterology

## 2024-10-04 LAB — SURGICAL PATHOLOGY

## 2024-11-21 ENCOUNTER — Ambulatory Visit: Admitting: Family Medicine

## 2024-11-23 ENCOUNTER — Ambulatory Visit: Admitting: Family Medicine

## 2025-04-25 ENCOUNTER — Ambulatory Visit

## 2025-04-26 ENCOUNTER — Ambulatory Visit
